# Patient Record
Sex: Male | Born: 1965 | Race: Black or African American | Hispanic: No | Marital: Married | Smoking: Former smoker
Health system: Southern US, Community
[De-identification: ages and names within clinical notes are randomized; demographics above are authoritative.]

## PROBLEM LIST (undated history)

## (undated) DIAGNOSIS — E781 Pure hyperglyceridemia: Secondary | ICD-10-CM

## (undated) DIAGNOSIS — K219 Gastro-esophageal reflux disease without esophagitis: Secondary | ICD-10-CM

## (undated) DIAGNOSIS — M1990 Unspecified osteoarthritis, unspecified site: Secondary | ICD-10-CM

## (undated) DIAGNOSIS — I10 Essential (primary) hypertension: Secondary | ICD-10-CM

## (undated) DIAGNOSIS — I2510 Atherosclerotic heart disease of native coronary artery without angina pectoris: Secondary | ICD-10-CM

## (undated) DIAGNOSIS — K859 Acute pancreatitis without necrosis or infection, unspecified: Secondary | ICD-10-CM

## (undated) DIAGNOSIS — I739 Peripheral vascular disease, unspecified: Secondary | ICD-10-CM

## (undated) DIAGNOSIS — E119 Type 2 diabetes mellitus without complications: Secondary | ICD-10-CM

## (undated) HISTORY — DX: Type 2 diabetes mellitus without complications: E11.9

## (undated) HISTORY — PX: NO PAST SURGERIES: SHX2092

## (undated) HISTORY — DX: Peripheral vascular disease, unspecified: I73.9

## (undated) HISTORY — DX: Pure hyperglyceridemia: E78.1

## (undated) HISTORY — DX: Atherosclerotic heart disease of native coronary artery without angina pectoris: I25.10

## (undated) HISTORY — DX: Essential (primary) hypertension: I10

---

## 2012-11-16 ENCOUNTER — Encounter (HOSPITAL_COMMUNITY): Payer: Self-pay | Admitting: *Deleted

## 2012-11-16 ENCOUNTER — Emergency Department (HOSPITAL_COMMUNITY)
Admission: EM | Admit: 2012-11-16 | Discharge: 2012-11-16 | Disposition: A | Discharge status: Home / Self Care | Payer: Self-pay | Attending: Emergency Medicine | Admitting: Emergency Medicine

## 2012-11-16 DIAGNOSIS — K859 Acute pancreatitis without necrosis or infection, unspecified: Secondary | ICD-10-CM | POA: Insufficient documentation

## 2012-11-16 DIAGNOSIS — F17200 Nicotine dependence, unspecified, uncomplicated: Secondary | ICD-10-CM | POA: Insufficient documentation

## 2012-11-16 DIAGNOSIS — R1110 Vomiting, unspecified: Secondary | ICD-10-CM | POA: Insufficient documentation

## 2012-11-16 HISTORY — DX: Acute pancreatitis without necrosis or infection, unspecified: K85.90

## 2012-11-16 LAB — COMPREHENSIVE METABOLIC PANEL
AST: 48 U/L — ABNORMAL HIGH (ref 0–37)
Albumin: 3.8 g/dL (ref 3.5–5.2)
Chloride: 96 mEq/L (ref 96–112)
Creatinine, Ser: 0.79 mg/dL (ref 0.50–1.35)
Sodium: 130 mEq/L — ABNORMAL LOW (ref 135–145)
Total Bilirubin: 0.5 mg/dL (ref 0.3–1.2)

## 2012-11-16 LAB — CBC WITH DIFFERENTIAL/PLATELET
Basophils Relative: 0 % (ref 0–1)
Eosinophils Absolute: 0.1 10*3/uL (ref 0.0–0.7)
HCT: 41.6 % (ref 39.0–52.0)
Hemoglobin: 14.9 g/dL (ref 13.0–17.0)
Lymphs Abs: 2.9 10*3/uL (ref 0.7–4.0)
MCH: 28.2 pg (ref 26.0–34.0)
MCHC: 35.8 g/dL (ref 30.0–36.0)
MCV: 78.6 fL (ref 78.0–100.0)
Monocytes Absolute: 0.7 10*3/uL (ref 0.1–1.0)
Neutro Abs: 6 10*3/uL (ref 1.7–7.7)

## 2012-11-16 LAB — URINALYSIS, MICROSCOPIC ONLY
Glucose, UA: NEGATIVE mg/dL
Ketones, ur: NEGATIVE mg/dL
Protein, ur: 30 mg/dL — AB

## 2012-11-16 LAB — TROPONIN I: Troponin I: <0.3 ng/mL (ref <0.30)

## 2012-11-16 LAB — LIPASE, BLOOD: Lipase: 58 U/L (ref 11–59)

## 2012-11-16 MED ORDER — OXYCODONE-ACETAMINOPHEN 10-325 MG PO TABS: 1.0000 | ORAL_TABLET | ORAL | Status: DC | PRN

## 2012-11-16 MED ORDER — HYDROMORPHONE HCL PF 1 MG/ML IJ SOLN
1.0000 mg | Freq: Once | INTRAMUSCULAR | Status: AC
  Administered 2012-11-16: 1 mg via INTRAVENOUS
  Filled 2012-11-16: qty 1

## 2012-11-16 MED ORDER — SODIUM CHLORIDE 0.9 % IV SOLN: INTRAVENOUS | Status: DC

## 2012-11-16 MED ORDER — ONDANSETRON 4 MG PO TBDP
8.0000 mg | ORAL_TABLET | Freq: Once | ORAL | Status: AC
  Administered 2012-11-16: 8 mg via ORAL
  Filled 2012-11-16: qty 2

## 2012-11-16 MED ORDER — METOCLOPRAMIDE HCL 5 MG/ML IJ SOLN
10.0000 mg | Freq: Once | INTRAMUSCULAR | Status: AC
  Administered 2012-11-16: 10 mg via INTRAVENOUS
  Filled 2012-11-16: qty 2

## 2012-11-16 MED ORDER — PROMETHAZINE HCL 25 MG PO TABS: 25.0000 mg | ORAL_TABLET | Freq: Four times a day (QID) | ORAL | Status: DC | PRN

## 2012-11-16 MED ORDER — SODIUM CHLORIDE 0.9 % IV BOLUS (SEPSIS)
1000.0000 mL | Freq: Once | INTRAVENOUS | Status: AC
  Administered 2012-11-16: 1000 mL via INTRAVENOUS

## 2012-11-16 MED ORDER — LORAZEPAM 2 MG/ML IJ SOLN
1.0000 mg | Freq: Once | INTRAMUSCULAR | Status: AC
  Administered 2012-11-16: 1 mg via INTRAVENOUS
  Filled 2012-11-16: qty 1

## 2012-11-16 NOTE — ED Notes (Signed)
Reports abd pain and n/v that started this am. Denies diarrhea. Hx of pancreatitis. Vomiting at triage.

## 2012-11-16 NOTE — ED Notes (Signed)
EKG completed and given to Dr. Fonnie Jarvis. No OLD ekg.

## 2012-11-16 NOTE — ED Provider Notes (Signed)
History     CSN: [id number]  Arrival date & time 11/16/12  1043   First MD Initiated Contact with Patient 11/16/12 1204      Chief Complaint  Patient presents with  . Abdominal Pain  . Emesis    (Consider location/radiation/quality/duration/timing/severity/associated sxs/prior treatment) HPI This 46 year old male is new to the area with no doctor here yet he just moved here within last month, he has a history of recurrent pancreatitis, he has one to 2 flareups a year typically last for a day or 2, he stop drinking alcohol years ago when he had his first episode of pancreatitis, he has a flareup of pain since yesterday with multiple spells of nonbloody vomiting with no diarrhea, he had a normal bowel movement yesterday, he is no chest pain cough shortness breath fever confusion, he noticed that his nausea and pain control to get back home today if possible. This is a typical episode of epigastric pain radiating to his back with vomiting without Toradol pain or dysuria. There is no treatment prior to arrival. His pain is moderately severe. Past Medical History  Diagnosis Date  . Pancreatitis     History reviewed. No pertinent past surgical history.  History reviewed. No pertinent family history.  History  Substance Use Topics  . Smoking status: Current Every Day Smoker    Types: Cigarettes  . Smokeless tobacco: Not on file  . Alcohol Use: No      Review of Systems 10 Systems reviewed and are negative for acute change except as noted in the HPI. Allergies  Review of patient's allergies indicates no known allergies.  Home Medications   Current Outpatient Rx  Name  Route  Sig  Dispense  Refill  . OMEPRAZOLE 40 MG PO CPDR   Oral   Take 40 mg by mouth daily as needed. When stomach acts up         . OXYCODONE-ACETAMINOPHEN 10-650 MG PO TABS   Oral   Take 1 tablet by mouth daily as needed. For pain         . PROMETHAZINE HCL 25 MG PO TABS   Oral   Take 25 mg by  mouth every 6 (six) hours as needed. For nausea         . OXYCODONE-ACETAMINOPHEN 10-325 MG PO TABS   Oral   Take 1 tablet by mouth every 4 (four) hours as needed for pain.   15 tablet   0   . PROMETHAZINE HCL 25 MG PO TABS   Oral   Take 1 tablet (25 mg total) by mouth every 6 (six) hours as needed for nausea.   15 tablet   0     BP 168/105  Pulse 80  Temp 98.3 F (36.8 C) (Oral)  Resp 23  SpO2 94%  Physical Exam  Nursing note and vitals reviewed. Constitutional:       Awake, alert, nontoxic appearance.  HENT:  Head: Atraumatic.  Eyes: Right eye exhibits no discharge. Left eye exhibits no discharge.  Neck: Neck supple.  Cardiovascular: Normal rate and regular rhythm.   No murmur heard. Pulmonary/Chest: Effort normal and breath sounds normal. No respiratory distress. He has no wheezes. He has no rales. He exhibits no tenderness.  Abdominal: Soft. Bowel sounds are normal. He exhibits no distension and no mass. There is tenderness. There is guarding. There is no rebound.       Moderate epigastric tenderness with the rest of the abdomen nontender there is no rebound  Musculoskeletal: He exhibits no tenderness.       Baseline ROM, no obvious new focal weakness.  Neurological: He is alert.       Mental status and motor strength appears baseline for patient and situation.  Skin: No rash noted.  Psychiatric: He has a normal mood and affect.    ED Course  Procedures (including critical care time) ECG: Sinus rhythm, ventricular rate 79, normal axis, inverted T waves inferiorly, nonspecific lateral T wave abnormality, no comparison ECG available  Medical screening examination/treatment/procedure(s) were conducted as a shared visit with non-physician practitioner(s) and myself.  I personally evaluated the patient during the encounter. Patient will moved to the CDU with labs pending and disposition pending  Labs Reviewed  URINALYSIS, MICROSCOPIC ONLY - Abnormal; Notable for  the following:    Color, Urine AMBER (*)  BIOCHEMICALS MAY BE AFFECTED BY COLOR   APPearance CLOUDY (*)     Hgb urine dipstick SMALL (*)     Bilirubin Urine SMALL (*)     Protein, ur 30 (*)     All other components within normal limits  COMPREHENSIVE METABOLIC PANEL - Abnormal; Notable for the following:    Sodium 130 (*)  POST-ULTRACENTRIFUGATION   Potassium 5.4 (*)     Glucose, Bld 104 (*)  POST-ULTRACENTRIFUGATION   AST 48 (*)  POST-ULTRACENTRIFUGATION   Alkaline Phosphatase 31 (*)  POST-ULTRACENTRIFUGATION   All other components within normal limits  CBC WITH DIFFERENTIAL  TROPONIN I  LIPASE, BLOOD   No results found.   1. Pancreatitis       MDM  Dispo pnd.        Hurman Horn, MD 11/16/12 2113

## 2012-11-16 NOTE — ED Provider Notes (Signed)
Pt is comfortable.  Awaiting CMP and lipase levels.  Once those levels return, plan to d/c home with pain meds and nausea meds.  Discussed clear fluids till pain is completely relieved.  Wife and pt voice understanding.  Jordan [name] [place], [place] 11/16/12 1404  Reviewed labs with Dr. Fonnie Jarvis.  Plan to send pt home with pain meds and nausea medications.  Reviewed prevention signs and sx.  F/u with pcp next week.  Jordan [name], [place] 11/16/12 [phone]

## 2013-12-27 ENCOUNTER — Encounter (HOSPITAL_COMMUNITY): Payer: Self-pay | Admitting: Emergency Medicine

## 2013-12-27 ENCOUNTER — Emergency Department (HOSPITAL_COMMUNITY)
Admission: EM | Admit: 2013-12-27 | Discharge: 2013-12-27 | Disposition: A | Discharge status: Home / Self Care | Payer: 59 | Attending: Emergency Medicine | Admitting: Emergency Medicine

## 2013-12-27 DIAGNOSIS — F17200 Nicotine dependence, unspecified, uncomplicated: Secondary | ICD-10-CM | POA: Insufficient documentation

## 2013-12-27 DIAGNOSIS — K859 Acute pancreatitis without necrosis or infection, unspecified: Secondary | ICD-10-CM

## 2013-12-27 DIAGNOSIS — R630 Anorexia: Secondary | ICD-10-CM | POA: Insufficient documentation

## 2013-12-27 LAB — CBC WITH DIFFERENTIAL/PLATELET
BASOS ABS: 0 10*3/uL (ref 0.0–0.1)
BASOS PCT: 0 % (ref 0–1)
EOS ABS: 0.1 10*3/uL (ref 0.0–0.7)
EOS PCT: 1 % (ref 0–5)
HEMATOCRIT: 34.7 % — AB (ref 39.0–52.0)
HEMOGLOBIN: 12.1 g/dL — AB (ref 13.0–17.0)
Lymphocytes Relative: 31 % (ref 12–46)
Lymphs Abs: 2.8 10*3/uL (ref 0.7–4.0)
MCH: 28.2 pg (ref 26.0–34.0)
MCHC: 34.9 g/dL (ref 30.0–36.0)
MCV: 80.9 fL (ref 78.0–100.0)
MONO ABS: 0.5 10*3/uL (ref 0.1–1.0)
MONOS PCT: 5 % (ref 3–12)
Neutro Abs: 5.7 10*3/uL (ref 1.7–7.7)
Neutrophils Relative %: 63 % (ref 43–77)
Platelets: 270 10*3/uL (ref 150–400)
RBC: 4.29 MIL/uL (ref 4.22–5.81)
RDW: 14.9 % (ref 11.5–15.5)
WBC: 9 10*3/uL (ref 4.0–10.5)

## 2013-12-27 LAB — COMPREHENSIVE METABOLIC PANEL
ALBUMIN: 4 g/dL (ref 3.5–5.2)
ALT: <5 U/L (ref 0–53)
AST: <5 U/L (ref 0–37)
Alkaline Phosphatase: 37 U/L — ABNORMAL LOW (ref 39–117)
BILIRUBIN TOTAL: 0.3 mg/dL (ref 0.3–1.2)
BUN: 11 mg/dL (ref 6–23)
CALCIUM: 8.7 mg/dL (ref 8.4–10.5)
CO2: 23 mEq/L (ref 19–32)
CREATININE: 0.74 mg/dL (ref 0.50–1.35)
Chloride: 94 mEq/L — ABNORMAL LOW (ref 96–112)
GFR calc Af Amer: >90 mL/min (ref >90)
GFR calc non Af Amer: >90 mL/min (ref >90)
Glucose, Bld: 114 mg/dL — ABNORMAL HIGH (ref 70–99)
Potassium: 4.8 mEq/L (ref 3.7–5.3)
Sodium: 131 mEq/L — ABNORMAL LOW (ref 137–147)
TOTAL PROTEIN: 7.9 g/dL (ref 6.0–8.3)

## 2013-12-27 LAB — LIPASE, BLOOD: LIPASE: 96 U/L — AB (ref 11–59)

## 2013-12-27 MED ORDER — SODIUM CHLORIDE 0.9 % IV BOLUS (SEPSIS)
1000.0000 mL | Freq: Once | INTRAVENOUS | Status: AC
  Administered 2013-12-27: 1000 mL via INTRAVENOUS

## 2013-12-27 MED ORDER — ONDANSETRON HCL 4 MG PO TABS: 4.0000 mg | ORAL_TABLET | Freq: Four times a day (QID) | ORAL | Status: DC

## 2013-12-27 MED ORDER — HYDROMORPHONE HCL PF 1 MG/ML IJ SOLN
1.0000 mg | Freq: Once | INTRAMUSCULAR | Status: AC
  Administered 2013-12-27: 1 mg via INTRAVENOUS
  Filled 2013-12-27: qty 1

## 2013-12-27 MED ORDER — OXYCODONE-ACETAMINOPHEN 5-325 MG PO TABS: 1.0000 | ORAL_TABLET | Freq: Four times a day (QID) | ORAL | Status: DC | PRN

## 2013-12-27 MED ORDER — HYDROCODONE-ACETAMINOPHEN 5-325 MG PO TABS
2.0000 | ORAL_TABLET | Freq: Once | ORAL | Status: AC
  Administered 2013-12-27: 2 via ORAL
  Filled 2013-12-27: qty 2

## 2013-12-27 MED ORDER — ONDANSETRON HCL 4 MG/2ML IJ SOLN
4.0000 mg | Freq: Once | INTRAMUSCULAR | Status: AC
  Administered 2013-12-27: 4 mg via INTRAVENOUS
  Filled 2013-12-27: qty 2

## 2013-12-27 NOTE — ED Provider Notes (Signed)
CSN: [id number]     Arrival date & time 12/27/13  1635 History   First MD Initiated Contact with Patient 12/27/13 1649     Chief Complaint  Patient presents with  . Pancreatitis   (Consider location/radiation/quality/duration/timing/severity/associated sxs/prior Treatment) Patient is a 48 y.o. male presenting with abdominal pain. The history is provided by the patient. No language interpreter was used.  Abdominal Pain Pain location:  Epigastric Pain quality: sharp   Pain radiates to:  Back Pain severity:  Severe Onset quality:  Sudden Duration:  9 hours Timing:  Constant Progression:  Worsening Chronicity:  Recurrent Context: suspicious food intake   Context comment:  Chinese food last night Relieved by:  Nothing Worsened by:  Eating Ineffective treatments:  None tried Associated symptoms: anorexia, nausea and vomiting   Associated symptoms: no chest pain, no chills, no constipation, no cough, no diarrhea, no dysuria, no fatigue, no fever, no hematemesis, no hematochezia, no hematuria, no melena and no shortness of breath   Vomiting:    Quality:  Stomach contents   Number of occurrences:  >10   Severity:  Severe   Duration:  9 hours   Timing:  Intermittent   Progression:  Unchanged Risk factors comment:  Hx of pancreatitis.  Last flare about 1.5 years ago   Past Medical History  Diagnosis Date  . Pancreatitis    History reviewed. No pertinent past surgical history. No family history on file. History  Substance Use Topics  . Smoking status: Current Every Day Smoker    Types: Cigarettes  . Smokeless tobacco: Not on file  . Alcohol Use: No    Review of Systems  Constitutional: Negative for fever, chills, activity change, appetite change and fatigue.  HENT: Negative for congestion, facial swelling, rhinorrhea and trouble swallowing.   Eyes: Negative for photophobia and pain.  Respiratory: Negative for cough, chest tightness and shortness of breath.   Cardiovascular:  Negative for chest pain and leg swelling.  Gastrointestinal: Positive for nausea, vomiting, abdominal pain and anorexia. Negative for diarrhea, constipation, melena, hematochezia and hematemesis.  Endocrine: Negative for polydipsia and polyuria.  Genitourinary: Negative for dysuria, urgency, hematuria, decreased urine volume and difficulty urinating.  Musculoskeletal: Negative for back pain and gait problem.  Skin: Negative for color change, rash and wound.  Allergic/Immunologic: Negative for immunocompromised state.  Neurological: Negative for dizziness, facial asymmetry, speech difficulty, weakness, numbness and headaches.  Psychiatric/Behavioral: Negative for confusion, decreased concentration and agitation.    Allergies  Review of patient's allergies indicates no known allergies.  Home Medications   Current Outpatient Rx  Name  Route  Sig  Dispense  Refill  . omeprazole (PRILOSEC) 40 MG capsule   Oral   Take 40 mg by mouth daily as needed. When stomach acts up         . promethazine (PHENERGAN) 25 MG tablet   Oral   Take 25 mg by mouth every 6 (six) hours as needed. For nausea         . ondansetron (ZOFRAN) 4 MG tablet   Oral   Take 1 tablet (4 mg total) by mouth every 6 (six) hours.   20 tablet   0   . oxyCODONE-acetaminophen (PERCOCET/ROXICET) 5-325 MG per tablet   Oral   Take 1 tablet by mouth every 6 (six) hours as needed for severe pain.   20 tablet   0    BP 99/81  Pulse 67  Temp(Src) 99.3 F (37.4 C) (Oral)  Resp 17  Ht  5' 5"  (1.651 m)  Wt 185 lb (83.915 kg)  BMI 30.79 kg/m2  SpO2 96% Physical Exam  Constitutional: He is oriented to person, place, and time. He appears well-developed and well-nourished. No distress.  HENT:  Head: Normocephalic and atraumatic.  Mouth/Throat: No oropharyngeal exudate.  Eyes: Pupils are equal, round, and reactive to light.  Neck: Normal range of motion. Neck supple.  Cardiovascular: Normal rate, regular rhythm and  normal heart sounds.  Exam reveals no gallop and no friction rub.   No murmur heard. Pulmonary/Chest: Effort normal and breath sounds normal. No respiratory distress. He has no wheezes. He has no rales.  Abdominal: Soft. Bowel sounds are normal. He exhibits no distension and no mass. There is tenderness in the epigastric area. There is no rigidity, no rebound and no guarding.  Musculoskeletal: Normal range of motion. He exhibits no edema and no tenderness.  Neurological: He is alert and oriented to person, place, and time.  Skin: Skin is warm and dry.  Psychiatric: He has a normal mood and affect.    ED Course  Procedures (including critical care time) Labs Review Labs Reviewed  CBC WITH DIFFERENTIAL - Abnormal; Notable for the following:    Hemoglobin 12.1 (*)    HCT 34.7 (*)    All other components within normal limits  COMPREHENSIVE METABOLIC PANEL - Abnormal; Notable for the following:    Sodium 131 (*)    Chloride 94 (*)    Glucose, Bld 114 (*)    Alkaline Phosphatase 37 (*)    All other components within normal limits  LIPASE, BLOOD - Abnormal; Notable for the following:    Lipase 96 (*)    All other components within normal limits   Imaging Review No results found.  EKG Interpretation   None       MDM   1. Pancreatitis    Pt is a 48 y.o. male with Pmhx as above who presents with constant epigastric pain w/ radiation to back, assoc n/v for about 9 hours, similar to prior episodes of pancreatitis.  On PE, VSS, pt is uncomfortable, but in NAD. W/U shows lipase of 96.  Alk phos 37.  I believe presentation is c/w pancreatitis.  If pt does not improve w/ symptomatic tx, will consider CT imaging.      8:18 PM Pt feeling much improved after 2 doses of IV dilaudid and 1 dose PO norco and is tolerating PO.  He would like to try to continue symptomatic care at home.  Doubt acute surgical cause of pain such as cholecystitis, SBO, gastric perforation.  Rx for percocet & zofran  given. Return precautions given for new or worsening symptoms including uncontrolled pain, uncontrolled emesis or inability to tolerate liquids.       Neta Ehlers, MD 12/28/13 1340

## 2013-12-27 NOTE — Discharge Instructions (Signed)
Acute Pancreatitis °Acute pancreatitis is a disease in which the pancreas becomes suddenly inflamed. The pancreas is a large gland located behind your stomach. The pancreas produces enzymes that help digest food. The pancreas also releases the hormones glucagon and insulin that help regulate blood sugar. Damage to the pancreas occurs when the digestive enzymes from the pancreas are activated and begin attacking the pancreas before being released into the intestine. Most acute attacks last a couple of days and can cause serious complications. Some people become dehydrated and develop low blood pressure. In severe cases, bleeding into the pancreas can lead to shock and can be life-threatening. The lungs, heart, and kidneys may fail. °CAUSES  °Pancreatitis can happen to anyone. In some cases, the cause is unknown. Most cases are caused by: °· Alcohol abuse. °· Gallstones. °Other less common causes are: °· Certain medicines. °· Exposure to certain chemicals. °· Infection. °· Damage caused by an accident (trauma). °· Abdominal surgery. °SYMPTOMS  °· Pain in the upper abdomen that may radiate to the back. °· Tenderness and swelling of the abdomen. °· Nausea and vomiting. °DIAGNOSIS  °Your caregiver will perform a physical exam. Blood and stool tests may be done to confirm the diagnosis. Imaging tests may also be done, such as X-rays, CT scans, or an ultrasound of the abdomen. °TREATMENT  °Treatment usually requires a stay in the hospital. Treatment may include: °· Pain medicine. °· Fluid replacement through an intravenous line (IV). °· Placing a tube in the stomach to remove stomach contents and control vomiting. °· Not eating for 3 or 4 days. This gives your pancreas a rest, because enzymes are not being produced that can cause further damage. °· Antibiotic medicines if your condition is caused by an infection. °· Surgery of the pancreas or gallbladder. °HOME CARE INSTRUCTIONS  °· Follow the diet advised by your  caregiver. This may involve avoiding alcohol and decreasing the amount of fat in your diet. °· Eat smaller, more frequent meals. This reduces the amount of digestive juices the pancreas produces. °· Drink enough fluids to keep your urine clear or pale yellow. °· Only take over-the-counter or prescription medicines as directed by your caregiver. °· Avoid drinking alcohol if it caused your condition. °· Do not smoke. °· Get plenty of rest. °· Check your blood sugar at home as directed by your caregiver. °· Keep all follow-up appointments as directed by your caregiver. °SEEK MEDICAL CARE IF:  °· You do not recover as quickly as expected. °· You develop new or worsening symptoms. °· You have persistent pain, weakness, or nausea. °· You recover and then have another episode of pain. °SEEK IMMEDIATE MEDICAL CARE IF:  °· You are unable to eat or keep fluids down. °· Your pain becomes severe. °· You have a fever or persistent symptoms for more than 2 to 3 days. °· You have a fever and your symptoms suddenly get worse. °· Your skin or the white part of your eyes turn yellow (jaundice). °· You develop vomiting. °· You feel dizzy, or you faint. °· Your blood sugar is high (over 300 mg/dL). °MAKE SURE YOU:  °· Understand these instructions. °· Will watch your condition. °· Will get help right away if you are not doing well or get worse. °Document Released: 11/06/2005 Document Revised: 05/07/2012 Document Reviewed: 02/15/2012 °[employer]® Patient Information ©2014 [employer]. ° °

## 2013-12-27 NOTE — ED Notes (Signed)
Pt tolerating PO fluids

## 2013-12-27 NOTE — ED Notes (Signed)
Pt reports abdominal pain since 0900 this morning, believes its pancreatitis flare up. N/v/d since this morning. Has not taken any pain med because he is out. Pt is a 6.

## 2013-12-29 ENCOUNTER — Inpatient Hospital Stay (HOSPITAL_COMMUNITY): Payer: 59

## 2013-12-29 ENCOUNTER — Inpatient Hospital Stay (HOSPITAL_COMMUNITY)
Admission: EM | Admit: 2013-12-29 | Discharge: 2013-12-31 | DRG: 439 | Disposition: A | Discharge status: Home / Self Care | Payer: 59 | Attending: Internal Medicine | Admitting: Internal Medicine

## 2013-12-29 ENCOUNTER — Encounter (HOSPITAL_COMMUNITY): Payer: Self-pay | Admitting: Emergency Medicine

## 2013-12-29 DIAGNOSIS — K920 Hematemesis: Secondary | ICD-10-CM | POA: Diagnosis present

## 2013-12-29 DIAGNOSIS — K219 Gastro-esophageal reflux disease without esophagitis: Secondary | ICD-10-CM | POA: Diagnosis present

## 2013-12-29 DIAGNOSIS — Z720 Tobacco use: Secondary | ICD-10-CM | POA: Diagnosis present

## 2013-12-29 DIAGNOSIS — K861 Other chronic pancreatitis: Secondary | ICD-10-CM | POA: Diagnosis present

## 2013-12-29 DIAGNOSIS — R112 Nausea with vomiting, unspecified: Secondary | ICD-10-CM

## 2013-12-29 DIAGNOSIS — F17200 Nicotine dependence, unspecified, uncomplicated: Secondary | ICD-10-CM | POA: Diagnosis present

## 2013-12-29 DIAGNOSIS — K859 Acute pancreatitis without necrosis or infection, unspecified: Principal | ICD-10-CM | POA: Diagnosis present

## 2013-12-29 DIAGNOSIS — E781 Pure hyperglyceridemia: Secondary | ICD-10-CM | POA: Diagnosis present

## 2013-12-29 DIAGNOSIS — D72829 Elevated white blood cell count, unspecified: Secondary | ICD-10-CM

## 2013-12-29 DIAGNOSIS — K7689 Other specified diseases of liver: Secondary | ICD-10-CM | POA: Diagnosis present

## 2013-12-29 DIAGNOSIS — R109 Unspecified abdominal pain: Secondary | ICD-10-CM

## 2013-12-29 HISTORY — DX: Acute pancreatitis without necrosis or infection, unspecified: K85.90

## 2013-12-29 LAB — CBC WITH DIFFERENTIAL/PLATELET
BASOS ABS: 0 10*3/uL (ref 0.0–0.1)
Basophils Relative: 0 % (ref 0–1)
Eosinophils Absolute: 0.1 10*3/uL (ref 0.0–0.7)
Eosinophils Relative: 1 % (ref 0–5)
HEMATOCRIT: 43.4 % (ref 39.0–52.0)
HEMOGLOBIN: 15.6 g/dL (ref 13.0–17.0)
LYMPHS ABS: 2 10*3/uL (ref 0.7–4.0)
LYMPHS PCT: 12 % (ref 12–46)
MCH: 28.2 pg (ref 26.0–34.0)
MCHC: 35.9 g/dL (ref 30.0–36.0)
MCV: 78.5 fL (ref 78.0–100.0)
MONO ABS: 1.5 10*3/uL — AB (ref 0.1–1.0)
Monocytes Relative: 9 % (ref 3–12)
NEUTROS ABS: 13.5 10*3/uL — AB (ref 1.7–7.7)
Neutrophils Relative %: 79 % — ABNORMAL HIGH (ref 43–77)
Platelets: 301 10*3/uL (ref 150–400)
RBC: 5.53 MIL/uL (ref 4.22–5.81)
RDW: 13.9 % (ref 11.5–15.5)
WBC: 17.2 10*3/uL — AB (ref 4.0–10.5)

## 2013-12-29 LAB — COMPREHENSIVE METABOLIC PANEL
ALK PHOS: 49 U/L (ref 39–117)
ALT: 23 U/L (ref 0–53)
ALT: 18 U/L (ref 0–53)
AST: 38 U/L — AB (ref 0–37)
AST: 22 U/L (ref 0–37)
Albumin: 3.9 g/dL (ref 3.5–5.2)
Albumin: 3.4 g/dL — ABNORMAL LOW (ref 3.5–5.2)
Alkaline Phosphatase: 42 U/L (ref 39–117)
BILIRUBIN TOTAL: 0.8 mg/dL (ref 0.3–1.2)
BUN: 9 mg/dL (ref 6–23)
BUN: 10 mg/dL (ref 6–23)
CHLORIDE: 93 meq/L — AB (ref 96–112)
CO2: 18 meq/L — AB (ref 19–32)
CO2: 20 mEq/L (ref 19–32)
CREATININE: 0.66 mg/dL (ref 0.50–1.35)
Calcium: 9.5 mg/dL (ref 8.4–10.5)
Calcium: 9.2 mg/dL (ref 8.4–10.5)
Chloride: 95 mEq/L — ABNORMAL LOW (ref 96–112)
Creatinine, Ser: 0.79 mg/dL (ref 0.50–1.35)
GFR calc Af Amer: >90 mL/min (ref >90)
GFR calc non Af Amer: >90 mL/min (ref >90)
GLUCOSE: 108 mg/dL — AB (ref 70–99)
Glucose, Bld: 131 mg/dL — ABNORMAL HIGH (ref 70–99)
POTASSIUM: 4.2 meq/L (ref 3.7–5.3)
Potassium: 4.9 mEq/L (ref 3.7–5.3)
SODIUM: 132 meq/L — AB (ref 137–147)
Sodium: 130 mEq/L — ABNORMAL LOW (ref 137–147)
TOTAL PROTEIN: 8.1 g/dL (ref 6.0–8.3)
Total Bilirubin: 0.9 mg/dL (ref 0.3–1.2)
Total Protein: 8.9 g/dL — ABNORMAL HIGH (ref 6.0–8.3)

## 2013-12-29 LAB — CBC
HCT: 40.7 % (ref 39.0–52.0)
HEMOGLOBIN: 14.6 g/dL (ref 13.0–17.0)
MCH: 28.2 pg (ref 26.0–34.0)
MCHC: 35.9 g/dL (ref 30.0–36.0)
MCV: 78.6 fL (ref 78.0–100.0)
Platelets: 260 10*3/uL (ref 150–400)
RBC: 5.18 MIL/uL (ref 4.22–5.81)
RDW: 13.9 % (ref 11.5–15.5)
WBC: 16.2 10*3/uL — ABNORMAL HIGH (ref 4.0–10.5)

## 2013-12-29 LAB — URINALYSIS, ROUTINE W REFLEX MICROSCOPIC
GLUCOSE, UA: NEGATIVE mg/dL
KETONES UR: 40 mg/dL — AB
Leukocytes, UA: NEGATIVE
Nitrite: NEGATIVE
PROTEIN: 100 mg/dL — AB
Specific Gravity, Urine: 1.028 (ref 1.005–1.030)
Urobilinogen, UA: 0.2 mg/dL (ref 0.0–1.0)
pH: 5.5 (ref 5.0–8.0)

## 2013-12-29 LAB — LIPID PANEL
Cholesterol: 300 mg/dL — ABNORMAL HIGH (ref 0–200)
HDL: 20 mg/dL — ABNORMAL LOW (ref >39)
LDL CALC: UNDETERMINED mg/dL (ref 0–99)
TRIGLYCERIDES: 1035 mg/dL — AB (ref <150)
Total CHOL/HDL Ratio: 15 RATIO
VLDL: UNDETERMINED mg/dL (ref 0–40)

## 2013-12-29 LAB — URINE MICROSCOPIC-ADD ON

## 2013-12-29 LAB — LIPASE, BLOOD
LIPASE: 472 U/L — AB (ref 11–59)
Lipase: 519 U/L — ABNORMAL HIGH (ref 11–59)

## 2013-12-29 MED ORDER — ONDANSETRON HCL 4 MG/2ML IJ SOLN
4.0000 mg | Freq: Four times a day (QID) | INTRAMUSCULAR | Status: DC | PRN
  Administered 2013-12-29 – 2013-12-31 (×2): 4 mg via INTRAVENOUS
  Filled 2013-12-29 (×2): qty 2

## 2013-12-29 MED ORDER — ONDANSETRON HCL 4 MG PO TABS: 4.0000 mg | ORAL_TABLET | Freq: Four times a day (QID) | ORAL | Status: DC | PRN

## 2013-12-29 MED ORDER — HYDROMORPHONE HCL PF 1 MG/ML IJ SOLN
INTRAMUSCULAR | Status: AC
  Filled 2013-12-29: qty 2

## 2013-12-29 MED ORDER — NICOTINE 21 MG/24HR TD PT24
21.0000 mg | MEDICATED_PATCH | TRANSDERMAL | Status: DC
  Administered 2013-12-29 – 2013-12-31 (×3): 21 mg via TRANSDERMAL
  Filled 2013-12-29 (×4): qty 1

## 2013-12-29 MED ORDER — HYDROMORPHONE HCL PF 1 MG/ML IJ SOLN
2.0000 mg | INTRAMUSCULAR | Status: DC | PRN
  Administered 2013-12-29 – 2013-12-31 (×12): 2 mg via INTRAVENOUS
  Filled 2013-12-29 (×11): qty 2

## 2013-12-29 MED ORDER — ACETAMINOPHEN 325 MG PO TABS: 650.0000 mg | ORAL_TABLET | Freq: Four times a day (QID) | ORAL | Status: DC | PRN

## 2013-12-29 MED ORDER — ACETAMINOPHEN 650 MG RE SUPP: 650.0000 mg | Freq: Four times a day (QID) | RECTAL | Status: DC | PRN

## 2013-12-29 MED ORDER — MORPHINE SULFATE 4 MG/ML IJ SOLN
4.0000 mg | Freq: Once | INTRAMUSCULAR | Status: AC
  Administered 2013-12-29: 4 mg via INTRAVENOUS
  Filled 2013-12-29: qty 1

## 2013-12-29 MED ORDER — ONDANSETRON HCL 4 MG/2ML IJ SOLN
4.0000 mg | Freq: Once | INTRAMUSCULAR | Status: AC
  Administered 2013-12-29: 4 mg via INTRAVENOUS
  Filled 2013-12-29: qty 2

## 2013-12-29 MED ORDER — PANTOPRAZOLE SODIUM 40 MG PO TBEC
80.0000 mg | DELAYED_RELEASE_TABLET | Freq: Every day | ORAL | Status: DC
Start: 2013-12-29 — End: 2013-12-31
  Administered 2013-12-29 – 2013-12-31 (×3): 80 mg via ORAL
  Filled 2013-12-29 (×3): qty 2

## 2013-12-29 MED ORDER — FENOFIBRATE 160 MG PO TABS
160.0000 mg | ORAL_TABLET | Freq: Every day | ORAL | Status: DC
  Administered 2013-12-29 – 2013-12-31 (×3): 160 mg via ORAL
  Filled 2013-12-29 (×3): qty 1

## 2013-12-29 MED ORDER — SODIUM CHLORIDE 0.9 % IV SOLN
INTRAVENOUS | Status: AC
  Administered 2013-12-29 – 2013-12-31 (×6): via INTRAVENOUS

## 2013-12-29 NOTE — ED Notes (Signed)
Pt is aware we need a urine

## 2013-12-29 NOTE — Progress Notes (Addendum)
Patient seen and examined this morning. Presenting with acute on chronic pancreatitis, elevated lipase. Awaiting abdominal US, lipid panel.  - NPO for today, IVF, pain control. - significant elevation of TG, start fibrate.  Costin M. Elvera LennoxGherghe, MD [facility] [phone]

## 2013-12-29 NOTE — ED Notes (Signed)
Pt states he was seen in ED on Saturday for pancreatitis.  C/o increased abd pain and nausea.  Denies vomiting and diarrhea.

## 2013-12-29 NOTE — ED Notes (Signed)
Consulting hospitalist MD at bedside.

## 2013-12-29 NOTE — ED Provider Notes (Signed)
CSN: [id number]     Arrival date & time 12/29/13  0151 History   First MD Initiated Contact with Patient 12/29/13 0255     Chief Complaint  Patient presents with  . Pancreatitis   (Consider location/radiation/quality/duration/timing/severity/associated sxs/prior Treatment) Patient is a 48 y.o. male presenting with abdominal pain. The history is provided by the patient. No language interpreter was used.  Abdominal Pain Pain location:  Periumbilical Pain radiates to:  Back Pain severity:  Severe Onset quality:  Gradual Timing:  Constant Progression:  Worsening Chronicity:  Recurrent Context: not diet changes and not trauma   Context comment:  Has not had alcohol in 1 week Relieved by:  Nothing Worsened by:  Nothing tried Ineffective treatments:  None tried Associated symptoms: vomiting   Associated symptoms: no chest pain   Risk factors: no aspirin use     Past Medical History  Diagnosis Date  . Pancreatitis    History reviewed. No pertinent past surgical history. No family history on file. History  Substance Use Topics  . Smoking status: Current Every Day Smoker    Types: Cigarettes  . Smokeless tobacco: Not on file  . Alcohol Use: No    Review of Systems  Cardiovascular: Negative for chest pain.  Gastrointestinal: Positive for vomiting and abdominal pain.  All other systems reviewed and are negative.    Allergies  Review of patient's allergies indicates no known allergies.  Home Medications   Current Outpatient Rx  Name  Route  Sig  Dispense  Refill  . ondansetron (ZOFRAN) 4 MG tablet   Oral   Take 1 tablet (4 mg total) by mouth every 6 (six) hours.   20 tablet   0   . oxyCODONE-acetaminophen (PERCOCET/ROXICET) 5-325 MG per tablet   Oral   Take 1 tablet by mouth every 6 (six) hours as needed for severe pain.   20 tablet   0   . promethazine (PHENERGAN) 25 MG tablet   Oral   Take 25 mg by mouth every 6 (six) hours as needed. For nausea         .  omeprazole (PRILOSEC) 40 MG capsule   Oral   Take 40 mg by mouth daily as needed. When stomach acts up          BP 153/95  Pulse 108  Temp(Src) 98.4 F (36.9 C) (Oral)  Resp 20  Wt 185 lb (83.915 kg)  SpO2 96% Physical Exam  Constitutional: He is oriented to person, place, and time. He appears well-developed and well-nourished. No distress.  HENT:  Head: Normocephalic and atraumatic.  Mouth/Throat: Oropharynx is clear and moist.  Eyes: Conjunctivae are normal. Pupils are equal, round, and reactive to light.  Neck: Normal range of motion. Neck supple.  Cardiovascular: Normal rate, regular rhythm and intact distal pulses.   Pulmonary/Chest: Effort normal and breath sounds normal. He has no wheezes. He has no rales.  Abdominal: Soft. Bowel sounds are normal. There is tenderness. There is no rebound and no guarding.  Musculoskeletal: Normal range of motion.  Neurological: He is alert and oriented to person, place, and time.  Skin: Skin is warm and dry. No erythema.  Psychiatric: He has a normal mood and affect.    ED Course  Procedures (including critical care time) Labs Review Labs Reviewed  CBC WITH DIFFERENTIAL - Abnormal; Notable for the following:    WBC 17.2 (*)    Neutrophils Relative % 79 (*)    Neutro Abs 13.5 (*)  Monocytes Absolute 1.5 (*)    All other components within normal limits  COMPREHENSIVE METABOLIC PANEL - Abnormal; Notable for the following:    Sodium 130 (*)    Chloride 93 (*)    CO2 18 (*)    Glucose, Bld 131 (*)    Total Protein 8.9 (*)    All other components within normal limits  LIPASE, BLOOD - Abnormal; Notable for the following:    Lipase 519 (*)    All other components within normal limits  URINALYSIS, ROUTINE W REFLEX MICROSCOPIC   Imaging Review No results found.  EKG Interpretation   None       MDM  No diagnosis found. Pancreatitis, failed outpatient management    Breean Nannini K Cam Dauphin-Rasch, MD 12/29/13 [phone]

## 2013-12-29 NOTE — ED Notes (Signed)
MD at bedside. 

## 2013-12-29 NOTE — H&P (Signed)
Patient's PCP: Pcp Not In System  Chief Complaint: Abdominal pain  History of Present Illness: Jordan [name] is a 48 y.o. African American male with history of recurrent pancreatitis, and GERD who presents with the above complaints.  Patient in the past has had multiple episodes of pancreatitis, his most recent episode was in December of 2013, previously has had pancreatitis in [place].  His recent episode of pancreatitis started on 12/27/2013, he initially presents to the emergency department after pain medications he felt better and decided to go home with conservative management.  At home he has had continued pain and could not get any relief.  He presented back to the emergency department and was found to have lipase of 519.  Hospitalist service was asked to admit the patient for pancreatitis.  He denies any recent fevers, chills, chest pain, shortness of breath, headaches or vision changes.  He has been nauseated and vomiting at home.  Reports vomiting small amount of blood.  Has had diarrhea at home yesterday.  Review of Systems: All systems reviewed with the patient and positive as per history of present illness, otherwise all other systems are negative.  Past Medical History  Diagnosis Date  . Pancreatitis    History reviewed. No pertinent past surgical history. Family History  Problem Relation Age of Onset  . Other Mother     Healthy  . Other Father     Healthy   History   Social History  . Marital Status: Married    Spouse Name: N/A    Number of Children: N/A  . Years of Education: N/A   Occupational History  . Not on file.   Social History Main Topics  . Smoking status: Current Every Day Smoker -- 1.00 packs/day    Types: Cigarettes  . Smokeless tobacco: Not on file  . Alcohol Use: No  . Drug Use: No  . Sexual Activity: Not on file   Other Topics Concern  . Not on file   Social History Narrative  . No narrative on file   Allergies: Review of patient's  allergies indicates no known allergies.  Home Meds: Prior to Admission medications   Medication Sig Start Date End Date Taking? Authorizing Provider  ondansetron (ZOFRAN) 4 MG tablet Take 1 tablet (4 mg total) by mouth every 6 (six) hours. 12/27/13  Yes Shanna Cisco, MD  oxyCODONE-acetaminophen (PERCOCET/ROXICET) 5-325 MG per tablet Take 1 tablet by mouth every 6 (six) hours as needed for severe pain. 12/27/13  Yes Shanna Cisco, MD  promethazine (PHENERGAN) 25 MG tablet Take 25 mg by mouth every 6 (six) hours as needed. For nausea   Yes Historical Provider, MD  omeprazole (PRILOSEC) 40 MG capsule Take 40 mg by mouth daily as needed. When stomach acts up    Historical Provider, MD    Physical Exam: Blood pressure 150/103, pulse 105, temperature 98.4 F (36.9 C), temperature source Oral, resp. rate 20, weight 83.915 kg (185 lb), SpO2 98.00%. General: Awake, Oriented x3, in some distress from abdominal pain. HEENT: EOMI, Moist mucous membranes Neck: Supple CV: S1 and S2 Lungs: Clear to ascultation bilaterally Abdomen: Soft, tender over the epigastric region with some guarding, Nondistended, +bowel sounds. Ext: Good pulses. Trace edema. No clubbing or cyanosis noted. Neuro: Cranial Nerves II-XII grossly intact. Has 5/5 motor strength in upper and lower extremities.  Lab results:  Recent Labs  12/27/13 1655 12/29/13 0210  NA 131* 130*  K 4.8 4.9  CL 94* 93*  CO2 23 18*  GLUCOSE 114* 131*  BUN 11 9  CREATININE 0.74 0.66  CALCIUM 8.7 9.5    Recent Labs  12/27/13 1655 12/29/13 0210  AST <5 38*  ALT <5 23  ALKPHOS 37* 42  BILITOT 0.3 0.8  PROT 7.9 8.9*  ALBUMIN 4.0 3.9    Recent Labs  12/27/13 1655 12/29/13 0210  LIPASE 96* 519*    Recent Labs  12/27/13 1655 12/29/13 0210  WBC 9.0 17.2*  NEUTROABS 5.7 13.5*  HGB 12.1* 15.6  HCT 34.7* 43.4  MCV 80.9 78.5  PLT 270 301   No results found for this basename: CKTOTAL, CKMB, CKMBINDEX, TROPONINI,  in the last  72 hours No components found with this basename: POCBNP,  No results found for this basename: DDIMER,  in the last 72 hours No results found for this basename: HGBA1C,  in the last 72 hours No results found for this basename: CHOL, HDL, LDLCALC, TRIG, CHOLHDL, LDLDIRECT,  in the last 72 hours No results found for this basename: TSH, T4TOTAL, FREET3, T3FREE, THYROIDAB,  in the last 72 hours No results found for this basename: VITAMINB12, FOLATE, FERRITIN, TIBC, IRON, RETICCTPCT,  in the last 72 hours Imaging results:  No results found.  Assessment & Plan by Problem: Abdominal pain, nausea, and vomiting due to recurrent pancreatitis Recurrent pancreatitis, etiology unclear.  Patient is not on any obvious medications which may trigger pancreatitis.  Patient drinks alcohol infrequently, however used to be a heavy drinker in the past, I have counseled the patient on alcohol cessation completely.  Check lipid panel in the morning.  Request abdominal ultrasound for further evaluation.  Start IV hydration with normal saline.  Pain control with when necessary Dilaudid.  May consider GI consultation based on the abdominal ultrasound findings or if patient has persistent pain.  Will have the patient n.p.o. until pain improves.  Hematemesis? May be due to nausea and vomiting with possibly Mallory-Weiss tear.  Continue to monitor CBC.  No overt bleeding.  Leukocytosis Likely due to pancreatitis.  Continue to monitor.  Urine analysis pending.  Tobacco use Counseled on cessation.  Nicotine patch prescribed.  Prophylaxis SCDs, given concern for possible hematemesis.  CODE STATUS Full code.  Disposition Admit the patient to medical bed as inpatient.  Time spent on admission, talking to the patient, and coordinating care was: 50 mins.  Kiyona Mcnall A, MD 12/29/2013, 3:43 AM

## 2013-12-30 DIAGNOSIS — F17200 Nicotine dependence, unspecified, uncomplicated: Secondary | ICD-10-CM

## 2013-12-30 LAB — HEMOGLOBIN A1C
Hgb A1c MFr Bld: 6 % — ABNORMAL HIGH (ref <5.7)
MEAN PLASMA GLUCOSE: 126 mg/dL — AB (ref <117)

## 2013-12-30 NOTE — Clinical Documentation Improvement (Signed)
  To Hospitalist MD's, NP's, and PA's   Possible Clinical Conditions?     Noted patient's Sodium level 131/ 130/ 132 over 3 days since admission.  Patient had NSL, with IVF of NS @ 125 ml/h started today (12/30/13) .  Please document clinical condition if appropriate.  Noted Na level in Dec 2014 (130).  Thank you   Hyponatremia  Hyposomolality  SIADH                                 Other Condition                  Cannot Clinically Determine    Treatment: NS @ 125 ml/hr  Thank You, Lavonda JumboLawanda J Zula Hovsepian ,RN Clinical Documentation Specialist:  [phone]  [facility] Health Information Management

## 2013-12-30 NOTE — Progress Notes (Signed)
[facility] PROGRESS NOTE  Jordan [name] [id number] DOB: 28-Jun-1966 DOA: 12/29/2013 PCP: Pcp Not In System  Assessment/Plan: 48 y.o. African American male with history of recurrent pancreatitis, and GERD who presents with abdominal pain, is admitted with pancreatitis   1. Acute on chronic pancreatitis; US: Layering echogenic gallbladder sludge; Diffusely hypoechoic pancreatic parenchyma consistent with edema in the setting of acute pancreatitis. No focal pancreatic parenchymal abnormality. -elevated triglycerides >1000; started fenofibrate   -c/s GI for ERCP vs EUS evaluation; US showed gallbladder sludge, may need cholecystectomy eval with recurrent pancreatitis    2. Hepatic steatosis; US: Severe diffuse hepatic steatosis and/or hepatocellular disease without focal hepatic parenchymal abnormality -AST, ALT, bili WNL; f/u GI evaluation   3. Hypertriglyceridemia; check HA1C;  Started fenofibrate, recheck in 4 weeks, add statin; d/w patient lifestyle modification   4. Leukocytosis Likely due to pancreatitis. Afebrile, Continue to monitor.   5. Tobacco use Counseled on cessation. Nicotine patch prescribed.   Prophylaxis SCDs, given concern for possible hematemesis.  Code Status: full Family Communication: d/w patient  (indicate person spoken with, relationship, and if by phone, the number) Disposition Plan: home in 24-48 hours    Consultants:  GI  Procedures:  None   Antibiotics:  None  (indicate start date, and stop date if known)  HPI/Subjective: Alert   Objective: Filed Vitals:   12/30/13 0513  BP: 124/80  Pulse: 95  Temp: 98.6 F (37 C)  Resp: 18    Intake/Output Summary (Last 24 hours) at 12/30/13 0934 Last data filed at 12/30/13 0500  Gross per 24 hour  Intake   1000 ml  Output      0 ml  Net   1000 ml   Filed Weights   12/29/13 0157 12/29/13 0455 12/30/13 0500  Weight: 83.915 kg (185 lb) 85.6 kg (188 lb 11.4 oz) 85.6 kg (188 lb 11.4 oz)     Exam:   General:  alert  Cardiovascular: s1, s2, rrr  Respiratory: CTA BL  Abdomen: soft, nt, nd +BS  Musculoskeletal: no le edema    Data Reviewed: Basic Metabolic Panel:  Recent Labs Lab 12/27/13 1655 12/29/13 0210 12/29/13 0920  NA 131* 130* 132*  K 4.8 4.9 4.2  CL 94* 93* 95*  CO2 23 18* 20  GLUCOSE 114* 131* 108*  BUN 11 9 10   CREATININE 0.74 0.66 0.79  CALCIUM 8.7 9.5 9.2   Liver Function Tests:  Recent Labs Lab 12/27/13 1655 12/29/13 0210 12/29/13 0920  AST <5 38* 22  ALT 5 23 18   ALKPHOS 37* 42 49  BILITOT 0.3 0.8 0.9  PROT 7.9 8.9* 8.1  ALBUMIN 4.0 3.9 3.4*    Recent Labs Lab 12/27/13 1655 12/29/13 0210 12/29/13 0920  LIPASE 96* 519* 472*   No results found for this basename: AMMONIA,  in the last 168 hours CBC:  Recent Labs Lab 12/27/13 1655 12/29/13 0210 12/29/13 0920  WBC 9.0 17.2* 16.2*  NEUTROABS 5.7 13.5*  --   HGB 12.1* 15.6 14.6  HCT 34.7* 43.4 40.7  MCV 80.9 78.5 78.6  PLT 270 301 260   Cardiac Enzymes: No results found for this basename: CKTOTAL, CKMB, CKMBINDEX, TROPONINI,  in the last 168 hours BNP (last 3 results) No results found for this basename: PROBNP,  in the last 8760 hours CBG: No results found for this basename: GLUCAP,  in the last 168 hours  No results found for this or any previous visit (from the past 240 hour(s)).   Studies: [place]  Abdomen Complete  12/29/2013   CLINICAL DATA:  Acute pancreatitis clinically.  EXAM: ULTRASOUND ABDOMEN COMPLETE  COMPARISON:  None.  FINDINGS: Gallbladder:  Layering echogenic sludge. No shadowing gallstones. No gallbladder wall thickening or pericholecystic fluid. Negative sonographic Murphy sign according to the ultrasound technologist.  Common bile duct:  Diameter: Approximately 5 mm centrally. Distal duct obscured by duodenum bowel gas.  Liver:  Diffusely increased and coarsened echotexture without focal hepatic parenchymal abnormality. Patent portal vein with  hepatopetal flow.  IVC:  Patent.  Pancreas:  Diffusely hypoechoic consistent with edema in the setting of acute pancreatitis. No focal pancreatic parenchymal abnormality.  Spleen:  Normal size and echotexture without focal parenchymal abnormality.  Right Kidney:  Length: Approximately 12.1 cm. No hydronephrosis. Well-preserved cortex. No shadowing calculi. Normal parenchymal echotexture without focal abnormalities.  Left Kidney:  Length: Approximately 12.5 cm. No hydronephrosis. Well-preserved cortex. No shadowing calculi. Normal parenchymal echotexture without focal abnormalities. Dromedary hump noted in the mid kidney.  Abdominal aorta:  Normal in caliber throughout its visualized course in the abdomen without evidence of significant atherosclerosis. Maximum diameter 2.5 cm.  Other findings:  None.  IMPRESSION: 1. Severe diffuse hepatic steatosis and/or hepatocellular disease without focal hepatic parenchymal abnormality. 2. Diffusely hypoechoic pancreatic parenchyma consistent with edema in the setting of acute pancreatitis. No focal pancreatic parenchymal abnormality. 3. Gallbladder sludge. No evidence of cholelithiasis or cholecystitis. 4. Otherwise normal examination.   Electronically Signed   By: Hulan Saas M.D.   On: 12/29/2013 16:47    Scheduled Meds: . fenofibrate  160 mg Oral Daily  . nicotine  21 mg Transdermal Q24H  . pantoprazole  80 mg Oral Daily   Continuous Infusions: . sodium chloride 100 mL/hr at 12/30/13 1610    Principal Problem:   Pancreatitis Active Problems:   Abdominal pain   Nausea and vomiting   Tobacco use   Leukocytosis    Time spent: >35 minutes     Esperanza Sheets  [facility] Pager [phone]. If 7PM-7AM, please contact night-coverage at [email or web address], password [facility] 12/30/2013, 9:34 AM  LOS: 1 day

## 2013-12-31 DIAGNOSIS — E781 Pure hyperglyceridemia: Secondary | ICD-10-CM

## 2013-12-31 MED ORDER — FENOFIBRATE 160 MG PO TABS: 160.0000 mg | ORAL_TABLET | Freq: Every day | ORAL | Status: DC

## 2013-12-31 NOTE — Progress Notes (Signed)
Gastroenterology preliminary note: We have been asked to see this patient because of issues with pancreatitis.  My partner, Dr. Odelia GageWill Outlaw, will come by this afternoon to do a consultation on him.  In the meantime, feel free to call me if there are any questions.  Florencia Reasonsobert V. Kallyn Demarcus, M.D. [phone]

## 2013-12-31 NOTE — Progress Notes (Signed)
Discharge instructions reviewed with pt and instructed on where to pick up prescription.  Pt verbalized understanding and had no questions.  Pt discharged in stable condition.  Kayla Weekes Lindsay   

## 2013-12-31 NOTE — Discharge Summary (Signed)
Physician Discharge Summary  Jordan [name] [id number] DOB: Dec 09, 1965 DOA: 12/29/2013  PCP: Pcp Not In System  Admit date: 12/29/2013 Discharge date: 12/31/2013  Time spent: 45 minutes  Recommendations for Outpatient Follow-up:  -Will be discharged home today. -Advised to follow up with his PCP in 2 weeks.   Discharge Diagnoses:  Principal Problem:   Pancreatitis Active Problems:   Abdominal pain   Nausea and vomiting   Tobacco use   Leukocytosis   Hypertriglyceridemia   Discharge Condition: Stable and improved  Filed Weights   12/29/13 0455 12/30/13 0500 12/31/13 0536  Weight: 85.6 kg (188 lb 11.4 oz) 85.6 kg (188 lb 11.4 oz) 87.726 kg (193 lb 6.4 oz)    History of present illness:  Jordan [name] is a 48 y.o. African American male with history of recurrent pancreatitis, and GERD who presents with the above complaints. Patient in the past has had multiple episodes of pancreatitis, his most recent episode was in December of 2013, previously has had [place] in [place]. His recent episode of pancreatitis started on 12/27/2013, he initially presents to the emergency department after pain medications he felt better and decided to go home with conservative management. At home he has had continued pain and could not get any relief. He presented back to the emergency department and was found to have lipase of 519. Hospitalist service was asked to admit the patient for pancreatitis. He denies any recent fevers, chills, chest pain, shortness of breath, headaches or vision changes. He has been nauseated and vomiting at home. Reports vomiting small amount of blood. Has had diarrhea at home yesterday. Hospitalist admission was requested.   Hospital Course:   Acute Pancreatitis -Clinically resolved. -Tolerating a solid diet. -Possible etiology is hypertriglyceridemia.  Hypertriglyceridemia -Has been started on a fibrate. -Was >1000.   Procedures:  None    Consultations:  None  Discharge Instructions  Discharge Orders   Future Orders Complete By Expires   Discontinue IV  As directed    Increase activity slowly  As directed        Medication List    STOP taking these medications       oxyCODONE-acetaminophen 5-325 MG per tablet  Commonly known as:  PERCOCET/ROXICET     promethazine 25 MG tablet  Commonly known as:  PHENERGAN      TAKE these medications       fenofibrate 160 MG tablet  Take 1 tablet (160 mg total) by mouth daily.     omeprazole 40 MG capsule  Commonly known as:  PRILOSEC  Take 40 mg by mouth daily as needed. When stomach acts up     ondansetron 4 MG tablet  Commonly known as:  ZOFRAN  Take 1 tablet (4 mg total) by mouth every 6 (six) hours.       No Known Allergies     Follow-up Information   Schedule an appointment as soon as possible for a visit in 2 weeks to follow up. (With your regular physician)        The results of significant diagnostics from this hospitalization (including imaging, microbiology, ancillary and laboratory) are listed below for reference.    Significant Diagnostic Studies: [place] Abdomen Complete  12/29/2013   CLINICAL DATA:  Acute pancreatitis clinically.  EXAM: ULTRASOUND ABDOMEN COMPLETE  COMPARISON:  None.  FINDINGS: Gallbladder:  Layering echogenic sludge. No shadowing gallstones. No gallbladder wall thickening or pericholecystic fluid. Negative sonographic Murphy sign according to the ultrasound technologist.  Common bile duct:  Diameter: Approximately 5 mm centrally. Distal duct obscured by duodenum bowel gas.  Liver:  Diffusely increased and coarsened echotexture without focal hepatic parenchymal abnormality. Patent portal vein with hepatopetal flow.  IVC:  Patent.  Pancreas:  Diffusely hypoechoic consistent with edema in the setting of acute pancreatitis. No focal pancreatic parenchymal abnormality.  Spleen:  Normal size and echotexture without focal parenchymal  abnormality.  Right Kidney:  Length: Approximately 12.1 cm. No hydronephrosis. Well-preserved cortex. No shadowing calculi. Normal parenchymal echotexture without focal abnormalities.  Left Kidney:  Length: Approximately 12.5 cm. No hydronephrosis. Well-preserved cortex. No shadowing calculi. Normal parenchymal echotexture without focal abnormalities. Dromedary hump noted in the mid kidney.  Abdominal aorta:  Normal in caliber throughout its visualized course in the abdomen without evidence of significant atherosclerosis. Maximum diameter 2.5 cm.  Other findings:  None.  IMPRESSION: 1. Severe diffuse hepatic steatosis and/or hepatocellular disease without focal hepatic parenchymal abnormality. 2. Diffusely hypoechoic pancreatic parenchyma consistent with edema in the setting of acute pancreatitis. No focal pancreatic parenchymal abnormality. 3. Gallbladder sludge. No evidence of cholelithiasis or cholecystitis. 4. Otherwise normal examination.   Electronically Signed   By: Hulan Saas M.D.   On: 12/29/2013 16:47    Microbiology: No results found for this or any previous visit (from the past 240 hour(s)).   Labs: Basic Metabolic Panel:  Recent Labs Lab 12/27/13 1655 12/29/13 0210 12/29/13 0920  NA 131* 130* 132*  K 4.8 4.9 4.2  CL 94* 93* 95*  CO2 23 18* 20  GLUCOSE 114* 131* 108*  BUN 11 9 10   CREATININE 0.74 0.66 0.79  CALCIUM 8.7 9.5 9.2   Liver Function Tests:  Recent Labs Lab 12/27/13 1655 12/29/13 0210 12/29/13 0920  AST <5 38* 22  ALT 5 23 18   ALKPHOS 37* 42 49  BILITOT 0.3 0.8 0.9  PROT 7.9 8.9* 8.1  ALBUMIN 4.0 3.9 3.4*    Recent Labs Lab 12/27/13 1655 12/29/13 0210 12/29/13 0920  LIPASE 96* 519* 472*   No results found for this basename: AMMONIA,  in the last 168 hours CBC:  Recent Labs Lab 12/27/13 1655 12/29/13 0210 12/29/13 0920  WBC 9.0 17.2* 16.2*  NEUTROABS 5.7 13.5*  --   HGB 12.1* 15.6 14.6  HCT 34.7* 43.4 40.7  MCV 80.9 78.5 78.6  PLT  270 301 260   Cardiac Enzymes: No results found for this basename: CKTOTAL, CKMB, CKMBINDEX, TROPONINI,  in the last 168 hours BNP: BNP (last 3 results) No results found for this basename: PROBNP,  in the last 8760 hours CBG: No results found for this basename: GLUCAP,  in the last 168 hours     Signed:  Chaya Jan  [facility] Pager: [phone] 12/31/2013, 2:15 PM

## 2014-03-21 ENCOUNTER — Emergency Department (HOSPITAL_COMMUNITY)
Admission: EM | Admit: 2014-03-21 | Discharge: 2014-03-21 | Disposition: A | Discharge status: Home / Self Care | Payer: 59 | Attending: Emergency Medicine | Admitting: Emergency Medicine

## 2014-03-21 ENCOUNTER — Emergency Department (HOSPITAL_COMMUNITY): Payer: 59

## 2014-03-21 ENCOUNTER — Encounter (HOSPITAL_COMMUNITY): Payer: Self-pay | Admitting: Emergency Medicine

## 2014-03-21 DIAGNOSIS — K5792 Diverticulitis of intestine, part unspecified, without perforation or abscess without bleeding: Secondary | ICD-10-CM

## 2014-03-21 DIAGNOSIS — K5732 Diverticulitis of large intestine without perforation or abscess without bleeding: Secondary | ICD-10-CM | POA: Insufficient documentation

## 2014-03-21 DIAGNOSIS — F17200 Nicotine dependence, unspecified, uncomplicated: Secondary | ICD-10-CM | POA: Insufficient documentation

## 2014-03-21 DIAGNOSIS — Z79899 Other long term (current) drug therapy: Secondary | ICD-10-CM | POA: Insufficient documentation

## 2014-03-21 LAB — URINE MICROSCOPIC-ADD ON

## 2014-03-21 LAB — CBC WITH DIFFERENTIAL/PLATELET
BASOS PCT: 0 % (ref 0–1)
Basophils Absolute: 0 10*3/uL (ref 0.0–0.1)
EOS PCT: 1 % (ref 0–5)
Eosinophils Absolute: 0.1 10*3/uL (ref 0.0–0.7)
HEMATOCRIT: 39.3 % (ref 39.0–52.0)
HEMOGLOBIN: 13.6 g/dL (ref 13.0–17.0)
Lymphocytes Relative: 29 % (ref 12–46)
Lymphs Abs: 3.1 10*3/uL (ref 0.7–4.0)
MCH: 27.2 pg (ref 26.0–34.0)
MCHC: 34.6 g/dL (ref 30.0–36.0)
MCV: 78.6 fL (ref 78.0–100.0)
MONO ABS: 0.7 10*3/uL (ref 0.1–1.0)
Monocytes Relative: 7 % (ref 3–12)
NEUTROS ABS: 6.7 10*3/uL (ref 1.7–7.7)
Neutrophils Relative %: 63 % (ref 43–77)
Platelets: 274 10*3/uL (ref 150–400)
RBC: 5 MIL/uL (ref 4.22–5.81)
RDW: 14.5 % (ref 11.5–15.5)
WBC: 10.6 10*3/uL — ABNORMAL HIGH (ref 4.0–10.5)

## 2014-03-21 LAB — COMPREHENSIVE METABOLIC PANEL
ALBUMIN: 4.1 g/dL (ref 3.5–5.2)
ALT: 34 U/L (ref 0–53)
AST: 28 U/L (ref 0–37)
Alkaline Phosphatase: 40 U/L (ref 39–117)
BUN: 10 mg/dL (ref 6–23)
CO2: 23 mEq/L (ref 19–32)
CREATININE: 0.84 mg/dL (ref 0.50–1.35)
Calcium: 9.5 mg/dL (ref 8.4–10.5)
Chloride: 97 mEq/L (ref 96–112)
GFR calc Af Amer: >90 mL/min (ref >90)
Glucose, Bld: 83 mg/dL (ref 70–99)
Potassium: 4.1 mEq/L (ref 3.7–5.3)
Sodium: 137 mEq/L (ref 137–147)
Total Bilirubin: 0.6 mg/dL (ref 0.3–1.2)
Total Protein: 8.3 g/dL (ref 6.0–8.3)

## 2014-03-21 LAB — URINALYSIS, ROUTINE W REFLEX MICROSCOPIC
Bilirubin Urine: NEGATIVE
GLUCOSE, UA: NEGATIVE mg/dL
Ketones, ur: NEGATIVE mg/dL
Leukocytes, UA: NEGATIVE
Nitrite: NEGATIVE
Protein, ur: NEGATIVE mg/dL
SPECIFIC GRAVITY, URINE: 1.019 (ref 1.005–1.030)
Urobilinogen, UA: 1 mg/dL (ref 0.0–1.0)
pH: 6 (ref 5.0–8.0)

## 2014-03-21 LAB — LIPASE, BLOOD: LIPASE: 17 U/L (ref 11–59)

## 2014-03-21 MED ORDER — ONDANSETRON HCL 4 MG/2ML IJ SOLN
4.0000 mg | Freq: Once | INTRAMUSCULAR | Status: AC
  Administered 2014-03-21: 4 mg via INTRAVENOUS
  Filled 2014-03-21: qty 2

## 2014-03-21 MED ORDER — HYDROCODONE-ACETAMINOPHEN 5-325 MG PO TABS: 1.0000 | ORAL_TABLET | Freq: Four times a day (QID) | ORAL | Status: DC | PRN

## 2014-03-21 MED ORDER — METRONIDAZOLE 500 MG PO TABS: 500.0000 mg | ORAL_TABLET | Freq: Three times a day (TID) | ORAL | Status: DC

## 2014-03-21 MED ORDER — HYDROMORPHONE HCL PF 1 MG/ML IJ SOLN
0.5000 mg | Freq: Once | INTRAMUSCULAR | Status: AC
  Administered 2014-03-21: 0.5 mg via INTRAVENOUS
  Filled 2014-03-21: qty 1

## 2014-03-21 MED ORDER — IOHEXOL 300 MG/ML  SOLN
100.0000 mL | Freq: Once | INTRAMUSCULAR | Status: AC | PRN
  Administered 2014-03-21: 100 mL via INTRAVENOUS

## 2014-03-21 MED ORDER — CIPROFLOXACIN HCL 500 MG PO TABS: 500.0000 mg | ORAL_TABLET | Freq: Two times a day (BID) | ORAL | Status: DC

## 2014-03-21 MED ORDER — IOHEXOL 300 MG/ML  SOLN
25.0000 mL | INTRAMUSCULAR | Status: AC
  Administered 2014-03-21: 25 mL via ORAL

## 2014-03-21 MED ORDER — SODIUM CHLORIDE 0.9 % IV BOLUS (SEPSIS)
500.0000 mL | Freq: Once | INTRAVENOUS | Status: AC
  Administered 2014-03-21: 500 mL via INTRAVENOUS

## 2014-03-21 NOTE — ED Provider Notes (Signed)
History/physical exam/procedure(s) were performed by non-physician practitioner and as supervising physician I was immediately available for consultation/collaboration. I have reviewed all notes and am in agreement with care and plan.   Arianna Delsanto S Katelind Pytel, MD 03/21/14 1838 

## 2014-03-21 NOTE — Discharge Instructions (Signed)
Your CT scan showed diverticulitis. See information below. Take pain medication as prescribed as needed. Take both antibiotics as prescribed until all gone. Followup with your primary care doctor. Your CT also showed a lesion on your pancreas, recommended MRI of the abdomen with and without contrast in 4-6 months, please discuss this with your primary care doctor. Return to emergency department if your symptoms are worsening   Diverticulitis A diverticulum is a small pouch or sac on the colon. Diverticulosis is the presence of these diverticula on the colon. Diverticulitis is the irritation (inflammation) or infection of diverticula. CAUSES  The colon and its diverticula contain bacteria. If food particles block the tiny opening to a diverticulum, the bacteria inside can grow and cause an increase in pressure. This leads to infection and inflammation and is called diverticulitis. SYMPTOMS   Abdominal pain and tenderness. Usually, the pain is located on the left side of your abdomen. However, it could be located elsewhere.  Fever.  Bloating.  Feeling sick to your stomach (nausea).  Throwing up (vomiting).  Abnormal stools. DIAGNOSIS  Your caregiver will take a history and perform a physical exam. Since many things can cause abdominal pain, other tests may be necessary. Tests may include:  Blood tests.  Urine tests.  X-ray of the abdomen.  CT scan of the abdomen. Sometimes, surgery is needed to determine if diverticulitis or other conditions are causing your symptoms. TREATMENT  Most of the time, you can be treated without surgery. Treatment includes:  Resting the bowels by only having liquids for a few days. As you improve, you will need to eat a low-fiber diet.  Intravenous (IV) fluids if you are losing body fluids (dehydrated).  Antibiotic medicines that treat infections may be given.  Pain and nausea medicine, if needed.  Surgery if the inflamed diverticulum has  burst. HOME CARE INSTRUCTIONS   Try a clear liquid diet (broth, tea, or water for as long as directed by your caregiver). You may then gradually begin a low-fiber diet as tolerated.  A low-fiber diet is a diet with less than 10 grams of fiber. Choose the foods below to reduce fiber in the diet:  White breads, cereals, rice, and pasta.  Cooked fruits and vegetables or soft fresh fruits and vegetables without the skin.  Ground or well-cooked tender beef, ham, veal, lamb, pork, or poultry.  Eggs and seafood.  After your diverticulitis symptoms have improved, your caregiver may put you on a high-fiber diet. A high-fiber diet includes 14 grams of fiber for every 1000 calories consumed. For a standard 2000 calorie diet, you would need 28 grams of fiber. Follow these diet guidelines to help you increase the fiber in your diet. It is important to slowly increase the amount fiber in your diet to avoid gas, constipation, and bloating.  Choose whole-grain breads, cereals, pasta, and brown rice.  Choose fresh fruits and vegetables with the skin on. Do not overcook vegetables because the more vegetables are cooked, the more fiber is lost.  Choose more nuts, seeds, legumes, dried peas, beans, and lentils.  Look for food products that have greater than 3 grams of fiber per serving on the Nutrition Facts label.  Take all medicine as directed by your caregiver.  If your caregiver has given you a follow-up appointment, it is very important that you go. Not going could result in lasting (chronic) or permanent injury, pain, and disability. If there is any problem keeping the appointment, call to reschedule. SEEK MEDICAL CARE  IF:   Your pain does not improve.  You have a hard time advancing your diet beyond clear liquids.  Your bowel movements do not return to normal. SEEK IMMEDIATE MEDICAL CARE IF:   Your pain becomes worse.  You have an oral temperature above 102 F (38.9 C), not controlled by  medicine.  You have repeated vomiting.  You have bloody or black, tarry stools.  Symptoms that brought you to your caregiver become worse or are not getting better. MAKE SURE YOU:   Understand these instructions.  Will watch your condition.  Will get help right away if you are not doing well or get worse. Document Released: 08/16/2005 Document Revised: 01/29/2012 Document Reviewed: 12/12/2010 [facility] Patient Information 2014 [place], [place].

## 2014-03-21 NOTE — ED Notes (Signed)
CT notified patient has drank first cup of contrast.

## 2014-03-21 NOTE — ED Notes (Signed)
Pt c/o  Low mid to left sided abdominal pain onset Thursday evening. Pt denies n/v.

## 2014-03-21 NOTE — ED Provider Notes (Signed)
CSN: [id number]     Arrival date & time 03/21/14  1140 History   First MD Initiated Contact with Patient 03/21/14 1155     Chief Complaint  Patient presents with  . Abdominal Pain     (Consider location/radiation/quality/duration/timing/severity/associated sxs/prior Treatment) HPI Jordan [name] is a 48 y.o. male who presents to emergency department complaining of abdominal pain, nausea. Patient states that his pain began 2 days ago. States it's mainly in the left upper quadrant, left lower quadrant, and right lower quadrant. States pain is constant but comes and goes in intensity. States that he has had some nausea but denies any vomiting. Last bowel movement was yesterday was normal. Denies any blood in his stool. Denies any diarrhea. States he has no appetite. Denies any fever chills. Patient does have history of pancreatitis and gallbladder sludge, however he states this pain that he is having right now is different. He denies trying any medications prior to coming in.   Past Medical History  Diagnosis Date  . Pancreatitis    History reviewed. No pertinent past surgical history. Family History  Problem Relation Age of Onset  . Other Mother     Healthy  . Other Father     Healthy   History  Substance Use Topics  . Smoking status: Current Every Day Smoker -- 1.00 packs/day    Types: Cigarettes  . Smokeless tobacco: Not on file  . Alcohol Use: Yes     Comment: once a month.    Review of Systems  Constitutional: Positive for appetite change. Negative for fever and chills.  Respiratory: Negative for cough, chest tightness and shortness of breath.   Cardiovascular: Negative for chest pain, palpitations and leg swelling.  Gastrointestinal: Positive for nausea and abdominal pain. Negative for vomiting, diarrhea and abdominal distention.  Genitourinary: Negative for dysuria, urgency, frequency and hematuria.  Musculoskeletal: Negative for arthralgias, myalgias, neck pain and neck  stiffness.  Skin: Negative for rash.  Allergic/Immunologic: Negative for immunocompromised state.  Neurological: Negative for dizziness, weakness, light-headedness, numbness and headaches.      Allergies  Review of patient's allergies indicates no known allergies.  Home Medications   Prior to Admission medications   Medication Sig Start Date End Date Taking? Authorizing Provider  fenofibrate 160 MG tablet Take 1 tablet (160 mg total) by mouth daily. 12/31/13   Henderson CloudEstela Y Hernandez Acosta, MD  omeprazole (PRILOSEC) 40 MG capsule Take 40 mg by mouth daily as needed. When stomach acts up    Historical Provider, MD  ondansetron (ZOFRAN) 4 MG tablet Take 1 tablet (4 mg total) by mouth every 6 (six) hours. 12/27/13   Shanna CiscoMegan E Docherty, MD   BP 121/82  Pulse 79  Temp(Src) 98.2 F (36.8 C) (Oral)  Resp 18  Ht 5\' 5"  (1.651 m)  Wt 190 lb (86.183 kg)  BMI 31.62 kg/m2  SpO2 98% Physical Exam  Nursing note and vitals reviewed. Constitutional: He is oriented to person, place, and time. He appears well-developed and well-nourished. No distress.  HENT:  Head: Normocephalic and atraumatic.  Eyes: Conjunctivae are normal.  Neck: Neck supple.  Cardiovascular: Normal rate, regular rhythm and normal heart sounds.   Pulmonary/Chest: Effort normal. No respiratory distress. He has no wheezes. He has no rales.  Abdominal: Soft. Bowel sounds are normal. He exhibits no distension. There is tenderness. There is no rebound.  Left upper quadrant, left lower quadrant, right lower quadrant tenderness. Some guarding present.  Musculoskeletal: He exhibits no edema.  Neurological: He  is alert and oriented to person, place, and time.  Skin: Skin is warm and dry.    ED Course  Procedures (including critical care time) Labs Review Labs Reviewed  CBC WITH DIFFERENTIAL - Abnormal; Notable for the following:    WBC 10.6 (*)    All other components within normal limits  URINALYSIS, ROUTINE W REFLEX MICROSCOPIC -  Abnormal; Notable for the following:    Hgb urine dipstick SMALL (*)    All other components within normal limits  URINE MICROSCOPIC-ADD ON - Abnormal; Notable for the following:    Squamous Epithelial / LPF FEW (*)    All other components within normal limits  URINE CULTURE  COMPREHENSIVE METABOLIC PANEL  LIPASE, BLOOD    Imaging Review Ct Abdomen Pelvis W Contrast  03/21/2014   CLINICAL DATA:  Left-sided abdominal pain.  EXAM: CT ABDOMEN AND PELVIS WITH CONTRAST  TECHNIQUE: Multidetector CT imaging of the abdomen and pelvis was performed using the standard protocol following bolus administration of intravenous contrast.  CONTRAST:  100mL OMNIPAQUE IOHEXOL 300 MG/ML  SOLN  COMPARISON:  Ultrasound 12/29/2013  FINDINGS: The lung bases are clear except for dependent atelectasis. The heart is normal in size. No pericardial effusion.  There is severe and diffuse fatty infiltration of the liver but no focal hepatic lesions or intrahepatic biliary dilatation. Gallbladder is normal. No common bowel duct dilatation. The pancreas is normal except for a small, 10.5 mm lesion in the pancreatic tail. This is an indeterminate finding but measures 8 Hounsfield units and could be a simple cyst. The spleen is normal in size. No focal lesions. The adrenal glands and kidneys are unremarkable.  The stomach, duodenum and small bowel are unremarkable. No inflammatory changes, mass lesions or obstructive findings.  There is acute uncomplicated Severe diverticulitis involving the descending colon sigmoid colon junction region. Marked inflammation, wall thickening and pericolonic inflammation but no abscess or free air. The remainder the colon is unremarkable. The appendix is normal. No mesenteric or retroperitoneal mass or adenopathy. Small scattered lymph nodes are noted. The aorta and branch vessels are normal. The major venous structures are patent.  The bladder, prostate gland and seminal vesicles are unremarkable. No  pelvic mass or free pelvic fluid collection. No inguinal mass or hernia.  The bony structures are unremarkable.  IMPRESSION: Acute uncomplicated diverticulitis involving the distal descending colon sigmoid colon junction region.  Diffuse and severe fatty infiltration of the liver.  10 mm low-attenuation lesion in the pancreatic tail is likely benign but does require followup. I would recommend an MRI of the abdomen without and with contrast in 4-6 months.   Electronically Signed   By: Loralie ChampagneMark  Gallerani M.D.   On: 03/21/2014 14:59     EKG Interpretation None      MDM   Final diagnoses:  Diverticulitis    PT is here with abdominal pain, abdominal tenderness, some guarding, will need blood work, urinalysis. CT abdomen and pelvis to rule out any surgical pathology. Pain and nausea medications ordered.   3:06 PM CT showing mild diverticulitis which would explain pt's symptoms. His CT also showed pancreatic lesion, "most likely benign" but recommended followup with MRI with and without contrast and 4-6 months. I discussed this with patient and he is a [employer]. Patient has no vomiting, no nausea, vital signs are normal, stable for outpatient treatment.  Filed Vitals:   03/21/14 1351 03/21/14 1400 03/21/14 1447 03/21/14 1500  BP: 115/75 114/74 128/77 119/76  Pulse: 71 78 79 77  Temp:      TempSrc:      Resp: 18     Height:      Weight:      SpO2: 97% 96% 97% 97%     Lottie Mussel, PA-C 03/21/14 1512

## 2014-03-23 LAB — URINE CULTURE
Colony Count: NO GROWTH
Culture: NO GROWTH

## 2014-04-17 ENCOUNTER — Encounter (HOSPITAL_COMMUNITY): Payer: Self-pay | Admitting: *Deleted

## 2014-04-17 ENCOUNTER — Encounter (HOSPITAL_COMMUNITY): Payer: Self-pay | Admitting: Pharmacy Technician

## 2014-04-20 ENCOUNTER — Other Ambulatory Visit: Payer: Self-pay | Admitting: Gastroenterology

## 2014-04-20 NOTE — Addendum Note (Signed)
Addended by: Rivka Baune on: 04/20/2014 02:29 PM   Modules accepted: Orders  

## 2014-05-06 ENCOUNTER — Ambulatory Visit (HOSPITAL_COMMUNITY): Admission: RE | Admit: 2014-05-06 | Payer: 59 | Source: Ambulatory Visit | Admitting: Gastroenterology

## 2014-05-06 HISTORY — DX: Unspecified osteoarthritis, unspecified site: M19.90

## 2014-05-06 HISTORY — DX: Gastro-esophageal reflux disease without esophagitis: K21.9

## 2014-05-06 SURGERY — ESOPHAGEAL ENDOSCOPIC ULTRASOUND (EUS) RADIAL
Anesthesia: Monitor Anesthesia Care

## 2014-08-05 ENCOUNTER — Encounter (HOSPITAL_COMMUNITY): Payer: Self-pay | Admitting: *Deleted

## 2014-08-06 ENCOUNTER — Encounter (HOSPITAL_COMMUNITY): Payer: Self-pay | Admitting: Pharmacy Technician

## 2014-08-10 ENCOUNTER — Ambulatory Visit: Payer: 59 | Admitting: *Deleted

## 2014-08-11 ENCOUNTER — Other Ambulatory Visit: Payer: Self-pay | Admitting: Gastroenterology

## 2014-08-11 NOTE — Addendum Note (Signed)
Addended by: Tanaya Dunigan on: 08/11/2014 05:12 PM   Modules accepted: Orders  

## 2014-08-12 ENCOUNTER — Encounter (HOSPITAL_COMMUNITY): Payer: Self-pay | Admitting: Anesthesiology

## 2014-08-12 ENCOUNTER — Encounter (HOSPITAL_COMMUNITY): Payer: 59 | Admitting: Anesthesiology

## 2014-08-12 ENCOUNTER — Ambulatory Visit (HOSPITAL_COMMUNITY): Payer: 59 | Admitting: Anesthesiology

## 2014-08-12 ENCOUNTER — Encounter (HOSPITAL_COMMUNITY)
Admission: RE | Disposition: A | Discharge status: Home / Self Care | Payer: Self-pay | Source: Ambulatory Visit | Attending: Gastroenterology

## 2014-08-12 ENCOUNTER — Ambulatory Visit (HOSPITAL_COMMUNITY)
Admission: RE | Admit: 2014-08-12 | Discharge: 2014-08-12 | Disposition: A | Discharge status: Home / Self Care | Payer: 59 | Source: Ambulatory Visit | Attending: Gastroenterology | Admitting: Gastroenterology

## 2014-08-12 DIAGNOSIS — K859 Acute pancreatitis without necrosis or infection, unspecified: Secondary | ICD-10-CM | POA: Insufficient documentation

## 2014-08-12 DIAGNOSIS — F17200 Nicotine dependence, unspecified, uncomplicated: Secondary | ICD-10-CM | POA: Diagnosis not present

## 2014-08-12 HISTORY — PX: EUS: SHX5427

## 2014-08-12 HISTORY — PX: FINE NEEDLE ASPIRATION: SHX5430

## 2014-08-12 SURGERY — ESOPHAGEAL ENDOSCOPIC ULTRASOUND (EUS) RADIAL
Anesthesia: Monitor Anesthesia Care

## 2014-08-12 MED ORDER — LIDOCAINE HCL (CARDIAC) 20 MG/ML IV SOLN
INTRAVENOUS | Status: AC
  Filled 2014-08-12: qty 5

## 2014-08-12 MED ORDER — PROPOFOL INFUSION 10 MG/ML OPTIME
INTRAVENOUS | Status: DC | PRN
  Administered 2014-08-12: 120 ug/kg/min via INTRAVENOUS

## 2014-08-12 MED ORDER — PROPOFOL 10 MG/ML IV BOLUS
INTRAVENOUS | Status: AC
  Filled 2014-08-12: qty 20

## 2014-08-12 MED ORDER — KETAMINE HCL 10 MG/ML IJ SOLN
INTRAMUSCULAR | Status: AC
  Filled 2014-08-12: qty 1

## 2014-08-12 MED ORDER — PROPOFOL 10 MG/ML IV EMUL
INTRAVENOUS | Status: DC | PRN
  Administered 2014-08-12 (×4): 40 mg via INTRAVENOUS

## 2014-08-12 MED ORDER — SODIUM CHLORIDE 0.9 % IV SOLN: INTRAVENOUS | Status: DC

## 2014-08-12 MED ORDER — KETAMINE HCL 10 MG/ML IJ SOLN
INTRAMUSCULAR | Status: DC | PRN
  Administered 2014-08-12 (×2): 10 mg via INTRAVENOUS

## 2014-08-12 MED ORDER — LACTATED RINGERS IV SOLN
INTRAVENOUS | Status: DC | PRN
  Administered 2014-08-12: 08:00:00 via INTRAVENOUS

## 2014-08-12 NOTE — Discharge Instructions (Signed)
Endoscopic ultrasound  Care After Please read the instructions outlined below and refer to this sheet in the next few weeks. These discharge instructions provide you with general information on caring for yourself after you leave the hospital. Your doctor may also give you specific instructions. While your treatment has been planned according to the most current medical practices available, unavoidable complications occasionally occur. If you have any problems or questions after discharge, please call Dr. Dulce Sellar [facility]) at [phone].  HOME CARE INSTRUCTIONS Activity  You may resume your regular activity but move at a slower pace for the next 24 hours.   Take frequent rest periods for the next 24 hours.   Walking will help expel (get rid of) the air and reduce the bloated feeling in your abdomen.   No driving for 24 hours (because of the anesthesia (medicine) used during the test).   You may shower.   Do not sign any important legal documents or operate any machinery for 24 hours (because of the anesthesia used during the test).  Nutrition  Drink plenty of fluids.   You may resume your normal diet.   Begin with a light meal and progress to your normal diet.   Avoid alcoholic beverages for 24 hours or as instructed by your caregiver.  Medications You may resume your normal medications unless your caregiver tells you otherwise. What you can expect today  You may experience abdominal discomfort such as a feeling of fullness or "gas" pains.   You may experience a sore throat for 2 to 3 days. This is normal. Gargling with salt water may help this.    SEEK IMMEDIATE MEDICAL CARE IF:  You have excessive nausea (feeling sick to your stomach) and/or vomiting.   You have severe abdominal pain and distention (swelling).   You have trouble swallowing.   You have a temperature over 100 F (37.8 C).   You have rectal bleeding or vomiting of blood.  Document  Released: 06/20/2004 Document Revised: 07/19/2011 Document Reviewed: 01/01/2008 [facility] Patient Information 2012 [place], [place].Gastrointestinal Endoscopy, Care After Refer to this sheet in the next few weeks. These instructions provide you with information on caring for yourself after your procedure. Your caregiver may also give you more specific instructions. Your treatment has been planned according to current medical practices, but problems sometimes occur. Call your caregiver if you have any problems or questions after your procedure. HOME CARE INSTRUCTIONS  If you were given medicine to help you relax (sedative), do not drive, operate machinery, or sign important documents for 24 hours.  Avoid alcohol and hot or warm beverages for the first 24 hours after the procedure.  Only take over-the-counter or prescription medicines for pain, discomfort, or fever as directed by your caregiver. You may resume taking your normal medicines unless your caregiver tells you otherwise. Ask your caregiver when you may resume taking medicines that may cause bleeding, such as aspirin, clopidogrel, or warfarin.  You may return to your normal diet and activities on the day after your procedure, or as directed by your caregiver. Walking may help to reduce any bloated feeling in your abdomen.  Drink enough fluids to keep your urine clear or pale yellow.  You may gargle with salt water if you have a sore throat. SEEK IMMEDIATE MEDICAL CARE IF:  You have severe nausea or vomiting.  You have severe abdominal pain, abdominal cramps that last longer than 6 hours, or abdominal swelling (distention).  You have severe shoulder or back pain.  You have trouble swallowing.  You have shortness of breath, your breathing is shallow, or you are breathing faster than normal.  You have a fever or a rapid heartbeat.  You vomit blood or material that looks like coffee grounds.  You have bloody, black, or tarry  stools. MAKE SURE YOU:  Understand these instructions.  Will watch your condition.  Will get help right away if you are not doing well or get worse. Document Released: 06/20/2004 Document Revised: 03/23/2014 Document Reviewed: 02/06/2012 [facility] Patient Information 2015 [place], [place]. This information is not intended to replace advice given to you by your health care provider. Make sure you discuss any questions you have with your health care provider.

## 2014-08-12 NOTE — Op Note (Addendum)
[facility] [address] [place], [address]   UPPER ENDOSCOPIC ULTRASOUND PROCEDURE REPORT     EXAM DATE: 08/12/2014  PATIENT NAME:          Jordan [name], Jordan [name]          MR#:        [id number]  BIRTHDATE:       09-22-1966     VISIT #:     [id number] ATTENDING:     Willis Modena, MD     STATUS:     outpatient ASSISTANT:      Levie Heritage and Shoffner, Davida  INDICATIONS:  The patient is a 48 yr old male here for a lower endoscopic ultrasound due to pancreatitis and pancreatitis.  PROCEDURE PERFORMED:     Upper Endoscopic Ultrasound without FNA   MEDICATIONS:     Per Anesthesia  CONSENT: The patient understands the risks and benefits of the procedure and understands that these risks include, but are not limited to: sedation, allergic reaction, infection, perforation and/or bleeding. Alternative means of evaluation and treatment include, among others: physical exam, x-rays, and/or surgical intervention. The patient elects to proceed with this endoscopic procedure.  DESCRIPTION OF PROCEDURE:     Under direct visualization, the radial forward-viewing echoendoscope was introduced through the mouth and advanced to the second portion of the duodenum.  Water was used as necessary to provide an acoustic interface. The pulse, BP, and O2 saturation were monitored and documented by the physician and nursing staff throughout the procedure. Upon completion of the imaging, water was removed and the patient was then discharged to recovery in stable condition with the appropriate post procedure care.     FINDINGS:  Scattered hyperechoic strands and foci, otherwise normal-appearing pancreas.  Specifically, no evidence of chronic pancreatitis, no pancreatic mass, no peripancreatic adenopathy.  No evidence of pancreatic cyst/lesion in tail, as was seen in May 2015 CT, suspect patient had resolving pseudocyst.  Bile duct non-dilated and no evidence of bile duct wall  thickening, mass, or choledocholithiasis.  Gallbladder was normal-appearing; I could not discern any cholelithiasis or microlithiasis/sludge.  Left lobe of liver was diffusely hyperechoic, consistent with steatosis.  IMPRESSIONS:     As above.  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  No other alternative explanation was seen on today's endoscopic ultrasound.  RECOMMENDATIONS:     1.  Watch for potential complications of procedure. 2.  Follow-up with Dr. Sharl Ma for ongoing management of patient's hypertriglyceridemia. 3.  Follow-up with [facility] on as-needed basis.  ___________________________________ Willis Modena, MD eSigned:  Willis Modena, MD 08/12/2014 9:37 AMed r

## 2014-08-12 NOTE — Transfer of Care (Signed)
Immediate Anesthesia Transfer of Care Note  Patient: Jordan [name]  Procedure(s) Performed: Procedure(s): ESOPHAGEAL ENDOSCOPIC ULTRASOUND (EUS) RADIAL (N/A) FINE NEEDLE ASPIRATION (FNA) LINEAR (N/A)  Patient Location: PACU  Anesthesia Type:MAC  Level of Consciousness: awake, alert  and oriented  Airway & Oxygen Therapy: Patient Spontanous Breathing and Patient connected to nasal cannula oxygen  Post-op Assessment: Report given to PACU RN and Post -op Vital signs reviewed and stable  Post vital signs: Reviewed and stable  Complications: No apparent anesthesia complications

## 2014-08-12 NOTE — Anesthesia Postprocedure Evaluation (Signed)
  Anesthesia Post-op Note  Patient: Jordan [name]  Procedure(s) Performed: Procedure(s) (LRB): ESOPHAGEAL ENDOSCOPIC ULTRASOUND (EUS) RADIAL (N/A) FINE NEEDLE ASPIRATION (FNA) LINEAR (N/A)  Patient Location: PACU  Anesthesia Type: MAC  Level of Consciousness: awake and alert   Airway and Oxygen Therapy: Patient Spontanous Breathing  Post-op Pain: mild  Post-op Assessment: Post-op Vital signs reviewed, Patient's Cardiovascular Status Stable, Respiratory Function Stable, Patent Airway and No signs of Nausea or vomiting  Last Vitals:  Filed Vitals:   08/12/14 0927  BP: 110/65  Pulse: 62  Temp:   Resp: 12    Post-op Vital Signs: stable   Complications: No apparent anesthesia complications

## 2014-08-12 NOTE — H&P (Signed)
Patient interval history reviewed.  Patient examined again.  There has been no change from documented H/P dated 07/23/14 (scanned into chart from our office) except as documented above.  Assessment:  1.  Pancreatitis.  Suspect hypertriglyceridemia-related.  ? Sludge on recent imaging, ? Cyst in pancreatic tail on CT.  Plan:  1.  Endoscopic ultrasound with possible cyst aspiration versus biopsies. 2.  Risks (bleeding, infection, bowel perforation that could require surgery, sedation-related changes in cardiopulmonary systems), benefits (identification and possible treatment of source of symptoms, exclusion of certain causes of symptoms), and alternatives (watchful waiting, radiographic imaging studies, empiric medical treatment) of upper endoscopy with ultrasound and possible biopsies (EUS +/- FNA) were explained to patient/family in detail and patient wishes to proceed.

## 2014-08-12 NOTE — Anesthesia Preprocedure Evaluation (Addendum)
Anesthesia Evaluation  Patient identified by MRN, date of birth, ID band Patient awake    Reviewed: Allergy & Precautions, H&P , NPO status , Patient's Chart, lab work & pertinent test results  Airway Mallampati: II TM Distance: >3 FB Neck ROM: full    Dental  (+) Chipped, Dental Advisory Given Chipped right upper front and front lateral incisor next to it.:   Pulmonary neg pulmonary ROS, Current Smoker,  breath sounds clear to auscultation  Pulmonary exam normal       Cardiovascular Exercise Tolerance: Good negative cardio ROS  Rhythm:regular Rate:Normal     Neuro/Psych negative neurological ROS  negative psych ROS   GI/Hepatic negative GI ROS, Neg liver ROS, pancreatitis   Endo/Other  negative endocrine ROS  Renal/GU negative Renal ROS  negative genitourinary   Musculoskeletal   Abdominal   Peds  Hematology negative hematology ROS (+)   Anesthesia Other Findings   Reproductive/Obstetrics negative OB ROS                         Anesthesia Physical Anesthesia Plan  ASA: III  Anesthesia Plan: MAC   Post-op Pain Management:    Induction:   Airway Management Planned:   Additional Equipment:   Intra-op Plan:   Post-operative Plan:   Informed Consent: I have reviewed the patients History and Physical, chart, labs and discussed the procedure including the risks, benefits and alternatives for the proposed anesthesia with the patient or authorized representative who has indicated his/her understanding and acceptance.   Dental Advisory Given  Plan Discussed with: CRNA and Surgeon  Anesthesia Plan Comments:         Anesthesia Quick Evaluation

## 2014-08-13 ENCOUNTER — Encounter (HOSPITAL_COMMUNITY): Payer: Self-pay | Admitting: Gastroenterology

## 2014-09-22 ENCOUNTER — Encounter (HOSPITAL_COMMUNITY): Payer: Self-pay | Admitting: Emergency Medicine

## 2014-09-22 ENCOUNTER — Emergency Department (HOSPITAL_COMMUNITY)
Admission: EM | Admit: 2014-09-22 | Discharge: 2014-09-22 | Disposition: A | Discharge status: Home / Self Care | Payer: 59 | Attending: Emergency Medicine | Admitting: Emergency Medicine

## 2014-09-22 DIAGNOSIS — L729 Follicular cyst of the skin and subcutaneous tissue, unspecified: Secondary | ICD-10-CM | POA: Insufficient documentation

## 2014-09-22 DIAGNOSIS — Z79899 Other long term (current) drug therapy: Secondary | ICD-10-CM | POA: Insufficient documentation

## 2014-09-22 DIAGNOSIS — L089 Local infection of the skin and subcutaneous tissue, unspecified: Secondary | ICD-10-CM

## 2014-09-22 DIAGNOSIS — K219 Gastro-esophageal reflux disease without esophagitis: Secondary | ICD-10-CM | POA: Insufficient documentation

## 2014-09-22 DIAGNOSIS — L02212 Cutaneous abscess of back [any part, except buttock]: Secondary | ICD-10-CM | POA: Diagnosis present

## 2014-09-22 DIAGNOSIS — Z8739 Personal history of other diseases of the musculoskeletal system and connective tissue: Secondary | ICD-10-CM | POA: Insufficient documentation

## 2014-09-22 DIAGNOSIS — Z720 Tobacco use: Secondary | ICD-10-CM | POA: Insufficient documentation

## 2014-09-22 MED ORDER — LIDOCAINE-EPINEPHRINE (PF) 2 %-1:200000 IJ SOLN
10.0000 mL | Freq: Once | INTRAMUSCULAR | Status: AC
  Administered 2014-09-22: 10 mL via INTRADERMAL
  Filled 2014-09-22: qty 20

## 2014-09-22 MED ORDER — TRAMADOL HCL 50 MG PO TABS: 50.0000 mg | ORAL_TABLET | Freq: Four times a day (QID) | ORAL | Status: DC | PRN

## 2014-09-22 MED ORDER — CLINDAMYCIN HCL 150 MG PO CAPS: 300.0000 mg | ORAL_CAPSULE | Freq: Three times a day (TID) | ORAL | Status: DC

## 2014-09-22 NOTE — Discharge Instructions (Signed)
Take Clindamycin as directed until gone. Take Tramadol as needed for pain. Refer to attached documents for more information.

## 2014-09-22 NOTE — ED Provider Notes (Signed)
CSN: [id number]     Arrival date & time 09/22/14  1304 History   This chart was scribed for non-physician practitioner, Emilia BeckKaitlyn Shalina Norfolk, PA-C, working with Joya Gaskinsonald W Wickline, MD by Milly JakobJohn Lee Graves, ED Scribe. The patient was seen in room TR11C/TR11C. Patient's care was started at 1:34 PM.   Chief Complaint  Patient presents with  . Abscess   Patient is a 48 y.o. male presenting with abscess. The history is provided by the patient. No language interpreter was used.  Abscess Location:  Torso Torso abscess location:  Upper back Size:  4x3cm Abscess quality: painful   Abscess quality: not draining   Red streaking: no   Progression:  Worsening Pain details:    Quality:  Unable to specify   Severity:  Moderate   Timing:  Constant   Progression:  Worsening Chronicity:  New Relieved by:  Nothing Worsened by:  Nothing tried Ineffective treatments:  Oral antibiotics Associated symptoms: no fever    HPI Comments: Jordan [name] is a 48 y.o. male who presents to the Emergency Department complaining of a lesion on his back which he has had for many years and has recently become acutely painful. He states that the pain is constant, moderate, and exacerbated by palpation. He reports seeing his PCP 3-4 days ago where they tried to I&D the abscess without good results. He was prescribed ABX which he has taken without relief. He denies any other acute medical problems. He denies fever, chills, or allergies.  Past Medical History  Diagnosis Date  . Pancreatitis 12-29-2013  . GERD (gastroesophageal reflux disease)   . Arthritis     right elbow pain all the time, took cortisone shot for   Past Surgical History  Procedure Laterality Date  . No past surgeries    . Eus N/A 08/12/2014    Procedure: ESOPHAGEAL ENDOSCOPIC ULTRASOUND (EUS) RADIAL;  Surgeon: Willis ModenaWilliam Outlaw, MD;  Location: [facility];  Service: Endoscopy;  Laterality: N/A;  . Fine needle aspiration N/A 08/12/2014    Procedure: FINE NEEDLE  ASPIRATION (FNA) LINEAR;  Surgeon: Willis ModenaWilliam Outlaw, MD;  Location: [facility];  Service: Endoscopy;  Laterality: N/A;   Family History  Problem Relation Age of Onset  . Other Mother     Healthy  . Other Father     Healthy   History  Substance Use Topics  . Smoking status: Current Every Day Smoker -- 1.00 packs/day for 25 years    Types: Cigarettes  . Smokeless tobacco: Never Used  . Alcohol Use: Yes     Comment: once a month.    Review of Systems  Constitutional: Negative for fever and chills.  Skin: Positive for color change (abscess on back).  All other systems reviewed and are negative.   Allergies  Review of patient's allergies indicates no known allergies.  Home Medications   Prior to Admission medications   Medication Sig Start Date End Date Taking? Authorizing Provider  gemfibrozil (LOPID) 600 MG tablet Take 600 mg by mouth 2 (two) times daily before a meal.    Historical Provider, MD  Omega-3 Fatty Acids (FISH OIL PO) Take 1 capsule by mouth 2 (two) times daily.    Historical Provider, MD  omeprazole (PRILOSEC) 40 MG capsule Take 40 mg by mouth daily.     Historical Provider, MD   Triage Vitals: BP 137/93 mmHg  Pulse 69  Temp(Src) 97.5 F (36.4 C) (Oral)  Resp 18  SpO2 98% Physical Exam  Constitutional: He is oriented to person,  place, and time. He appears well-developed and well-nourished. No distress.  HENT:  Head: Normocephalic and atraumatic.  Eyes: Conjunctivae and EOM are normal.  Neck: Neck supple. No tracheal deviation present.  Cardiovascular: Normal rate.   Pulmonary/Chest: Effort normal. No respiratory distress.  Musculoskeletal: Normal range of motion.  Neurological: He is alert and oriented to person, place, and time.  Skin: Skin is warm and dry.  4x3 cm area of induration w/ central pustule of central thoracic back. Tender to palpation. No open wounds.  Psychiatric: He has a normal mood and affect. His behavior is normal.  Nursing note and  vitals reviewed.   ED Course  Procedures (including critical care time) DIAGNOSTIC STUDIES: Oxygen Saturation is 98% on room air, normal by my interpretation.    COORDINATION OF CARE: 1:46 PM-Discussed treatment plan which includes I&D with pt at bedside and pt agreed to plan.   INCISION AND DRAINAGE Performed by: Emilia BeckKaitlyn Willowdean Luhmann Consent: Verbal consent obtained. Risks and benefits: risks, benefits and alternatives were discussed Type: abscess  Body area: mid back  Anesthesia: local infiltration  Incision was made with a scalpel.  Local anesthetic: lidocaine 1% without epinephrine  Anesthetic total: 1 ml  Complexity: complex Blunt dissection to break up loculations  Drainage: thick, black paste followed by purulent drainage  Drainage amount: 5mL  Packing material: none  Patient tolerance: Patient tolerated the procedure well with no immediate complications.     Labs Review Labs Reviewed - No data to display  Imaging Review No results found.   EKG Interpretation None      MDM   Final diagnoses:  Infected cyst of skin    Patient's cyst drained. Patient will have clindamycin and tramadol. Vital stable and patient afebrile.   I personally performed the services described in this documentation, which was scribed in my presence. The recorded information has been reviewed and is accurate.   Emilia BeckKaitlyn Savyon Loken, PA-C 09/24/14 0049  Joya Gaskinsonald W Wickline, MD 09/24/14 1336

## 2014-09-22 NOTE — ED Notes (Signed)
Has had a lesion his back for a long time. Has recently gotten very sore. Went to PCP 3-4 days ago, who tried to I &D it without good results.

## 2014-10-08 ENCOUNTER — Other Ambulatory Visit: Payer: Self-pay | Admitting: Family Medicine

## 2014-10-08 DIAGNOSIS — K8689 Other specified diseases of pancreas: Secondary | ICD-10-CM

## 2014-10-11 ENCOUNTER — Encounter (HOSPITAL_COMMUNITY): Payer: Self-pay

## 2014-10-11 ENCOUNTER — Emergency Department (HOSPITAL_COMMUNITY)
Admission: EM | Admit: 2014-10-11 | Discharge: 2014-10-11 | Disposition: A | Discharge status: Home / Self Care | Payer: 59 | Attending: Emergency Medicine | Admitting: Emergency Medicine

## 2014-10-11 DIAGNOSIS — Z720 Tobacco use: Secondary | ICD-10-CM | POA: Insufficient documentation

## 2014-10-11 DIAGNOSIS — L040 Acute lymphadenitis of face, head and neck: Secondary | ICD-10-CM | POA: Diagnosis not present

## 2014-10-11 DIAGNOSIS — Z79899 Other long term (current) drug therapy: Secondary | ICD-10-CM | POA: Diagnosis not present

## 2014-10-11 DIAGNOSIS — M1990 Unspecified osteoarthritis, unspecified site: Secondary | ICD-10-CM | POA: Insufficient documentation

## 2014-10-11 DIAGNOSIS — K219 Gastro-esophageal reflux disease without esophagitis: Secondary | ICD-10-CM | POA: Insufficient documentation

## 2014-10-11 DIAGNOSIS — R220 Localized swelling, mass and lump, head: Secondary | ICD-10-CM | POA: Diagnosis present

## 2014-10-11 DIAGNOSIS — I889 Nonspecific lymphadenitis, unspecified: Secondary | ICD-10-CM

## 2014-10-11 MED ORDER — AMOXICILLIN-POT CLAVULANATE 875-125 MG PO TABS: 1.0000 | ORAL_TABLET | Freq: Two times a day (BID) | ORAL | Status: DC

## 2014-10-11 MED ORDER — HYDROCODONE-ACETAMINOPHEN 5-325 MG PO TABS
2.0000 | ORAL_TABLET | Freq: Once | ORAL | Status: AC
  Administered 2014-10-11: 2 via ORAL
  Filled 2014-10-11: qty 2

## 2014-10-11 MED ORDER — AMOXICILLIN-POT CLAVULANATE 875-125 MG PO TABS
1.0000 | ORAL_TABLET | Freq: Once | ORAL | Status: AC
  Administered 2014-10-11: 1 via ORAL
  Filled 2014-10-11: qty 1

## 2014-10-11 MED ORDER — HYDROCODONE-ACETAMINOPHEN 5-325 MG PO TABS: 1.0000 | ORAL_TABLET | ORAL | Status: DC | PRN

## 2014-10-11 NOTE — ED Provider Notes (Signed)
CSN: [id number]     Arrival date & time 10/11/14  1431 History   First MD Initiated Contact with Patient 10/11/14 1527     Chief Complaint  Patient presents with  . Facial Swelling     (Consider location/radiation/quality/duration/timing/severity/associated sxs/prior Treatment) HPI Comments: Patient is a 48 year old male who presents with right neck pain that started 3 days ago. The pain is throbbing and severe without radiation. Palpation and movement makes the pain worse. No alleviating factors. Patient reports associated right lateral neck swelling. No other associated symptoms. Patient denies any injury.    Past Medical History  Diagnosis Date  . Pancreatitis 12-29-2013  . GERD (gastroesophageal reflux disease)   . Arthritis     right elbow pain all the time, took cortisone shot for   Past Surgical History  Procedure Laterality Date  . No past surgeries    . Eus N/A 08/12/2014    Procedure: ESOPHAGEAL ENDOSCOPIC ULTRASOUND (EUS) RADIAL;  Surgeon: Willis ModenaWilliam Outlaw, MD;  Location: [facility];  Service: Endoscopy;  Laterality: N/A;  . Fine needle aspiration N/A 08/12/2014    Procedure: FINE NEEDLE ASPIRATION (FNA) LINEAR;  Surgeon: Willis ModenaWilliam Outlaw, MD;  Location: [facility];  Service: Endoscopy;  Laterality: N/A;   Family History  Problem Relation Age of Onset  . Other Mother     Healthy  . Other Father     Healthy   History  Substance Use Topics  . Smoking status: Current Every Day Smoker -- 1.00 packs/day for 25 years    Types: Cigarettes  . Smokeless tobacco: Never Used  . Alcohol Use: Yes     Comment: once a month.    Review of Systems  Constitutional: Negative for fever, chills and fatigue.  HENT: Negative for trouble swallowing.   Eyes: Negative for visual disturbance.  Respiratory: Negative for shortness of breath.   Cardiovascular: Negative for chest pain and palpitations.  Gastrointestinal: Negative for nausea, vomiting, abdominal pain and diarrhea.   Genitourinary: Negative for dysuria and difficulty urinating.  Musculoskeletal: Positive for neck pain. Negative for arthralgias.  Skin: Negative for color change.  Neurological: Negative for dizziness and weakness.  Psychiatric/Behavioral: Negative for dysphoric mood.      Allergies  Review of patient's allergies indicates no known allergies.  Home Medications   Prior to Admission medications   Medication Sig Start Date End Date Taking? Authorizing Provider  gemfibrozil (LOPID) 600 MG tablet Take 600 mg by mouth 2 (two) times daily before a meal.   Yes Historical Provider, MD  Omega-3 Fatty Acids (FISH OIL PO) Take 1 capsule by mouth 2 (two) times daily.   Yes Historical Provider, MD  omeprazole (PRILOSEC) 40 MG capsule Take 40 mg by mouth daily.    Yes Historical Provider, MD  traMADol (ULTRAM) 50 MG tablet Take 1 tablet (50 mg total) by mouth every 6 (six) hours as needed. Patient taking differently: Take 50 mg by mouth every 6 (six) hours as needed (pain).  09/22/14  Yes Winter Trefz, PA-C  clindamycin (CLEOCIN) 150 MG capsule Take 2 capsules (300 mg total) by mouth 3 (three) times daily. May dispense as 150mg  capsules Patient not taking: Reported on 10/11/2014 09/22/14   Brannon Decaire, PA-C   BP 132/93 mmHg  Pulse 73  Temp(Src) 98.3 F (36.8 C)  Resp 16  Ht 5\' 5"  (1.651 m)  Wt 180 lb (81.647 kg)  BMI 29.95 kg/m2  SpO2 97% Physical Exam  Constitutional: He is oriented to person, place, and time.  He appears well-developed and well-nourished. No distress.  HENT:  Head: Normocephalic and atraumatic.  Mouth/Throat: Oropharynx is clear and moist. No oropharyngeal exudate.  Eyes: Conjunctivae and EOM are normal.  Neck: Normal range of motion.  Swollen and tender right cervical lymphadenopathy.   Cardiovascular: Normal rate and regular rhythm.  Exam reveals no gallop and no friction rub.   No murmur heard. Pulmonary/Chest: Effort normal and breath sounds normal. He  has no wheezes. He has no rales. He exhibits no tenderness.  Abdominal: Soft. He exhibits no distension. There is no tenderness.  Musculoskeletal: Normal range of motion.  Lymphadenopathy:    He has cervical adenopathy.  Neurological: He is alert and oriented to person, place, and time. Coordination normal.  Speech is goal-oriented. Moves limbs without ataxia.   Skin: Skin is warm and dry.  Psychiatric: He has a normal mood and affect. His behavior is normal.  Nursing note and vitals reviewed.   ED Course  Procedures (including critical care time) Labs Review Labs Reviewed - No data to display  Imaging Review No results found.   EKG Interpretation None      MDM   Final diagnoses:  Cervical lymphadenitis    4:01 PM Patient has cervical lymphadenitis on the right. Vitals stable and patient afebrile. Patient will have augmentin and vicodin for symptoms. No further evaluation needed at this time.    Emilia BeckKaitlyn Merick Kelleher, PA-C 10/11/14 2130  Elwin MochaBlair Walden, MD 10/11/14 2308

## 2014-10-11 NOTE — Discharge Instructions (Signed)
Take augmentin as directed until gone. Take vicodin as needed for pain. Refer to attached documents for more information. Return to the ED or follow up with your doctor for worsening or concerning symptoms.

## 2014-10-11 NOTE — ED Notes (Addendum)
Pt reports having a small area of swelling to his face/neck starting 3 days ago.  Pt has swelling to R side of face just below the ear.  Pt had a cyst drained on his back on 11/03.

## 2014-10-23 ENCOUNTER — Ambulatory Visit
Admission: RE | Admit: 2014-10-23 | Discharge: 2014-10-23 | Disposition: A | Discharge status: Home / Self Care | Payer: 59 | Source: Ambulatory Visit | Attending: Family Medicine | Admitting: Family Medicine

## 2014-10-23 DIAGNOSIS — K8689 Other specified diseases of pancreas: Secondary | ICD-10-CM

## 2014-10-23 MED ORDER — GADOBENATE DIMEGLUMINE 529 MG/ML IV SOLN
17.0000 mL | Freq: Once | INTRAVENOUS | Status: AC | PRN
  Administered 2014-10-23: 17 mL via INTRAVENOUS

## 2014-10-28 ENCOUNTER — Encounter (HOSPITAL_COMMUNITY): Payer: Self-pay | Admitting: Adult Health

## 2014-10-28 ENCOUNTER — Emergency Department (HOSPITAL_COMMUNITY)
Admission: EM | Admit: 2014-10-28 | Discharge: 2014-10-28 | Disposition: A | Discharge status: Home / Self Care | Payer: 59 | Attending: Emergency Medicine | Admitting: Emergency Medicine

## 2014-10-28 ENCOUNTER — Emergency Department (HOSPITAL_COMMUNITY): Payer: 59

## 2014-10-28 DIAGNOSIS — Z792 Long term (current) use of antibiotics: Secondary | ICD-10-CM | POA: Diagnosis not present

## 2014-10-28 DIAGNOSIS — M13821 Other specified arthritis, right elbow: Secondary | ICD-10-CM | POA: Diagnosis not present

## 2014-10-28 DIAGNOSIS — S8992XA Unspecified injury of left lower leg, initial encounter: Secondary | ICD-10-CM | POA: Insufficient documentation

## 2014-10-28 DIAGNOSIS — Y9389 Activity, other specified: Secondary | ICD-10-CM | POA: Insufficient documentation

## 2014-10-28 DIAGNOSIS — Z79899 Other long term (current) drug therapy: Secondary | ICD-10-CM | POA: Insufficient documentation

## 2014-10-28 DIAGNOSIS — K219 Gastro-esophageal reflux disease without esophagitis: Secondary | ICD-10-CM | POA: Diagnosis not present

## 2014-10-28 DIAGNOSIS — Z720 Tobacco use: Secondary | ICD-10-CM | POA: Insufficient documentation

## 2014-10-28 DIAGNOSIS — Y998 Other external cause status: Secondary | ICD-10-CM | POA: Insufficient documentation

## 2014-10-28 DIAGNOSIS — Y92410 Unspecified street and highway as the place of occurrence of the external cause: Secondary | ICD-10-CM | POA: Diagnosis not present

## 2014-10-28 DIAGNOSIS — R52 Pain, unspecified: Secondary | ICD-10-CM

## 2014-10-28 DIAGNOSIS — M25562 Pain in left knee: Secondary | ICD-10-CM

## 2014-10-28 MED ORDER — HYDROCODONE-ACETAMINOPHEN 5-325 MG PO TABS: 1.0000 | ORAL_TABLET | Freq: Four times a day (QID) | ORAL | Status: DC | PRN

## 2014-10-28 MED ORDER — MELOXICAM 7.5 MG PO TABS: 7.5000 mg | ORAL_TABLET | Freq: Every day | ORAL | Status: DC
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
## 2014-12-25 ENCOUNTER — Emergency Department (HOSPITAL_COMMUNITY)
Admission: EM | Admit: 2014-12-25 | Discharge: 2014-12-25 | Disposition: A | Discharge status: Home / Self Care | Payer: 59 | Attending: Emergency Medicine | Admitting: Emergency Medicine

## 2014-12-25 ENCOUNTER — Encounter (HOSPITAL_COMMUNITY): Payer: Self-pay | Admitting: Emergency Medicine

## 2014-12-25 DIAGNOSIS — K219 Gastro-esophageal reflux disease without esophagitis: Secondary | ICD-10-CM | POA: Insufficient documentation

## 2014-12-25 DIAGNOSIS — R0602 Shortness of breath: Secondary | ICD-10-CM | POA: Diagnosis not present

## 2014-12-25 DIAGNOSIS — Z8739 Personal history of other diseases of the musculoskeletal system and connective tissue: Secondary | ICD-10-CM | POA: Insufficient documentation

## 2014-12-25 DIAGNOSIS — Z79899 Other long term (current) drug therapy: Secondary | ICD-10-CM | POA: Diagnosis not present

## 2014-12-25 DIAGNOSIS — Z720 Tobacco use: Secondary | ICD-10-CM | POA: Insufficient documentation

## 2014-12-25 DIAGNOSIS — K858 Other acute pancreatitis without necrosis or infection: Secondary | ICD-10-CM

## 2014-12-25 DIAGNOSIS — R1011 Right upper quadrant pain: Secondary | ICD-10-CM | POA: Diagnosis present

## 2014-12-25 LAB — CBC WITH DIFFERENTIAL/PLATELET
BASOS ABS: 0 10*3/uL (ref 0.0–0.1)
BASOS PCT: 0 % (ref 0–1)
Eosinophils Absolute: 0.1 10*3/uL (ref 0.0–0.7)
Eosinophils Relative: 2 % (ref 0–5)
HEMATOCRIT: 42.8 % (ref 39.0–52.0)
HEMOGLOBIN: 14.2 g/dL (ref 13.0–17.0)
LYMPHS PCT: 47 % — AB (ref 12–46)
Lymphs Abs: 3.3 10*3/uL (ref 0.7–4.0)
MCH: 27.7 pg (ref 26.0–34.0)
MCHC: 33.2 g/dL (ref 30.0–36.0)
MCV: 83.4 fL (ref 78.0–100.0)
MONOS PCT: 8 % (ref 3–12)
Monocytes Absolute: 0.5 10*3/uL (ref 0.1–1.0)
NEUTROS ABS: 3.1 10*3/uL (ref 1.7–7.7)
NEUTROS PCT: 44 % (ref 43–77)
Platelets: 393 10*3/uL (ref 150–400)
RBC: 5.13 MIL/uL (ref 4.22–5.81)
RDW: 16.7 % — AB (ref 11.5–15.5)
WBC: 7 10*3/uL (ref 4.0–10.5)

## 2014-12-25 LAB — CBG MONITORING, ED: GLUCOSE-CAPILLARY: 129 mg/dL — AB (ref 70–99)

## 2014-12-25 LAB — URINALYSIS, ROUTINE W REFLEX MICROSCOPIC
Bilirubin Urine: NEGATIVE
Glucose, UA: NEGATIVE mg/dL
KETONES UR: NEGATIVE mg/dL
Leukocytes, UA: NEGATIVE
NITRITE: NEGATIVE
PH: 6.5 (ref 5.0–8.0)
Protein, ur: NEGATIVE mg/dL
SPECIFIC GRAVITY, URINE: 1.026 (ref 1.005–1.030)
Urobilinogen, UA: 1 mg/dL (ref 0.0–1.0)

## 2014-12-25 LAB — URINE MICROSCOPIC-ADD ON

## 2014-12-25 MED ORDER — ONDANSETRON HCL 4 MG/2ML IJ SOLN
4.0000 mg | Freq: Once | INTRAMUSCULAR | Status: AC
  Administered 2014-12-25: 4 mg via INTRAVENOUS
  Filled 2014-12-25: qty 2

## 2014-12-25 MED ORDER — HYDROMORPHONE HCL 1 MG/ML IJ SOLN
1.0000 mg | Freq: Once | INTRAMUSCULAR | Status: AC
  Administered 2014-12-25: 1 mg via INTRAVENOUS
  Filled 2014-12-25: qty 1

## 2014-12-25 MED ORDER — SODIUM CHLORIDE 0.9 % IV BOLUS (SEPSIS)
1000.0000 mL | Freq: Once | INTRAVENOUS | Status: AC
Start: 2014-12-25 — End: 2014-12-25
  Administered 2014-12-25: 1000 mL via INTRAVENOUS

## 2014-12-25 MED ORDER — OXYCODONE-ACETAMINOPHEN 5-325 MG PO TABS: 2.0000 | ORAL_TABLET | ORAL | Status: DC | PRN

## 2014-12-25 NOTE — ED Notes (Signed)
Rasheeda from lab called about blood samples resent by phlebotomy.  Sample is still too hemolyzed and full of fat to process.  Dr. Bebe ShaggyWickline made aware, no labs to be re-drawn at this time.

## 2014-12-25 NOTE — ED Notes (Signed)
Pt. reports upper abdominal pain with emesis onset this morning , denies diarrhea , no fever or chills.

## 2014-12-25 NOTE — Discharge Instructions (Signed)
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
## 2014-12-25 NOTE — ED Provider Notes (Signed)
CSN: [id number]     Arrival date & time 12/25/14  0011 History  This chart was scribed for Jordan Gaskinsonald W Noach Calvillo, MD by Richarda Overlieichard Holland, ED Scribe. This patient was seen in room B19C/B19C and the patient's care was started 12:57 AM.     Chief Complaint  Patient presents with  . Abdominal Pain   Patient is a 49 y.o. male presenting with abdominal pain. The history is provided by the patient. No language interpreter was used.  Abdominal Pain Pain location:  RUQ Pain radiates to:  Does not radiate Pain severity:  Moderate Duration:  3 hours Timing:  Constant Progression:  Unable to specify Chronicity:  Recurrent Worsened by:  Deep breathing Associated symptoms: nausea, shortness of breath and vomiting   Associated symptoms: no diarrhea and no dysuria    HPI Comments: Jordan [name] is a 49 y.o. male with a history of pancreatitis and GERD who presents to the Emergency Department complaining of RUQ pain that started at 9PM last night. He reports associated SOB due to pain, nausea and vomiting and describes it as clear without blood. Pt states that he has felt this pain before when his pancreatitis flares. He states that his last flare up was 1 year ago. He denies diarrhea, dysuria, weakness or numbness in extremities and CP.   Past Medical History  Diagnosis Date  . Pancreatitis 12-29-2013  . GERD (gastroesophageal reflux disease)   . Arthritis     right elbow pain all the time, took cortisone shot for   Past Surgical History  Procedure Laterality Date  . No past surgeries    . Eus N/A 08/12/2014    Procedure: ESOPHAGEAL ENDOSCOPIC ULTRASOUND (EUS) RADIAL;  Surgeon: Willis ModenaWilliam Outlaw, MD;  Location: [facility];  Service: Endoscopy;  Laterality: N/A;  . Fine needle aspiration N/A 08/12/2014    Procedure: FINE NEEDLE ASPIRATION (FNA) LINEAR;  Surgeon: Willis ModenaWilliam Outlaw, MD;  Location: [facility];  Service: Endoscopy;  Laterality: N/A;   Family History  Problem Relation Age of Onset  . Other  Mother     Healthy  . Other Father     Healthy   History  Substance Use Topics  . Smoking status: Current Every Day Smoker -- 1.00 packs/day for 25 years    Types: Cigarettes  . Smokeless tobacco: Never Used  . Alcohol Use: Yes     Comment: once a month.    Review of Systems  Respiratory: Positive for shortness of breath.   Gastrointestinal: Positive for nausea, vomiting and abdominal pain. Negative for diarrhea.  Genitourinary: Negative for dysuria.  All other systems reviewed and are negative.     Allergies  Review of patient's allergies indicates no known allergies.  Home Medications   Prior to Admission medications   Medication Sig Start Date End Date Taking? Authorizing Provider  gemfibrozil (LOPID) 600 MG tablet Take 600 mg by mouth 2 (two) times daily before a meal.   Yes Historical Provider, MD  ibuprofen (ADVIL,MOTRIN) 200 MG tablet Take 200 mg by mouth every 6 (six) hours as needed for moderate pain.   Yes Historical Provider, MD  Omega-3 Fatty Acids (FISH OIL PO) Take 1 capsule by mouth 2 (two) times daily.   Yes Historical Provider, MD  omeprazole (PRILOSEC) 40 MG capsule Take 40 mg by mouth daily.    Yes Historical Provider, MD   BP 162/78 mmHg  Pulse 55  Temp(Src) 97.8 F (36.6 C) (Oral)  Resp 16  SpO2 98% Physical Exam CONSTITUTIONAL: Well developed/well  nourished HEAD: Normocephalic/atraumatic EYES: EOMI/PERRL. No icterus.  ENMT: Mucous membranes moist NECK: supple no meningeal signs SPINE/BACK:entire spine nontender CV: S1/S2 noted, no murmurs/rubs/gallops noted LUNGS: Lungs are clear to auscultation bilaterally, no apparent distress ABDOMEN: soft, moderate RUQ/epigastric tenderness, no rebound or guarding, bowel sounds noted throughout abdomen GU:no cva tenderness NEURO: Pt is awake/alert/appropriate, moves all extremitiesx4.  No facial droop.   EXTREMITIES: pulses normal/equal, full ROM SKIN: warm, color normal PSYCH: no abnormalities of mood  noted, alert and oriented to situation  ED Course  Procedures   DIAGNOSTIC STUDIES: Oxygen Saturation is 98% on RA, normal by my interpretation.    COORDINATION OF CARE: 1:02 AM Discussed treatment plan with pt at bedside and pt agreed to plan. 3:20 AM Review of chart reveals pt has h/o recurrent pancreatitis and triglycerides>1000.  Suspect pancreatitis is caused by elevated triglycerides chronically.  Labs difficult to result (hemolyzed) likely due to TG.   However he is well appearing, abdomen soft and he is nontoxic No leukocytosis He admits pain similar to prior attacks of pancreatitis Will treat pain, give PO and reassess Will defer imaging for now 4:41 AM Pt improved He is sleeping and in no distress His abdomen is soft without focal tenderness Clinically he has mild acute pancreatitis I feel he is appropriate for d/c home Discussed importance of followup and triglyceride control as this is likely triggering his pancreatitis We discussed strict return precautions Labs Review Labs Reviewed  CBC WITH DIFFERENTIAL/PLATELET - Abnormal; Notable for the following:    RDW 16.7 (*)    Lymphocytes Relative 47 (*)    All other components within normal limits  URINALYSIS, ROUTINE W REFLEX MICROSCOPIC - Abnormal; Notable for the following:    Hgb urine dipstick TRACE (*)    All other components within normal limits  CBG MONITORING, ED - Abnormal; Notable for the following:    Glucose-Capillary 129 (*)    All other components within normal limits  URINE MICROSCOPIC-ADD ON     EKG Interpretation   Date/Time:  Friday December 25 2014 01:07:38 EST Ventricular Rate:  57 PR Interval:  177 QRS Duration: 85 QT Interval:  483 QTC Calculation: 470 R Axis:   65 Text Interpretation:  Sinus rhythm Left atrial enlargement ST elev,  probable normal early repol pattern No significant change since last  tracing Confirmed by Bebe Shaggy  MD, Joanann Mies ([id number]) on 12/25/2014 1:37:38 AM      Medications  HYDROmorphone (DILAUDID) injection 1 mg (1 mg Intravenous Given 12/25/14 0109)  ondansetron (ZOFRAN) injection 4 mg (4 mg Intravenous Given 12/25/14 0110)  sodium chloride 0.9 % bolus 1,000 mL (0 mLs Intravenous Stopped 12/25/14 0237)  HYDROmorphone (DILAUDID) injection 1 mg (1 mg Intravenous Given 12/25/14 0327)  ondansetron (ZOFRAN) injection 4 mg (4 mg Intravenous Given 12/25/14 0326)    MDM   Final diagnoses:  Other acute pancreatitis    Nursing notes including past medical history and social history reviewed and considered in documentation Labs/vital reviewed myself and considered during evaluation Previous records reviewed and considered   I personally performed the services described in this documentation, which was scribed in my presence. The recorded information has been reviewed and is accurate.      Jordan Gaskins, MD 12/25/14 [phone]

## 2014-12-25 NOTE — ED Notes (Signed)
Per lab, green top was "grossly hemolyzed" and will require redraw.  Phlebotomy notified.

## 2015-02-27 ENCOUNTER — Emergency Department (HOSPITAL_COMMUNITY)
Admission: EM | Admit: 2015-02-27 | Discharge: 2015-02-27 | Disposition: A | Discharge status: Home / Self Care | Payer: 59 | Attending: Emergency Medicine | Admitting: Emergency Medicine

## 2015-02-27 ENCOUNTER — Encounter (HOSPITAL_COMMUNITY): Payer: Self-pay | Admitting: *Deleted

## 2015-02-27 DIAGNOSIS — R109 Unspecified abdominal pain: Secondary | ICD-10-CM

## 2015-02-27 DIAGNOSIS — K219 Gastro-esophageal reflux disease without esophagitis: Secondary | ICD-10-CM | POA: Diagnosis not present

## 2015-02-27 DIAGNOSIS — Z8739 Personal history of other diseases of the musculoskeletal system and connective tissue: Secondary | ICD-10-CM | POA: Insufficient documentation

## 2015-02-27 DIAGNOSIS — Z720 Tobacco use: Secondary | ICD-10-CM | POA: Insufficient documentation

## 2015-02-27 DIAGNOSIS — R1013 Epigastric pain: Secondary | ICD-10-CM | POA: Diagnosis not present

## 2015-02-27 DIAGNOSIS — Z79899 Other long term (current) drug therapy: Secondary | ICD-10-CM | POA: Insufficient documentation

## 2015-02-27 LAB — CBC WITH DIFFERENTIAL/PLATELET
BASOS PCT: 0 % (ref 0–1)
Basophils Absolute: 0 10*3/uL (ref 0.0–0.1)
EOS ABS: 0.1 10*3/uL (ref 0.0–0.7)
EOS PCT: 2 % (ref 0–5)
HEMATOCRIT: 41.2 % (ref 39.0–52.0)
Hemoglobin: 13.6 g/dL (ref 13.0–17.0)
Lymphocytes Relative: 40 % (ref 12–46)
Lymphs Abs: 2.4 10*3/uL (ref 0.7–4.0)
MCH: 26.8 pg (ref 26.0–34.0)
MCHC: 33 g/dL (ref 30.0–36.0)
MCV: 81.1 fL (ref 78.0–100.0)
MONO ABS: 1.2 10*3/uL — AB (ref 0.1–1.0)
Monocytes Relative: 19 % — ABNORMAL HIGH (ref 3–12)
NEUTROS ABS: 2.4 10*3/uL (ref 1.7–7.7)
NEUTROS PCT: 39 % — AB (ref 43–77)
Platelets: 308 10*3/uL (ref 150–400)
RBC: 5.08 MIL/uL (ref 4.22–5.81)
RDW: 15.4 % (ref 11.5–15.5)
WBC: 6.1 10*3/uL (ref 4.0–10.5)

## 2015-02-27 MED ORDER — PANTOPRAZOLE SODIUM 40 MG IV SOLR
40.0000 mg | Freq: Once | INTRAVENOUS | Status: AC
  Administered 2015-02-27: 40 mg via INTRAVENOUS
  Filled 2015-02-27: qty 40

## 2015-02-27 MED ORDER — SODIUM CHLORIDE 0.9 % IV SOLN
1000.0000 mL | Freq: Once | INTRAVENOUS | Status: AC
  Administered 2015-02-27: 1000 mL via INTRAVENOUS

## 2015-02-27 MED ORDER — METOCLOPRAMIDE HCL 5 MG/ML IJ SOLN
10.0000 mg | Freq: Once | INTRAMUSCULAR | Status: DC
  Filled 2015-02-27: qty 2

## 2015-02-27 MED ORDER — HYDROMORPHONE HCL 1 MG/ML IJ SOLN
1.0000 mg | Freq: Once | INTRAMUSCULAR | Status: AC
  Administered 2015-02-27: 1 mg via INTRAVENOUS
  Filled 2015-02-27: qty 1

## 2015-02-27 MED ORDER — SODIUM CHLORIDE 0.9 % IV SOLN
1000.0000 mL | INTRAVENOUS | Status: DC
  Administered 2015-02-27: 1000 mL via INTRAVENOUS

## 2015-02-27 MED ORDER — ONDANSETRON HCL 4 MG/2ML IJ SOLN
4.0000 mg | Freq: Once | INTRAMUSCULAR | Status: AC
  Administered 2015-02-27: 4 mg via INTRAVENOUS
  Filled 2015-02-27: qty 2

## 2015-02-27 NOTE — ED Provider Notes (Signed)
CSN: [id number]     Arrival date & time 02/27/15  0137 History  This chart was scribed for Jordan Boozeavid Laraina Sulton, MD by Modena JanskyAlbert Thayil, ED Scribe. This patient was seen in room A08C/A08C and the patient's care was started at 2:06 AM.   Chief Complaint  Patient presents with  . Abdominal Pain   The history is provided by the patient. No language interpreter was used.   HPI Comments: Jordan [name] is a 49 y.o. male with a hx of pancreatitis who presents to the Emergency Department complaining of constant moderate epigastric abdominal pain that started about 10 hours ago. He states that his epigastric pain that  shoots to his back side started about 10 hours ago. He reports that he fell asleep and woke up with nausea and worsening pain. He currently rates the severity of the pain as a 7/10. He reports no modifying factors. He denies any recent alcohol use.   Past Medical History  Diagnosis Date  . Pancreatitis 12-29-2013  . GERD (gastroesophageal reflux disease)   . Arthritis     right elbow pain all the time, took cortisone shot for   Past Surgical History  Procedure Laterality Date  . No past surgeries    . Eus N/A 08/12/2014    Procedure: ESOPHAGEAL ENDOSCOPIC ULTRASOUND (EUS) RADIAL;  Surgeon: Willis ModenaWilliam Outlaw, MD;  Location: [facility];  Service: Endoscopy;  Laterality: N/A;  . Fine needle aspiration N/A 08/12/2014    Procedure: FINE NEEDLE ASPIRATION (FNA) LINEAR;  Surgeon: Willis ModenaWilliam Outlaw, MD;  Location: [facility];  Service: Endoscopy;  Laterality: N/A;   Family History  Problem Relation Age of Onset  . Other Mother     Healthy  . Other Father     Healthy   History  Substance Use Topics  . Smoking status: Current Every Day Smoker -- 1.00 packs/day for 25 years    Types: Cigarettes  . Smokeless tobacco: Never Used  . Alcohol Use: Yes     Comment: once a month.    Review of Systems  Gastrointestinal: Positive for nausea and abdominal pain.  Musculoskeletal: Positive for back pain.   All other systems reviewed and are negative.  Allergies  Review of patient's allergies indicates no known allergies.  Home Medications   Prior to Admission medications   Medication Sig Start Date End Date Taking? Authorizing Provider  gemfibrozil (LOPID) 600 MG tablet Take 600 mg by mouth 2 (two) times daily before a meal.   Yes Historical Provider, MD  Omega-3 Fatty Acids (FISH OIL PO) Take 1 capsule by mouth 2 (two) times daily.   Yes Historical Provider, MD  omeprazole (PRILOSEC) 40 MG capsule Take 40 mg by mouth daily.    Yes Historical Provider, MD  promethazine (PHENERGAN) 25 MG tablet Take 25 mg by mouth every 6 (six) hours as needed for nausea or vomiting.   Yes Historical Provider, MD  oxyCODONE-acetaminophen (PERCOCET/ROXICET) 5-325 MG per tablet Take 2 tablets by mouth every 4 (four) hours as needed for severe pain. Patient not taking: Reported on 02/27/2015 12/25/14   Zadie Rhineonald Wickline, MD   BP 171/107 mmHg  Pulse 66  Temp(Src) 97.4 F (36.3 C) (Oral)  Resp 16  Wt 188 lb (85.276 kg)  SpO2 97% Physical Exam  Constitutional: He is oriented to person, place, and time. He appears well-developed and well-nourished. No distress.  HENT:  Head: Normocephalic and atraumatic.  Neck: Neck supple. No tracheal deviation present.  Cardiovascular: Normal rate.   Pulmonary/Chest: Effort normal. No  respiratory distress.  Abdominal: Soft. There is tenderness. There is no rebound and no guarding.  Moderate epigastric TTP with no rebound or guarding. Bowels sounds decreased.   Musculoskeletal: Normal range of motion.  Neurological: He is alert and oriented to person, place, and time.  Skin: Skin is warm and dry.  Psychiatric: He has a normal mood and affect. His behavior is normal.  Nursing note and vitals reviewed.   ED Course  Procedures (including critical care time) DIAGNOSTIC STUDIES: Oxygen Saturation is 97% on RA, normal by my interpretation.    COORDINATION OF CARE: 2:10 AM-  Pt advised of plan for treatment which includes medication and labs and pt agrees.  Labs Review Results for orders placed or performed during the hospital encounter of 02/27/15  CBC with Differential/Platelet  Result Value Ref Range   WBC 6.1 4.0 - 10.5 K/uL   RBC 5.08 4.22 - 5.81 MIL/uL   Hemoglobin 13.6 13.0 - 17.0 g/dL   HCT 20.2 54.2 - 70.6 %   MCV 81.1 78.0 - 100.0 fL   MCH 26.8 26.0 - 34.0 pg   MCHC 33.0 30.0 - 36.0 g/dL   RDW 23.7 62.8 - 31.5 %   Platelets 308 150 - 400 K/uL   Neutrophils Relative % 39 (L) 43 - 77 %   Lymphocytes Relative 40 12 - 46 %   Monocytes Relative 19 (H) 3 - 12 %   Eosinophils Relative 2 0 - 5 %   Basophils Relative 0 0 - 1 %   Neutro Abs 2.4 1.7 - 7.7 K/uL   Lymphs Abs 2.4 0.7 - 4.0 K/uL   Monocytes Absolute 1.2 (H) 0.1 - 1.0 K/uL   Eosinophils Absolute 0.1 0.0 - 0.7 K/uL   Basophils Absolute 0.0 0.0 - 0.1 K/uL   Smear Review MORPHOLOGY UNREMARKABLE     MDM   Final diagnoses:  Abdominal pain, unspecified abdominal location    Abdominal pain consistent with pancreatitis. Patient describes a history of pancreatitis but apparently is idiopathic. Old records are reviewed and he has a prior ED visit diagnosis of pancreatitis, but lipase was normal at that time. He is given IV fluids and IV narcotics following which has become completely pain-free. CBC is normal. 2 attempts to draw chemistries have 2 samples that were unable to be used in the laboratory. Patient stated that he was pain-free and did not wish any narcotics to be prescribed for him, so he was discharged without chemistry labs having been obtained.   I personally performed the services described in this documentation, which was scribed in my presence. The recorded information has been reviewed and is accurate.      Jordan Booze, MD 02/27/15 2308

## 2015-02-27 NOTE — Discharge Instructions (Signed)

## 2015-02-27 NOTE — ED Notes (Signed)
Pt requesting to leave, states he does not want to wait for lab requests. Explained delays and apologized to patient. States he wants to leave anyway. Dr. Preston FleetingGlick notified.

## 2015-02-27 NOTE — ED Notes (Signed)
The pt is c/o  abd pain with nausea since 1630.  Hx pancreatitis

## 2015-03-02 ENCOUNTER — Encounter (HOSPITAL_COMMUNITY): Payer: Self-pay | Admitting: Emergency Medicine

## 2015-03-02 ENCOUNTER — Emergency Department (HOSPITAL_COMMUNITY): Payer: 59

## 2015-03-02 ENCOUNTER — Emergency Department (HOSPITAL_COMMUNITY)
Admission: EM | Admit: 2015-03-02 | Discharge: 2015-03-02 | Disposition: A | Discharge status: Home / Self Care | Payer: 59 | Attending: Emergency Medicine | Admitting: Emergency Medicine

## 2015-03-02 DIAGNOSIS — M1990 Unspecified osteoarthritis, unspecified site: Secondary | ICD-10-CM | POA: Insufficient documentation

## 2015-03-02 DIAGNOSIS — K859 Acute pancreatitis, unspecified: Secondary | ICD-10-CM | POA: Insufficient documentation

## 2015-03-02 DIAGNOSIS — K219 Gastro-esophageal reflux disease without esophagitis: Secondary | ICD-10-CM | POA: Diagnosis not present

## 2015-03-02 DIAGNOSIS — E781 Pure hyperglyceridemia: Secondary | ICD-10-CM | POA: Insufficient documentation

## 2015-03-02 DIAGNOSIS — Z720 Tobacco use: Secondary | ICD-10-CM | POA: Insufficient documentation

## 2015-03-02 DIAGNOSIS — R1013 Epigastric pain: Secondary | ICD-10-CM | POA: Diagnosis present

## 2015-03-02 DIAGNOSIS — Z79899 Other long term (current) drug therapy: Secondary | ICD-10-CM | POA: Diagnosis not present

## 2015-03-02 LAB — CBC WITH DIFFERENTIAL/PLATELET
Basophils Absolute: 0 10*3/uL (ref 0.0–0.1)
Basophils Relative: 0 % (ref 0–1)
Eosinophils Absolute: 0.1 10*3/uL (ref 0.0–0.7)
Eosinophils Relative: 1 % (ref 0–5)
HCT: 43 % (ref 39.0–52.0)
Hemoglobin: 15 g/dL (ref 13.0–17.0)
Lymphocytes Relative: 31 % (ref 12–46)
Lymphs Abs: 2.6 10*3/uL (ref 0.7–4.0)
MCH: 27.8 pg (ref 26.0–34.0)
MCHC: 34.9 g/dL (ref 30.0–36.0)
MCV: 79.8 fL (ref 78.0–100.0)
Monocytes Absolute: 0.5 10*3/uL (ref 0.1–1.0)
Monocytes Relative: 6 % (ref 3–12)
Neutro Abs: 5.3 10*3/uL (ref 1.7–7.7)
Neutrophils Relative %: 62 % (ref 43–77)
Platelets: 322 10*3/uL (ref 150–400)
RBC: 5.39 MIL/uL (ref 4.22–5.81)
RDW: 14.9 % (ref 11.5–15.5)
WBC: 8.5 10*3/uL (ref 4.0–10.5)

## 2015-03-02 LAB — COMPREHENSIVE METABOLIC PANEL
Albumin: 4.1 g/dL (ref 3.5–5.2)
Alkaline Phosphatase: 33 U/L — ABNORMAL LOW (ref 39–117)
Anion gap: 10 (ref 5–15)
BUN: 11 mg/dL (ref 6–23)
CO2: 21 mmol/L (ref 19–32)
Calcium: 9 mg/dL (ref 8.4–10.5)
Chloride: 103 mmol/L (ref 96–112)
Creatinine, Ser: 0.88 mg/dL (ref 0.50–1.35)
GFR calc Af Amer: >90 mL/min (ref >90)
GFR calc non Af Amer: >90 mL/min (ref >90)
Glucose, Bld: 92 mg/dL (ref 70–99)
Sodium: 134 mmol/L — ABNORMAL LOW (ref 135–145)
Total Bilirubin: 5.7 mg/dL — ABNORMAL HIGH (ref 0.3–1.2)

## 2015-03-02 LAB — I-STAT CHEM 8, ED
BUN: 15 mg/dL (ref 6–23)
Calcium, Ion: 1.14 mmol/L (ref 1.12–1.23)
Chloride: 104 mmol/L (ref 96–112)
Creatinine, Ser: 0.9 mg/dL (ref 0.50–1.35)
Glucose, Bld: 93 mg/dL (ref 70–99)
HCT: 48 % (ref 39.0–52.0)
Hemoglobin: 16.3 g/dL (ref 13.0–17.0)
Potassium: 4.1 mmol/L (ref 3.5–5.1)
Sodium: 139 mmol/L (ref 135–145)
TCO2: 24 mmol/L (ref 0–100)

## 2015-03-02 LAB — URINE MICROSCOPIC-ADD ON

## 2015-03-02 LAB — URINALYSIS, ROUTINE W REFLEX MICROSCOPIC
Glucose, UA: NEGATIVE mg/dL
Hgb urine dipstick: NEGATIVE
Ketones, ur: NEGATIVE mg/dL
Nitrite: NEGATIVE
Protein, ur: 30 mg/dL — AB
Specific Gravity, Urine: 1.027 (ref 1.005–1.030)
Urobilinogen, UA: 1 mg/dL (ref 0.0–1.0)
pH: 7 (ref 5.0–8.0)

## 2015-03-02 LAB — TRIGLYCERIDES: Triglycerides: 620 mg/dL — ABNORMAL HIGH (ref <150)

## 2015-03-02 LAB — LIPASE, BLOOD: Lipase: 198 U/L — ABNORMAL HIGH (ref 11–59)

## 2015-03-02 MED ORDER — NICOTINE 21 MG/24HR TD PT24
21.0000 mg | MEDICATED_PATCH | Freq: Once | TRANSDERMAL | Status: DC
  Administered 2015-03-02: 21 mg via TRANSDERMAL
  Filled 2015-03-02: qty 1

## 2015-03-02 MED ORDER — HYDROMORPHONE HCL 1 MG/ML IJ SOLN
1.5000 mg | Freq: Once | INTRAMUSCULAR | Status: AC
  Administered 2015-03-02: 1.5 mg via INTRAVENOUS
  Filled 2015-03-02: qty 2

## 2015-03-02 MED ORDER — HYDROMORPHONE HCL 1 MG/ML IJ SOLN
1.0000 mg | Freq: Once | INTRAMUSCULAR | Status: AC
  Administered 2015-03-02: 1 mg via INTRAVENOUS
  Filled 2015-03-02: qty 1

## 2015-03-02 MED ORDER — IOHEXOL 300 MG/ML  SOLN
25.0000 mL | Freq: Once | INTRAMUSCULAR | Status: AC | PRN
  Administered 2015-03-02: 25 mL via ORAL

## 2015-03-02 MED ORDER — IOHEXOL 300 MG/ML  SOLN
100.0000 mL | Freq: Once | INTRAMUSCULAR | Status: AC | PRN
  Administered 2015-03-02: 100 mL via INTRAVENOUS

## 2015-03-02 MED ORDER — ONDANSETRON HCL 4 MG/2ML IJ SOLN
4.0000 mg | Freq: Once | INTRAMUSCULAR | Status: AC
  Administered 2015-03-02: 4 mg via INTRAVENOUS
  Filled 2015-03-02: qty 2

## 2015-03-02 MED ORDER — SODIUM CHLORIDE 0.9 % IV BOLUS (SEPSIS)
1000.0000 mL | Freq: Once | INTRAVENOUS | Status: AC
  Administered 2015-03-02: 1000 mL via INTRAVENOUS

## 2015-03-02 MED ORDER — OXYCODONE-ACETAMINOPHEN 5-325 MG PO TABS: 1.0000 | ORAL_TABLET | ORAL | Status: DC | PRN

## 2015-03-02 MED ORDER — NIACIN ER 500 MG PO CPCR: 500.0000 mg | ORAL_CAPSULE | Freq: Every day | ORAL | Status: DC

## 2015-03-02 NOTE — ED Notes (Addendum)
PT reports SOB and epigastic pain radiating to back. Was recently here. States he has seen GI MD and told it's hereditary and denies drinking alcohol. States they discussed a surgery and was scoped.

## 2015-03-02 NOTE — ED Notes (Signed)
Phlebotomy aware of need for repeat draw of CMET

## 2015-03-02 NOTE — ED Notes (Signed)
Pt comfortable with discharge and follow up instructions. Pt declines wheelchair, escorted to waiting area by this RN. Prescriptions x2.  Pt declines discharge vital signs.

## 2015-03-02 NOTE — ED Notes (Signed)
Patient returned from CT

## 2015-03-02 NOTE — ED Notes (Signed)
Contacted Velna HatchetSheila in lab regarding delay in CMET and Lipase.  States CMET is hemolyzed/lipemic again but will result as much of the labwork as posible.

## 2015-03-02 NOTE — Discharge Instructions (Signed)
Acute Pancreatitis °Acute pancreatitis is a disease in which the pancreas becomes suddenly irritated (inflamed). The pancreas is a large gland behind your stomach. The pancreas makes enzymes that help digest food. The pancreas also makes 2 hormones that help control your blood sugar. Acute pancreatitis happens when the enzymes attack and damage the pancreas. Most attacks last a couple of days and can cause serious problems. °HOME CARE °· Follow your doctor's diet instructions. You may need to avoid alcohol and limit fat in your diet. °· Eat small meals often. °· Drink enough fluids to keep your pee (urine) clear or pale yellow. °· Only take medicines as told by your doctor. °· Avoid drinking alcohol if it caused your disease. °· Do not smoke. °· Get plenty of rest. °· Check your blood sugar at home as told by your doctor. °· Keep all doctor visits as told. °GET HELP IF: °· You do not get better as quickly as expected. °· You have new or worsening symptoms. °· You have lasting pain, weakness, or feel sick to your stomach (nauseous). °· You get better and then have another pain attack. °GET HELP RIGHT AWAY IF:  °· You are unable to eat or keep fluids down. °· Your pain becomes severe. °· You have a fever or lasting symptoms for more than 2 to 3 days. °· You have a fever and your symptoms suddenly get worse. °· Your skin or the white part of your eyes turn yellow (jaundice). °· You throw up (vomit). °· You feel dizzy, or you pass out (faint). °· Your blood sugar is high (over 300 mg/dL). °MAKE SURE YOU:  °· Understand these instructions. °· Will watch your condition. °· Will get help right away if you are not doing well or get worse. °Document Released: 04/24/2008 Document Revised: 03/23/2014 Document Reviewed: 02/15/2012 °ExitCare® Patient Information ©2015 [employer]. This information is not intended to replace advice given to you by your health care provider. Make sure you discuss any questions you have with your  health care provider. ° °

## 2015-03-02 NOTE — ED Provider Notes (Signed)
CSN: [id number]     Arrival date & time 03/02/15  4098 History   First MD Initiated Contact with Patient 03/02/15 0915     Chief Complaint  Patient presents with  . Abdominal Pain  . Shortness of Breath     (Consider location/radiation/quality/duration/timing/severity/associated sxs/prior Treatment) HPI   49yM with abdominal pain. Hx of pancreatitis and reports that these symptoms feel similar. Worsening this morning. Seen in ED 4/9 for the same but says felt much better on DC then. Declined script for pain meds. Did not try taking anything today. Nausea. No vomiting today. Pain is in epigastrium and radiates straight into back. No appreciable exacerbating or relieving factors. No fever or chills. No urinary complaints. Denies etoh use.    Past Medical History  Diagnosis Date  . Pancreatitis 12-29-2013  . GERD (gastroesophageal reflux disease)   . Arthritis     right elbow pain all the time, took cortisone shot for   Past Surgical History  Procedure Laterality Date  . No past surgeries    . Eus N/A 08/12/2014    Procedure: ESOPHAGEAL ENDOSCOPIC ULTRASOUND (EUS) RADIAL;  Surgeon: Willis Modena, MD;  Location: [facility];  Service: Endoscopy;  Laterality: N/A;  . Fine needle aspiration N/A 08/12/2014    Procedure: FINE NEEDLE ASPIRATION (FNA) LINEAR;  Surgeon: Willis Modena, MD;  Location: [facility];  Service: Endoscopy;  Laterality: N/A;   Family History  Problem Relation Age of Onset  . Other Mother     Healthy  . Other Father     Healthy   History  Substance Use Topics  . Smoking status: Current Every Day Smoker -- 1.00 packs/day for 25 years    Types: Cigarettes  . Smokeless tobacco: Never Used  . Alcohol Use: Yes     Comment: once a month.    Review of Systems  All systems reviewed and negative, other than as noted in HPI.   Allergies  Review of patient's allergies indicates no known allergies.  Home Medications   Prior to Admission medications    Medication Sig Start Date End Date Taking? Authorizing Provider  gemfibrozil (LOPID) 600 MG tablet Take 600 mg by mouth 2 (two) times daily before a meal.   Yes Historical Provider, MD  ibuprofen (ADVIL,MOTRIN) 200 MG tablet Take 400 mg by mouth every 6 (six) hours as needed for moderate pain.   Yes Historical Provider, MD  Omega-3 Fatty Acids (FISH OIL PO) Take 1 capsule by mouth 2 (two) times daily.   Yes Historical Provider, MD  omeprazole (PRILOSEC) 40 MG capsule Take 40 mg by mouth daily.    Yes Historical Provider, MD  promethazine (PHENERGAN) 25 MG tablet Take 25 mg by mouth every 6 (six) hours as needed for nausea or vomiting.   Yes Historical Provider, MD  oxyCODONE-acetaminophen (PERCOCET/ROXICET) 5-325 MG per tablet Take 2 tablets by mouth every 4 (four) hours as needed for severe pain. Patient not taking: Reported on 02/27/2015 12/25/14   Zadie Rhine, MD   BP 132/86 mmHg  Pulse 53  Temp(Src) 98 F (36.7 C) (Oral)  Resp 15  SpO2 100% Physical Exam  Constitutional: He appears well-developed and well-nourished. No distress.  HENT:  Head: Normocephalic and atraumatic.  Eyes: Conjunctivae are normal. Right eye exhibits no discharge. Left eye exhibits no discharge.  Neck: Neck supple.  Cardiovascular: Normal rate, regular rhythm and normal heart sounds.  Exam reveals no gallop and no friction rub.   No murmur heard. Pulmonary/Chest: Effort normal and  breath sounds normal. No respiratory distress.  Abdominal: Soft. He exhibits no distension. There is tenderness. There is no rebound and no guarding.  Epigastric and LUQ tenderness w/o rebound or guarding  Musculoskeletal: He exhibits no edema or tenderness.  Neurological: He is alert.  Skin: Skin is warm and dry.  Psychiatric: He has a normal mood and affect. His behavior is normal. Thought content normal.  Nursing note and vitals reviewed.   ED Course  Procedures (including critical care time) Labs Review Labs Reviewed   URINALYSIS, ROUTINE W REFLEX MICROSCOPIC - Abnormal; Notable for the following:    Color, Urine AMBER (*)    Bilirubin Urine SMALL (*)    Protein, ur 30 (*)    Leukocytes, UA SMALL (*)    All other components within normal limits  COMPREHENSIVE METABOLIC PANEL - Abnormal; Notable for the following:    Sodium 134 (*)    Alkaline Phosphatase 33 (*)    Total Bilirubin 5.7 (*)    All other components within normal limits  URINE MICROSCOPIC-ADD ON - Abnormal; Notable for the following:    Squamous Epithelial / LPF FEW (*)    Bacteria, UA FEW (*)    All other components within normal limits  LIPASE, BLOOD - Abnormal; Notable for the following:    Lipase 198 (*)    All other components within normal limits  CBC WITH DIFFERENTIAL/PLATELET  LIPASE, BLOOD  I-STAT CHEM 8, ED    Imaging Review No results found.   EKG Interpretation   Date/Time:  Tuesday March 02 2015 11:12:05 EDT Ventricular Rate:  62 PR Interval:  180 QRS Duration: 80 QT Interval:  453 QTC Calculation: 460 R Axis:   61 Text Interpretation:  Sinus rhythm No significant change since last  tracing Confirmed by Jamisen Hawes  MD, Devaris Quirk ([id number]) on 03/02/2015 11:19:17 AM      MDM   Final diagnoses:  Hypertriglyceridemia  Acute pancreatitis, unspecified pancreatitis type     49 year old male with epigastric pain. Patient has a past history of recurrent pancreatitis. Denies alcohol use. Previous US showing sludge, but suspect secondary to hypertriglyceridemia. Samples today very lipemic. T bili high but can be falsely elevated from interfering substances when lipemic.   Discussed with pt further. Reports aware of high triglycerides. "Mine is between 1500 and 2000." On gemfibrozil and fish oil. Reports compliance. Not diabetic. Will have try niacin. Instructed to try taking 500 mg daily until can follow-up with PCP. Diet discussed as well. Pain currently controlled. Previously declined prescription for pain medication on last  ED visit. Is agreeable now to use PRN. Reports has previously prescribed promethazine.   Raeford RazorStephen Chante Mayson, MD 03/05/15 [phone]

## 2015-11-27 ENCOUNTER — Emergency Department (HOSPITAL_COMMUNITY)
Admission: EM | Admit: 2015-11-27 | Discharge: 2015-11-27 | Disposition: A | Discharge status: Home / Self Care | Payer: Self-pay | Attending: Emergency Medicine | Admitting: Emergency Medicine

## 2015-11-27 ENCOUNTER — Encounter (HOSPITAL_COMMUNITY): Payer: Self-pay | Admitting: *Deleted

## 2015-11-27 DIAGNOSIS — R1012 Left upper quadrant pain: Secondary | ICD-10-CM | POA: Insufficient documentation

## 2015-11-27 DIAGNOSIS — F17210 Nicotine dependence, cigarettes, uncomplicated: Secondary | ICD-10-CM | POA: Insufficient documentation

## 2015-11-27 DIAGNOSIS — K858 Other acute pancreatitis without necrosis or infection: Secondary | ICD-10-CM | POA: Insufficient documentation

## 2015-11-27 DIAGNOSIS — Z79899 Other long term (current) drug therapy: Secondary | ICD-10-CM | POA: Insufficient documentation

## 2015-11-27 DIAGNOSIS — M1990 Unspecified osteoarthritis, unspecified site: Secondary | ICD-10-CM | POA: Insufficient documentation

## 2015-11-27 DIAGNOSIS — R112 Nausea with vomiting, unspecified: Secondary | ICD-10-CM | POA: Insufficient documentation

## 2015-11-27 LAB — CBC WITH DIFFERENTIAL/PLATELET
BASOS PCT: 0 %
Basophils Absolute: 0 10*3/uL (ref 0.0–0.1)
EOS ABS: 0.1 10*3/uL (ref 0.0–0.7)
EOS PCT: 1 %
HCT: 41.8 % (ref 39.0–52.0)
Hemoglobin: 15 g/dL (ref 13.0–17.0)
Lymphocytes Relative: 25 %
Lymphs Abs: 2.3 10*3/uL (ref 0.7–4.0)
MCH: 29.3 pg (ref 26.0–34.0)
MCHC: 35.9 g/dL (ref 30.0–36.0)
MCV: 81.6 fL (ref 78.0–100.0)
MONO ABS: 1.1 10*3/uL — AB (ref 0.1–1.0)
Monocytes Relative: 12 %
NEUTROS ABS: 5.6 10*3/uL (ref 1.7–7.7)
NEUTROS PCT: 62 %
PLATELETS: 264 10*3/uL (ref 150–400)
RBC: 5.05 MIL/uL (ref 4.22–5.81)
RDW: 14.2 % (ref 11.5–15.5)
WBC: 9.1 10*3/uL (ref 4.0–10.5)

## 2015-11-27 LAB — COMPREHENSIVE METABOLIC PANEL
ALBUMIN: 4.1 g/dL (ref 3.5–5.0)
ALK PHOS: 38 U/L (ref 38–126)
ALT: 47 U/L (ref 17–63)
AST: 80 U/L — AB (ref 15–41)
Anion gap: 9 (ref 5–15)
BUN: 12 mg/dL (ref 6–20)
CALCIUM: 9 mg/dL (ref 8.9–10.3)
CHLORIDE: 103 mmol/L (ref 101–111)
CO2: 24 mmol/L (ref 22–32)
CREATININE: 0.93 mg/dL (ref 0.61–1.24)
GFR calc Af Amer: >60 mL/min (ref >60)
GFR calc non Af Amer: >60 mL/min (ref >60)
GLUCOSE: 129 mg/dL — AB (ref 65–99)
Potassium: 5.2 mmol/L — ABNORMAL HIGH (ref 3.5–5.1)
SODIUM: 136 mmol/L (ref 135–145)
Total Bilirubin: 2.8 mg/dL — ABNORMAL HIGH (ref 0.3–1.2)

## 2015-11-27 LAB — URINALYSIS, ROUTINE W REFLEX MICROSCOPIC
Bilirubin Urine: NEGATIVE
GLUCOSE, UA: NEGATIVE mg/dL
KETONES UR: NEGATIVE mg/dL
LEUKOCYTES UA: NEGATIVE
Nitrite: NEGATIVE
PH: 5 (ref 5.0–8.0)
Protein, ur: NEGATIVE mg/dL
Specific Gravity, Urine: 1.026 (ref 1.005–1.030)

## 2015-11-27 LAB — URINE MICROSCOPIC-ADD ON: WBC, UA: NONE SEEN WBC/hpf (ref 0–5)

## 2015-11-27 LAB — LIPASE, BLOOD: LIPASE: 118 U/L — AB (ref 11–51)

## 2015-11-27 MED ORDER — SODIUM CHLORIDE 0.9 % IV BOLUS (SEPSIS)
500.0000 mL | Freq: Once | INTRAVENOUS | Status: AC
  Administered 2015-11-27: 500 mL via INTRAVENOUS

## 2015-11-27 MED ORDER — OXYCODONE-ACETAMINOPHEN 5-325 MG PO TABS: 1.0000 | ORAL_TABLET | Freq: Four times a day (QID) | ORAL | Status: DC | PRN

## 2015-11-27 MED ORDER — SODIUM CHLORIDE 0.9 % IV BOLUS (SEPSIS)
1000.0000 mL | Freq: Once | INTRAVENOUS | Status: AC
  Administered 2015-11-27: 1000 mL via INTRAVENOUS

## 2015-11-27 MED ORDER — ONDANSETRON HCL 4 MG PO TABS: 4.0000 mg | ORAL_TABLET | Freq: Four times a day (QID) | ORAL | Status: DC

## 2015-11-27 MED ORDER — HYDROMORPHONE HCL 1 MG/ML IJ SOLN
0.5000 mg | Freq: Once | INTRAMUSCULAR | Status: AC
  Administered 2015-11-27: 0.5 mg via INTRAVENOUS
  Filled 2015-11-27: qty 1

## 2015-11-27 MED ORDER — ONDANSETRON HCL 4 MG/2ML IJ SOLN
4.0000 mg | Freq: Once | INTRAMUSCULAR | Status: AC
  Administered 2015-11-27: 4 mg via INTRAVENOUS
  Filled 2015-11-27: qty 2

## 2015-11-27 MED ORDER — ONDANSETRON 4 MG PO TBDP
4.0000 mg | ORAL_TABLET | Freq: Once | ORAL | Status: AC | PRN
  Administered 2015-11-27: 4 mg via ORAL
  Filled 2015-11-27: qty 1

## 2015-11-27 NOTE — ED Notes (Signed)
This RN witnessed Grace BushyH. Morrison, RN, waste 0.5mg  Dilaudid at [phone] in sharps container.

## 2015-11-27 NOTE — Discharge Instructions (Signed)
Acute Pancreatitis Acute pancreatitis is a disease in which the pancreas becomes suddenly inflamed. The pancreas is a large gland located behind your stomach. The pancreas produces enzymes that help digest food. The pancreas also releases the hormones glucagon and insulin that help regulate blood sugar. Damage to the pancreas occurs when the digestive enzymes from the pancreas are activated and begin attacking the pancreas before being released into the intestine. Most acute attacks last a couple of days and can cause serious complications. Some people become dehydrated and develop low blood pressure. In severe cases, bleeding into the pancreas can lead to shock and can be life-threatening. The lungs, heart, and kidneys may fail. CAUSES  Pancreatitis can happen to anyone. In some cases, the cause is unknown. Most cases are caused by:  Alcohol abuse.  Gallstones. Other less common causes are:  Certain medicines.  Exposure to certain chemicals.  Infection.  Damage caused by an accident (trauma).  Abdominal surgery. SYMPTOMS   Pain in the upper abdomen that may radiate to the back.  Tenderness and swelling of the abdomen.  Nausea and vomiting. DIAGNOSIS  Your caregiver will perform a physical exam. Blood and stool tests may be done to confirm the diagnosis. Imaging tests may also be done, such as X-rays, CT scans, or an ultrasound of the abdomen. TREATMENT  Treatment usually requires a stay in the hospital. Treatment may include:  Pain medicine.  Fluid replacement through an intravenous line (IV).  Placing a tube in the stomach to remove stomach contents and control vomiting.  Not eating for 3 or 4 days. This gives your pancreas a rest, because enzymes are not being produced that can cause further damage.  Antibiotic medicines if your condition is caused by an infection.  Surgery of the pancreas or gallbladder. HOME CARE INSTRUCTIONS   Follow the diet advised by your  caregiver. This may involve avoiding alcohol and decreasing the amount of fat in your diet.  Eat smaller, more frequent meals. This reduces the amount of digestive juices the pancreas produces.  Drink enough fluids to keep your urine clear or pale yellow.  Only take over-the-counter or prescription medicines as directed by your caregiver.  Avoid drinking alcohol if it caused your condition.  Do not smoke.  Get plenty of rest.  Check your blood sugar at home as directed by your caregiver.  Keep all follow-up appointments as directed by your caregiver. SEEK MEDICAL CARE IF:   You do not recover as quickly as expected.  You develop new or worsening symptoms.  You have persistent pain, weakness, or nausea.  You recover and then have another episode of pain. SEEK IMMEDIATE MEDICAL CARE IF:   You are unable to eat or keep fluids down.  Your pain becomes severe.  You have a fever or persistent symptoms for more than 2 to 3 days.  You have a fever and your symptoms suddenly get worse.  Your skin or the white part of your eyes turn yellow (jaundice).  You develop vomiting.  You feel dizzy, or you faint.  Your blood sugar is high (over 300 mg/dL). MAKE SURE YOU:   Understand these instructions.  Will watch your condition.  Will get help right away if you are not doing well or get worse.   This information is not intended to replace advice given to you by your health care provider. Make sure you discuss any questions you have with your health care provider.   Document Released: 11/06/2005 Document Revised: 05/07/2012  Document Reviewed: 02/15/2012 Elsevier Interactive Patient Education 2016 [employer] Food Choices to Lower Your Triglycerides Triglycerides are a type of fat in your blood. High levels of triglycerides can increase the risk of heart disease and stroke. If your triglyceride levels are high, the foods you eat and your eating habits are very important.  Choosing the right foods can help lower your triglycerides.  WHAT GENERAL GUIDELINES DO I NEED TO FOLLOW?  Lose weight if you are overweight.   Limit or avoid alcohol.   Fill one half of your plate with vegetables and green salads.   Limit fruit to two servings a day. Choose fruit instead of juice.   Make one fourth of your plate whole grains. Look for the word "whole" as the first word in the ingredient list.  Fill one fourth of your plate with lean protein foods.  Enjoy fatty fish (such as salmon, mackerel, sardines, and tuna) three times a week.   Choose healthy fats.   Limit foods high in starch and sugar.  Eat more home-cooked food and less restaurant, buffet, and fast food.  Limit fried foods.  Cook foods using methods other than frying.  Limit saturated fats.  Check ingredient lists to avoid foods with partially hydrogenated oils (trans fats) in them. WHAT FOODS CAN I EAT?  Grains Whole grains, such as whole wheat or whole grain breads, crackers, cereals, and pasta. Unsweetened oatmeal, bulgur, barley, quinoa, or brown rice. Corn or whole wheat flour tortillas.  Vegetables Fresh or frozen vegetables (raw, steamed, roasted, or grilled). Green salads. Fruits All fresh, canned (in natural juice), or frozen fruits. Meat and Other Protein Products Ground beef (85% or leaner), grass-fed beef, or beef trimmed of fat. Skinless chicken or [place]. Ground chicken or [place]. Pork trimmed of fat. All fish and seafood. Eggs. Dried beans, peas, or lentils. Unsalted nuts or seeds. Unsalted canned or dry beans. Dairy Low-fat dairy products, such as skim or 1% milk, 2% or reduced-fat cheeses, low-fat ricotta or cottage cheese, or plain low-fat yogurt. Fats and Oils Tub margarines without trans fats. Light or reduced-fat mayonnaise and salad dressings. Avocado. Safflower, olive, or canola oils. Natural peanut or almond butter. The items listed above may not be a complete list of  recommended foods or beverages. Contact your dietitian for more options. WHAT FOODS ARE NOT RECOMMENDED?  Grains White bread. White pasta. White rice. Cornbread. Bagels, pastries, and croissants. Crackers that contain trans fat. Vegetables White potatoes. Corn. Creamed or fried vegetables. Vegetables in a cheese sauce. Fruits Dried fruits. Canned fruit in light or heavy syrup. Fruit juice. Meat and Other Protein Products Fatty cuts of meat. Ribs, chicken wings, bacon, sausage, bologna, salami, chitterlings, fatback, hot dogs, bratwurst, and packaged luncheon meats. Dairy Whole or 2% milk, cream, half-and-half, and cream cheese. Whole-fat or sweetened yogurt. Full-fat cheeses. Nondairy creamers and whipped toppings. Processed cheese, cheese spreads, or cheese curds. Sweets and Desserts Corn syrup, sugars, honey, and molasses. Candy. Jam and jelly. Syrup. Sweetened cereals. Cookies, pies, cakes, donuts, muffins, and ice cream. Fats and Oils Butter, stick margarine, lard, shortening, ghee, or bacon fat. Coconut, palm kernel, or palm oils. Beverages Alcohol. Sweetened drinks (such as sodas, lemonade, and fruit drinks or punches). The items listed above may not be a complete list of foods and beverages to avoid. Contact your dietitian for more information.   This information is not intended to replace advice given to you by your health care provider. Make sure you discuss any questions you  have with your health care provider.   Document Released: 08/24/2004 Document Revised: 11/27/2014 Document Reviewed: 09/10/2013 Elsevier Interactive Patient Education [employer]

## 2015-11-27 NOTE — ED Notes (Signed)
Pt. Has a history of pancreatitis and thinks his pancreas is flaring up. He wanted to be checked out before it got too bad

## 2015-11-27 NOTE — ED Notes (Signed)
Triage delayed due to nurse being involved with another critically ill patient. This pt understanding and in position of comfort. NAD noted.

## 2015-11-27 NOTE — ED Notes (Signed)
This RN wasted .5 mg dilaudid with Chrislyn K RN at [phone] in sharps container.

## 2015-11-27 NOTE — ED Provider Notes (Signed)
CSN: [id number]     Arrival date & time 11/27/15  0441 History   First MD Initiated Contact with Patient 11/27/15 [phone]     Chief Complaint  Patient presents with  . Abdominal Pain     (Consider location/radiation/quality/duration/timing/severity/associated sxs/prior Treatment) HPI   Patient is a 50 year old male who presents the emergency room with left upper quadrant and epigastric abdominal pain, patient states he believes that his pancreas" is flaring up again," consistent with his previous pancreatitis episodes. Pain is rated 7/10, with radiation to left side, pt unable to characterize pain, just states "its just hurts," and is worse with deep inspiration and palpation.  He has had 4 episodes of vomiting prior to arrival, described as brown, consistent with the hamberger her ate, he denies hematemesis, emesis non-bilious.  He denies ETOH use.  He has dx of hypertriglyceridemia, last admission in 2015.  Last CT April 2016.  Pt states he is a patient of Dr.  Kateri Plummer (family medicine), has not been to see the doctor most of the last year, states he's "been good."  When his pancreas "flares up" he claims "dilala and zofran" make him better and he usually can go home.  He also states that his doctors have not found anything to lower his triglycerides, and admits to eating mostly sugary or greasy foods.   Since his presentation to the ER, he has not had any further vomiting since receiving ODT zofran, he has drank a small amount of fluid, reports no current nausea.    Past Medical History  Diagnosis Date  . Pancreatitis 12-29-2013  . GERD (gastroesophageal reflux disease)   . Arthritis     right elbow pain all the time, took cortisone shot for   Past Surgical History  Procedure Laterality Date  . No past surgeries    . Eus N/A 08/12/2014    Procedure: ESOPHAGEAL ENDOSCOPIC ULTRASOUND (EUS) RADIAL;  Surgeon: Willis Modena, MD;  Location: [facility];  Service: Endoscopy;  Laterality: N/A;  .  Fine needle aspiration N/A 08/12/2014    Procedure: FINE NEEDLE ASPIRATION (FNA) LINEAR;  Surgeon: Willis Modena, MD;  Location: [facility];  Service: Endoscopy;  Laterality: N/A;   Family History  Problem Relation Age of Onset  . Other Mother     Healthy  . Other Father     Healthy   Social History  Substance Use Topics  . Smoking status: Current Every Day Smoker -- 1.00 packs/day for 25 years    Types: Cigarettes  . Smokeless tobacco: Never Used  . Alcohol Use: Yes     Comment: once a month.    Review of Systems  Constitutional: Negative.  Negative for fever, chills, diaphoresis, activity change and appetite change.  HENT: Negative.   Eyes: Negative.   Respiratory: Negative.  Negative for chest tightness and shortness of breath.   Cardiovascular: Negative.  Negative for chest pain, palpitations and leg swelling.  Gastrointestinal: Positive for nausea, vomiting and abdominal pain. Negative for diarrhea, constipation, abdominal distention and rectal pain.  Endocrine: Negative.   Genitourinary: Negative.  Negative for dysuria.  Musculoskeletal: Negative.   Skin: Negative.   Allergic/Immunologic: Negative.   Neurological: Negative.  Negative for syncope, weakness, light-headedness and headaches.  Psychiatric/Behavioral: Negative.       Allergies  Fish allergy  Home Medications   Prior to Admission medications   Medication Sig Start Date End Date Taking? Authorizing Provider  gemfibrozil (LOPID) 600 MG tablet Take 600 mg by mouth 2 (two) times  daily before a meal.    Historical Provider, MD  ibuprofen (ADVIL,MOTRIN) 200 MG tablet Take 400 mg by mouth every 6 (six) hours as needed for moderate pain.    Historical Provider, MD  niacin 500 MG CR capsule Take 1 capsule (500 mg total) by mouth at bedtime. Patient not taking: Reported on 11/27/2015 03/02/15   Raeford Razor, MD  Omega-3 Fatty Acids (FISH OIL PO) Take 1 capsule by mouth 2 (two) times daily.    Historical Provider,  MD  omeprazole (PRILOSEC) 40 MG capsule Take 40 mg by mouth daily.     Historical Provider, MD  ondansetron (ZOFRAN) 4 MG tablet Take 1 tablet (4 mg total) by mouth every 6 (six) hours. 11/27/15   Danelle Berry, PA-C  oxyCODONE-acetaminophen (PERCOCET/ROXICET) 5-325 MG tablet Take 1-2 tablets by mouth every 6 (six) hours as needed for severe pain. 11/27/15   Danelle Berry, PA-C  promethazine (PHENERGAN) 25 MG tablet Take 25 mg by mouth every 6 (six) hours as needed for nausea or vomiting.    Historical Provider, MD   BP 147/99 mmHg  Pulse 62  Temp(Src) 97.7 F (36.5 C) (Oral)  Resp 17  Ht 5\' 5"  (1.651 m)  Wt 83.462 kg  BMI 30.62 kg/m2  SpO2 97% Physical Exam  Constitutional: He is oriented to person, place, and time. He appears well-developed and well-nourished. No distress.  HENT:  Head: Normocephalic and atraumatic.  Nose: Nose normal.  Mouth/Throat: No oropharyngeal exudate.  Oral mucosa dry  Eyes: Conjunctivae and EOM are normal. Pupils are equal, round, and reactive to light. Right eye exhibits no discharge. Left eye exhibits no discharge. No scleral icterus.  Neck: Normal range of motion. No JVD present. No tracheal deviation present. No thyromegaly present.  Cardiovascular: Normal rate, regular rhythm, normal heart sounds and intact distal pulses.  Exam reveals no gallop and no friction rub.   No murmur heard. Pulmonary/Chest: Effort normal and breath sounds normal. No respiratory distress. He has no wheezes. He has no rales. He exhibits no tenderness.  Abdominal: Soft. Normal appearance and bowel sounds are normal. He exhibits no distension and no mass. There is tenderness. There is no rigidity, no rebound, no CVA tenderness, no tenderness at McBurney's point and negative Murphy's sign.  Mild guarding with palpation to LUQ and epigastrum  Musculoskeletal: Normal range of motion. He exhibits no edema or tenderness.  Lymphadenopathy:    He has no cervical adenopathy.  Neurological: He  is alert and oriented to person, place, and time. He has normal reflexes. No cranial nerve deficit. He exhibits normal muscle tone. Coordination normal.  Skin: Skin is warm and dry. No rash noted. He is not diaphoretic. No erythema. No pallor.  Psychiatric: He has a normal mood and affect. His behavior is normal. Judgment and thought content normal.  Nursing note and vitals reviewed.   ED Course  Procedures (including critical care time) Labs Review Labs Reviewed  COMPREHENSIVE METABOLIC PANEL - Abnormal; Notable for the following:    Potassium 5.2 (*)    Glucose, Bld 129 (*)    AST 80 (*)    Total Bilirubin 2.8 (*)    All other components within normal limits  CBC WITH DIFFERENTIAL/PLATELET - Abnormal; Notable for the following:    Monocytes Absolute 1.1 (*)    All other components within normal limits  LIPASE, BLOOD - Abnormal; Notable for the following:    Lipase 118 (*)    All other components within normal limits  URINALYSIS,  ROUTINE W REFLEX MICROSCOPIC (NOT AT [facility]) - Abnormal; Notable for the following:    Hgb urine dipstick MODERATE (*)    All other components within normal limits  URINE MICROSCOPIC-ADD ON - Abnormal; Notable for the following:    Squamous Epithelial / LPF 0-5 (*)    Bacteria, UA RARE (*)    All other components within normal limits    Imaging Review No results found. I have personally reviewed and evaluated these images and lab results as part of my medical decision-making.   EKG Interpretation   Date/Time:  Saturday November 27 2015 06:32:08 EST Ventricular Rate:  70 PR Interval:  171 QRS Duration: 100 QT Interval:  446 QTC Calculation: 481 R Axis:   70 Text Interpretation:  Sinus rhythm ST elevation, consider inferior injury  Borderline prolonged QT interval ED PHYSICIAN INTERPRETATION AVAILABLE IN  [facility] Confirmed by TEST, Record ([id number]) on 11/28/2015 10:20:01 AM      MDM   Pt with LUQ abdominal pain and vomiting x4, hx of  pancreatitis, states it is consistent with other flares. Pt appears rather comfortable at the time of my initial evaluation.  He is laughing and smiling while giving history, making jokes.  No active vomiting.  Pt appears slightly dry Will give IVF, treat pain, and give additional antiemetics then PO challenge.  Results pertinent for elevated lipase -118, mildly elevated AST and total bili.  Urine has moderate RBC - pt denies dysuria, hematuria, flank pain.  He declined any imagine to r/o kidney stone, and requests to go home.  States only difference with pain today is LUQ pain radiated to his left side, usually radiates to his back.    Pt's pain was managed, he had no active emesis, was discharged home is satisfactory and stable condition. Filed Vitals:   11/27/15 0615 11/27/15 0632 11/27/15 0700 11/27/15 0815  BP: 156/102 148/109 155/107 147/99  Pulse: 79 71 67 62  Temp:      TempSrc:      Resp: 19 13 16 17   Height:      Weight:      SpO2: 98% 97% 98% 97%      Final diagnoses:  LUQ abdominal pain  Other acute pancreatitis      Danelle BerryLeisa Ahmadou Bolz, PA-C 11/30/15 2308  Derwood KaplanAnkit Nanavati, MD 12/01/15 [id number]

## 2016-01-08 ENCOUNTER — Emergency Department (HOSPITAL_COMMUNITY)
Admission: EM | Admit: 2016-01-08 | Discharge: 2016-01-08 | Disposition: A | Discharge status: Home / Self Care | Payer: No Typology Code available for payment source | Attending: Emergency Medicine | Admitting: Emergency Medicine

## 2016-01-08 ENCOUNTER — Encounter (HOSPITAL_COMMUNITY): Payer: Self-pay | Admitting: *Deleted

## 2016-01-08 DIAGNOSIS — K859 Acute pancreatitis without necrosis or infection, unspecified: Secondary | ICD-10-CM | POA: Insufficient documentation

## 2016-01-08 DIAGNOSIS — F17210 Nicotine dependence, cigarettes, uncomplicated: Secondary | ICD-10-CM | POA: Insufficient documentation

## 2016-01-08 LAB — CBC
HCT: 42.9 % (ref 39.0–52.0)
Hemoglobin: 15.4 g/dL (ref 13.0–17.0)
MCH: 29.4 pg (ref 26.0–34.0)
MCHC: 35.9 g/dL (ref 30.0–36.0)
MCV: 82 fL (ref 78.0–100.0)
PLATELETS: 392 10*3/uL (ref 150–400)
RBC: 5.23 MIL/uL (ref 4.22–5.81)
RDW: 15.9 % — AB (ref 11.5–15.5)
WBC: 7.6 10*3/uL (ref 4.0–10.5)

## 2016-01-08 MED ORDER — ONDANSETRON 4 MG PO TBDP
4.0000 mg | ORAL_TABLET | Freq: Once | ORAL | Status: AC
  Administered 2016-01-08: 4 mg via ORAL

## 2016-01-08 MED ORDER — ONDANSETRON 4 MG PO TBDP
ORAL_TABLET | ORAL | Status: AC
  Filled 2016-01-08: qty 1

## 2016-01-08 MED ORDER — OXYCODONE-ACETAMINOPHEN 5-325 MG PO TABS
1.0000 | ORAL_TABLET | Freq: Once | ORAL | Status: AC
  Administered 2016-01-08: 1 via ORAL

## 2016-01-08 MED ORDER — OXYCODONE-ACETAMINOPHEN 5-325 MG PO TABS
ORAL_TABLET | ORAL | Status: AC
  Filled 2016-01-08: qty 1

## 2016-01-08 NOTE — ED Notes (Signed)
Patient presents with c/o abd pain due to his pancreatitis.  States he will have an attack and then none for a year or more.  Stated his doctor told him to come to the ED to get a shot when he has an attack.

## 2016-01-08 NOTE — ED Notes (Signed)
Pt left AMA °

## 2016-06-25 ENCOUNTER — Encounter (HOSPITAL_COMMUNITY): Payer: Self-pay | Admitting: Emergency Medicine

## 2016-06-25 ENCOUNTER — Emergency Department (HOSPITAL_COMMUNITY)
Admission: EM | Admit: 2016-06-25 | Discharge: 2016-06-25 | Disposition: A | Discharge status: Home / Self Care | Payer: Self-pay | Attending: Emergency Medicine | Admitting: Emergency Medicine

## 2016-06-25 DIAGNOSIS — F17210 Nicotine dependence, cigarettes, uncomplicated: Secondary | ICD-10-CM | POA: Insufficient documentation

## 2016-06-25 DIAGNOSIS — R1013 Epigastric pain: Secondary | ICD-10-CM

## 2016-06-25 DIAGNOSIS — K219 Gastro-esophageal reflux disease without esophagitis: Secondary | ICD-10-CM | POA: Insufficient documentation

## 2016-06-25 LAB — CBC
HEMATOCRIT: 45.3 % (ref 39.0–52.0)
Hemoglobin: 16.5 g/dL (ref 13.0–17.0)
MCH: 30.3 pg (ref 26.0–34.0)
MCHC: 36.4 g/dL — AB (ref 30.0–36.0)
MCV: 83.3 fL (ref 78.0–100.0)
Platelets: 365 10*3/uL (ref 150–400)
RBC: 5.44 MIL/uL (ref 4.22–5.81)
RDW: 15.3 % (ref 11.5–15.5)
WBC: 7.9 10*3/uL (ref 4.0–10.5)

## 2016-06-25 LAB — LIPASE, BLOOD: LIPASE: 65 U/L — AB (ref 11–51)

## 2016-06-25 LAB — COMPREHENSIVE METABOLIC PANEL
ALBUMIN: 3.7 g/dL (ref 3.5–5.0)
ALK PHOS: 34 U/L — AB (ref 38–126)
ANION GAP: 7 (ref 5–15)
BILIRUBIN TOTAL: 4.2 mg/dL — AB (ref 0.3–1.2)
BUN: 8 mg/dL (ref 6–20)
CALCIUM: 8.6 mg/dL — AB (ref 8.9–10.3)
CO2: 20 mmol/L — ABNORMAL LOW (ref 22–32)
Chloride: 107 mmol/L (ref 101–111)
Creatinine, Ser: 0.9 mg/dL (ref 0.61–1.24)
GFR calc non Af Amer: >60 mL/min (ref >60)
GLUCOSE: 133 mg/dL — AB (ref 65–99)
POTASSIUM: 7.2 mmol/L — AB (ref 3.5–5.1)
Sodium: 134 mmol/L — ABNORMAL LOW (ref 135–145)

## 2016-06-25 LAB — I-STAT TROPONIN, ED: TROPONIN I, POC: 0 ng/mL (ref 0.00–0.08)

## 2016-06-25 MED ORDER — GEMFIBROZIL 600 MG PO TABS: 600.0000 mg | ORAL_TABLET | Freq: Two times a day (BID) | ORAL | 0 refills | Status: DC

## 2016-06-25 MED ORDER — ONDANSETRON HCL 4 MG PO TABS: 4.0000 mg | ORAL_TABLET | Freq: Four times a day (QID) | ORAL | 0 refills | Status: DC

## 2016-06-25 MED ORDER — ONDANSETRON HCL 4 MG/2ML IJ SOLN
4.0000 mg | Freq: Once | INTRAMUSCULAR | Status: AC
  Administered 2016-06-25: 4 mg via INTRAVENOUS
  Filled 2016-06-25: qty 2

## 2016-06-25 MED ORDER — HYDROMORPHONE HCL 1 MG/ML IJ SOLN
1.0000 mg | Freq: Once | INTRAMUSCULAR | Status: AC
  Administered 2016-06-25: 1 mg via INTRAVENOUS
  Filled 2016-06-25: qty 1

## 2016-06-25 MED ORDER — SODIUM CHLORIDE 0.9 % IV BOLUS (SEPSIS)
1000.0000 mL | Freq: Once | INTRAVENOUS | Status: AC
  Administered 2016-06-25: 1000 mL via INTRAVENOUS

## 2016-06-25 MED ORDER — HYDROCODONE-ACETAMINOPHEN 5-325 MG PO TABS: 2.0000 | ORAL_TABLET | ORAL | 0 refills | Status: DC | PRN

## 2016-06-25 MED ORDER — PANTOPRAZOLE SODIUM 20 MG PO TBEC: 20.0000 mg | DELAYED_RELEASE_TABLET | Freq: Every day | ORAL | 0 refills | Status: DC

## 2016-06-25 MED ORDER — GI COCKTAIL ~~LOC~~
30.0000 mL | Freq: Once | ORAL | Status: AC
  Administered 2016-06-25: 30 mL via ORAL
  Filled 2016-06-25: qty 30

## 2016-06-25 NOTE — ED Triage Notes (Signed)
Pt c/o pancrease flare up onset couple hours ago. Pt c/o center abdominal pain with nausea. Pt also c/o left shoulder pain he raises it ongoing for months.

## 2016-06-25 NOTE — ED Provider Notes (Signed)
[facility]-EMERGENCY DEPT Provider Note   CSN: [id number] Arrival date & time: 06/25/16  1821  First Provider Contact:  First MD Initiated Contact with Patient 06/25/16 1835     History   Chief Complaint Chief Complaint  Patient presents with  . Abdominal Pain    HPI Jordan [name] is a 50 y.o. male.  The history is provided by the patient. No language interpreter was used.  Abdominal Pain   This is a new problem. The current episode started 6 to 12 hours ago. The problem occurs constantly. The problem has been gradually worsening. Associated with: History of idiopathic pancreatitis. The pain is located in the epigastric region. The pain is at a severity of 8/10. The pain is severe. Associated symptoms include nausea. Pertinent negatives include fever, diarrhea, vomiting, dysuria, hematuria and arthralgias. Nothing aggravates the symptoms. Nothing relieves the symptoms.    Past Medical History:  Diagnosis Date  . Arthritis    right elbow pain all the time, took cortisone shot for  . GERD (gastroesophageal reflux disease)   . Pancreatitis 12-29-2013    Patient Active Problem List   Diagnosis Date Noted  . Hypertriglyceridemia 12/31/2013  . Pancreatitis 12/29/2013  . Abdominal pain 12/29/2013  . Nausea and vomiting 12/29/2013  . Tobacco use 12/29/2013  . Leukocytosis 12/29/2013    Past Surgical History:  Procedure Laterality Date  . EUS N/A 08/12/2014   Procedure: ESOPHAGEAL ENDOSCOPIC ULTRASOUND (EUS) RADIAL;  Surgeon: Willis ModenaWilliam Outlaw, MD;  Location: [facility];  Service: Endoscopy;  Laterality: N/A;  . FINE NEEDLE ASPIRATION N/A 08/12/2014   Procedure: FINE NEEDLE ASPIRATION (FNA) LINEAR;  Surgeon: Willis ModenaWilliam Outlaw, MD;  Location: [facility];  Service: Endoscopy;  Laterality: N/A;  . NO PAST SURGERIES         Home Medications    Prior to Admission medications   Medication Sig Start Date End Date Taking? Authorizing Provider  gemfibrozil (LOPID) 600 MG tablet Take 1 tablet  (600 mg total) by mouth 2 (two) times daily before a meal. 06/25/16 07/25/16  Dan HumphreysMichael Deakon Frix, MD  HYDROcodone-acetaminophen (NORCO/VICODIN) 5-325 MG tablet Take 2 tablets by mouth every 4 (four) hours as needed. 06/25/16   Dan HumphreysMichael Jamyrah Saur, MD  ibuprofen (ADVIL,MOTRIN) 200 MG tablet Take 400 mg by mouth every 6 (six) hours as needed for moderate pain.    Historical Provider, MD  niacin 500 MG CR capsule Take 1 capsule (500 mg total) by mouth at bedtime. Patient not taking: Reported on 11/27/2015 03/02/15   Raeford RazorStephen Kohut, MD  Omega-3 Fatty Acids (FISH OIL PO) Take 1 capsule by mouth 2 (two) times daily.    Historical Provider, MD  omeprazole (PRILOSEC) 40 MG capsule Take 40 mg by mouth daily.     Historical Provider, MD  ondansetron (ZOFRAN) 4 MG tablet Take 1 tablet (4 mg total) by mouth every 6 (six) hours. 06/25/16   Dan HumphreysMichael Camelle Henkels, MD  oxyCODONE-acetaminophen (PERCOCET/ROXICET) 5-325 MG tablet Take 1-2 tablets by mouth every 6 (six) hours as needed for severe pain. 11/27/15   Danelle BerryLeisa Tapia, PA-C  pantoprazole (PROTONIX) 20 MG tablet Take 1 tablet (20 mg total) by mouth daily. 06/25/16 07/25/16  Dan HumphreysMichael Jeffey Janssen, MD  promethazine (PHENERGAN) 25 MG tablet Take 25 mg by mouth every 6 (six) hours as needed for nausea or vomiting.    Historical Provider, MD    Family History Family History  Problem Relation Age of Onset  . Other Mother     Healthy  . Other Father     Healthy  Social History Social History  Substance Use Topics  . Smoking status: Current Every Day Smoker    Packs/day: 1.00    Years: 25.00    Types: Cigarettes  . Smokeless tobacco: Never Used  . Alcohol use Yes     Comment: last use 05/21/2016     Allergies   Fish allergy   Review of Systems Review of Systems  Constitutional: Negative for chills and fever.  HENT: Negative for ear pain and sore throat.   Eyes: Negative for pain and visual disturbance.  Respiratory: Negative for cough and shortness of breath.   Cardiovascular: Negative  for chest pain and palpitations.  Gastrointestinal: Positive for abdominal pain and nausea. Negative for diarrhea and vomiting.  Genitourinary: Negative for dysuria and hematuria.  Musculoskeletal: Negative for arthralgias and back pain.  Skin: Negative for color change and rash.  Neurological: Negative for seizures and syncope.  All other systems reviewed and are negative.    Physical Exam Updated Vital Signs BP (!) 173/110   Pulse (!) 59   Temp 98.3 F (36.8 C) (Oral)   Resp 16   Ht  (1.651 m)   Wt 87.8 kg   SpO2 93%   BMI 32.20 kg/m   Physical Exam  Constitutional: He appears well-developed and well-nourished.  HENT:  Head: Normocephalic and atraumatic.  Eyes: Conjunctivae are normal.  Neck: Neck supple.  Cardiovascular: Normal rate and regular rhythm.   No murmur heard. Pulmonary/Chest: Effort normal and breath sounds normal. No respiratory distress.  Abdominal: Soft. There is tenderness in the epigastric area. There is no rigidity, no rebound, no guarding, no CVA tenderness and negative Murphy's sign.  Musculoskeletal: He exhibits no edema.  Neurological: He is alert.  Skin: Skin is warm and dry.  Psychiatric: He has a normal mood and affect.  Nursing note and vitals reviewed.    ED Treatments / Results  Labs (all labs ordered are listed, but only abnormal results are displayed) Labs Reviewed  CBC - Abnormal; Notable for the following:       Result Value   MCHC 36.4 (*)    All other components within normal limits  COMPREHENSIVE METABOLIC PANEL - Abnormal; Notable for the following:    Sodium 134 (*)    Potassium 7.2 (*)    CO2 20 (*)    Glucose, Bld 133 (*)    Calcium 8.6 (*)    Alkaline Phosphatase 34 (*)    Total Bilirubin 4.2 (*)    All other components within normal limits  LIPASE, BLOOD - Abnormal; Notable for the following:    Lipase 65 (*)    All other components within normal limits  URINALYSIS, ROUTINE W REFLEX MICROSCOPIC (NOT AT [facility])   I-STAT TROPOININ, ED  I-STAT TROPOININ, ED    EKG  EKG Interpretation  Date/Time:  Sunday June 25 2016 19:58:41 EDT Ventricular Rate:  62 PR Interval:    QRS Duration: 89 QT Interval:  466 QTC Calculation: 474 R Axis:   59 Text Interpretation:  Sinus rhythm Borderline T wave abnormalities When compared with ECG of 11/27/2015, Nonspecific T wave abnormality is now Present Confirmed by [facility]  MD, DAVID ([id number]) on 06/25/2016 8:07:38 PM       Radiology No results found.  Procedures Procedures (including critical care time)  Medications Ordered in ED Medications  sodium chloride 0.9 % bolus 1,000 mL (0 mLs Intravenous Stopped 06/25/16 2056)  gi cocktail (Maalox,Lidocaine,Donnatal) (30 mLs Oral Given 06/25/16 1944)  HYDROmorphone (DILAUDID) injection  1 mg (1 mg Intravenous Given 06/25/16 1945)  ondansetron (ZOFRAN) injection 4 mg (4 mg Intravenous Given 06/25/16 1945)     Initial Impression / Assessment and Plan / ED Course  I have reviewed the triage vital signs and the nursing notes.  Pertinent labs & imaging results that were available during my care of the patient were reviewed by me and considered in my medical decision making (see chart for details).  Clinical Course    Patient with history of recurrent idiopathic pancreatitis presents for evaluation of 6 hours of epigastric pain, burning, nausea. He took oxycodone at home without relief of pain. He states that his doctor told him that every time he has his pain he should go to the emergency department for "a shot".  Vital signs with mild hypertension, no fever. Patient overall well appearing no acute distress. He has mild tenderness to palpation epigastric area without rigidity, rebound, guarding.  Differential diagnoses includes GERD versus pancreatitis versus hepatitis versus ACS. Story more consistent with abdominal findings.  Will obtain screening EKG, troponin 1, CMP, lipase, CBC.  We'll treat patient's pain with  Dilaudid, Zofran for nausea, normal saline bolus. Will treat with GI cocktail as well and reevaluate for symptomatic relief.  9:20 PM: Patient reevaluated. Feels much better after pain medications, able to tolerate oral intake. EKG normal sinus rhythm, no ischemic changes. No peaked T waves. CMP resulted with hemolysis, potassium 7.2. Renal function okay, no peaked T waves. Patient on no potassium supplementation, Ace/arbs. Presumed and likely to be artifactual due to hemolysis. Patient with large amount of lipids per laboratory.  Suspect pancreatitis due to hyper triglyceridemia. I refilled patient's gemfibrozil as he has not been taking that for the past several months. I discharge him with pain medication, nausea medication. Encourage PCP follow-up which patient states he will be able to do this coming week. Recommended ongoing treatment for GERD at home.  Discussed case with my attending, Dr. Preston Fleeting.  Final Clinical Impressions(s) / ED Diagnoses   Final diagnoses:  Epigastric pain  Gastroesophageal reflux disease without esophagitis    New Prescriptions New Prescriptions   HYDROCODONE-ACETAMINOPHEN (NORCO/VICODIN) 5-325 MG TABLET    Take 2 tablets by mouth every 4 (four) hours as needed.   ONDANSETRON (ZOFRAN) 4 MG TABLET    Take 1 tablet (4 mg total) by mouth every 6 (six) hours.   PANTOPRAZOLE (PROTONIX) 20 MG TABLET    Take 1 tablet (20 mg total) by mouth daily.     Dan Humphreys, MD 06/25/16 2157    Dione Booze, MD 06/25/16 [phone]

## 2016-06-25 NOTE — ED Notes (Signed)
Pt unalbe to void at this time 

## 2016-06-25 NOTE — ED Notes (Addendum)
Pt also c/o left shoulder pain-- " I need a cortisone in my shoulder I think " cannot lift arm over head- throbs like a toothache,

## 2016-06-26 LAB — I-STAT TROPONIN, ED: TROPONIN I, POC: 0 ng/mL (ref 0.00–0.08)

## 2016-07-16 ENCOUNTER — Emergency Department (HOSPITAL_COMMUNITY)
Admission: EM | Admit: 2016-07-16 | Discharge: 2016-07-16 | Disposition: A | Discharge status: Home / Self Care | Payer: Self-pay | Attending: Emergency Medicine | Admitting: Emergency Medicine

## 2016-07-16 ENCOUNTER — Emergency Department (HOSPITAL_COMMUNITY): Payer: Self-pay

## 2016-07-16 ENCOUNTER — Encounter (HOSPITAL_COMMUNITY): Payer: Self-pay | Admitting: Emergency Medicine

## 2016-07-16 DIAGNOSIS — R0789 Other chest pain: Secondary | ICD-10-CM

## 2016-07-16 DIAGNOSIS — R7989 Other specified abnormal findings of blood chemistry: Secondary | ICD-10-CM

## 2016-07-16 DIAGNOSIS — R945 Abnormal results of liver function studies: Secondary | ICD-10-CM | POA: Insufficient documentation

## 2016-07-16 DIAGNOSIS — F17210 Nicotine dependence, cigarettes, uncomplicated: Secondary | ICD-10-CM | POA: Insufficient documentation

## 2016-07-16 DIAGNOSIS — K219 Gastro-esophageal reflux disease without esophagitis: Secondary | ICD-10-CM | POA: Insufficient documentation

## 2016-07-16 LAB — COMPREHENSIVE METABOLIC PANEL
ALBUMIN: 4.6 g/dL (ref 3.5–5.0)
ALT: 29 U/L (ref 17–63)
ANION GAP: 9 (ref 5–15)
AST: 71 U/L — ABNORMAL HIGH (ref 15–41)
Alkaline Phosphatase: 34 U/L — ABNORMAL LOW (ref 38–126)
BUN: 7 mg/dL (ref 6–20)
CHLORIDE: 102 mmol/L (ref 101–111)
CO2: 20 mmol/L — AB (ref 22–32)
Calcium: 9.4 mg/dL (ref 8.9–10.3)
Creatinine, Ser: 0.65 mg/dL (ref 0.61–1.24)
GFR calc Af Amer: >60 mL/min (ref >60)
GFR calc non Af Amer: >60 mL/min (ref >60)
GLUCOSE: 116 mg/dL — AB (ref 65–99)
POTASSIUM: 5.2 mmol/L — AB (ref 3.5–5.1)
SODIUM: 131 mmol/L — AB (ref 135–145)
Total Bilirubin: 2.8 mg/dL — ABNORMAL HIGH (ref 0.3–1.2)
Total Protein: 8.1 g/dL (ref 6.5–8.1)

## 2016-07-16 LAB — URINALYSIS, ROUTINE W REFLEX MICROSCOPIC
BILIRUBIN URINE: NEGATIVE
Glucose, UA: NEGATIVE mg/dL
Ketones, ur: NEGATIVE mg/dL
NITRITE: NEGATIVE
PH: 5.5 (ref 5.0–8.0)
Protein, ur: NEGATIVE mg/dL
SPECIFIC GRAVITY, URINE: 1.019 (ref 1.005–1.030)

## 2016-07-16 LAB — LIPASE, BLOOD: Lipase: 24 U/L (ref 11–51)

## 2016-07-16 LAB — URINE MICROSCOPIC-ADD ON: RBC / HPF: NONE SEEN RBC/hpf (ref 0–5)

## 2016-07-16 LAB — CBC
HEMATOCRIT: 45.1 % (ref 39.0–52.0)
HEMOGLOBIN: 15.9 g/dL (ref 13.0–17.0)
MCH: 28.2 pg (ref 26.0–34.0)
MCHC: 35.3 g/dL (ref 30.0–36.0)
MCV: 80 fL (ref 78.0–100.0)
Platelets: 310 10*3/uL (ref 150–400)
RBC: 5.64 MIL/uL (ref 4.22–5.81)
RDW: 13.9 % (ref 11.5–15.5)
WBC: 8.6 10*3/uL (ref 4.0–10.5)

## 2016-07-16 LAB — I-STAT TROPONIN, ED: Troponin i, poc: 0 ng/mL (ref 0.00–0.08)

## 2016-07-16 MED ORDER — SODIUM CHLORIDE 0.9 % IV BOLUS (SEPSIS)
500.0000 mL | Freq: Once | INTRAVENOUS | Status: AC
  Administered 2016-07-16: 500 mL via INTRAVENOUS

## 2016-07-16 MED ORDER — FENTANYL CITRATE (PF) 100 MCG/2ML IJ SOLN
25.0000 ug | Freq: Once | INTRAMUSCULAR | Status: AC
  Administered 2016-07-16: 25 ug via INTRAVENOUS
  Filled 2016-07-16: qty 2

## 2016-07-16 MED ORDER — ONDANSETRON HCL 4 MG/2ML IJ SOLN
4.0000 mg | Freq: Once | INTRAMUSCULAR | Status: AC
  Administered 2016-07-16: 4 mg via INTRAVENOUS
  Filled 2016-07-16: qty 2

## 2016-07-16 MED ORDER — HYDROMORPHONE HCL 1 MG/ML IJ SOLN
1.0000 mg | Freq: Once | INTRAMUSCULAR | Status: AC
  Administered 2016-07-16: 1 mg via INTRAVENOUS
  Filled 2016-07-16: qty 1

## 2016-07-16 MED ORDER — OXYCODONE HCL 10 MG PO TABS: 10.0000 mg | ORAL_TABLET | Freq: Three times a day (TID) | ORAL | 0 refills | Status: DC | PRN

## 2016-07-16 NOTE — ED Triage Notes (Signed)
Pt. Stated, I have pancreatitis, and I just had it about 2 weeks ago.  That started last night with vomiting and today Im having chest pain with SOB and vomiting.

## 2016-07-16 NOTE — ED Provider Notes (Addendum)
[facility]-EMER[facility]ENCY DEPT Provider Note   CSN: [id number] Arrival date & time: 07/16/16  1010     History   Chief Complaint Chief Complaint  Patient presents with  . Chest Pain  . Abdominal Pain  . Emesis    HPI Jordan [name] is a 50 y.o. male.  HPI Pt. Stated, I have pancreatitis, and I just had it about 2 weeks ago. That started last night with vomiting and today Im having chest pain with SOB and vomiting.  Past Medical History:  Diagnosis Date  . Arthritis    right elbow pain all the time, took cortisone shot for  . GERD (gastroesophageal reflux disease)   . Pancreatitis 12-29-2013    Patient Active Problem List   Diagnosis Date Noted  . Hypertriglyceridemia 12/31/2013  . Pancreatitis 12/29/2013  . Abdominal pain 12/29/2013  . Nausea and vomiting 12/29/2013  . Tobacco use 12/29/2013  . Leukocytosis 12/29/2013    Past Surgical History:  Procedure Laterality Date  . EUS N/A 08/12/2014   Procedure: ESOPHAGEAL ENDOSCOPIC ULTRASOUND (EUS) RADIAL;  Surgeon: Willis ModenaWilliam Outlaw, MD;  Location: [facility];  Service: Endoscopy;  Laterality: N/A;  . FINE NEEDLE ASPIRATION N/A 08/12/2014   Procedure: FINE NEEDLE ASPIRATION (FNA) LINEAR;  Surgeon: Willis ModenaWilliam Outlaw, MD;  Location: [facility];  Service: Endoscopy;  Laterality: N/A;  . NO PAST SURGERIES         Home Medications    Prior to Admission medications   Medication Sig Start Date End Date Taking? Authorizing Provider  HYDROcodone-acetaminophen (NORCO/VICODIN) 5-325 MG tablet Take 2 tablets by mouth every 4 (four) hours as needed. Patient taking differently: Take 1-2 tablets by mouth every 4 (four) hours as needed for moderate pain.  06/25/16  Yes Dan HumphreysMichael Irick, MD  ibuprofen (ADVIL,MOTRIN) 200 MG tablet Take 400 mg by mouth every 6 (six) hours as needed for moderate pain.   Yes Historical Provider, MD  Omega-3 Fatty Acids (FISH OIL PO) Take 1 capsule by mouth daily.    Yes Historical Provider, MD  OVER THE COUNTER MEDICATION  Take 1 tablet by mouth daily. [employer] Anti Acid   Yes Historical Provider, MD  promethazine (PHENERGAN) 25 MG tablet Take 25 mg by mouth every 6 (six) hours as needed for nausea or vomiting.   Yes Historical Provider, MD  niacin 500 MG CR capsule Take 1 capsule (500 mg total) by mouth at bedtime. Patient not taking: Reported on 11/27/2015 03/02/15   Raeford RazorStephen Kohut, MD  ondansetron (ZOFRAN) 4 MG tablet Take 1 tablet (4 mg total) by mouth every 6 (six) hours. Patient not taking: Reported on 07/16/2016 06/25/16   Dan HumphreysMichael Irick, MD  Oxycodone HCl 10 MG TABS Take 1 tablet (10 mg total) by mouth 3 (three) times daily as needed. 07/16/16   Nelva Nayobert Viraaj Vorndran, MD  oxyCODONE-acetaminophen (PERCOCET/ROXICET) 5-325 MG tablet Take 1-2 tablets by mouth every 6 (six) hours as needed for severe pain. Patient not taking: Reported on 07/16/2016 11/27/15   Danelle BerryLeisa Tapia, PA-C  pantoprazole (PROTONIX) 20 MG tablet Take 1 tablet (20 mg total) by mouth daily. Patient not taking: Reported on 07/16/2016 06/25/16 07/25/16  Dan HumphreysMichael Irick, MD    Family History Family History  Problem Relation Age of Onset  . Other Mother     Healthy  . Other Father     Healthy    Social History Social History  Substance Use Topics  . Smoking status: Current Every Day Smoker    Packs/day: 1.00    Years: 25.00  Types: Cigarettes  . Smokeless tobacco: Never Used  . Alcohol use Yes     Comment: last use 05/21/2016     Allergies   Fish allergy   Review of Systems Review of Systems  Constitutional: Negative for chills and fever.  All other systems reviewed and are negative.    Physical Exam Updated Vital Signs BP 149/96 (BP Location: Left Arm)   Pulse (!) 57   Temp 98 F (36.7 C) (Oral)   Resp 17   Ht 5\' 5"  (1.651 m)   Wt 192 lb (87.1 kg)   SpO2 96%   BMI 31.95 kg/m   Physical Exam  Constitutional: He is oriented to person, place, and time. He appears well-developed and well-nourished. No distress.  HENT:  Head:  Normocephalic and atraumatic.  Eyes: Pupils are equal, round, and reactive to light.  Neck: Normal range of motion.  Cardiovascular: Normal rate and intact distal pulses.   Pulmonary/Chest: No respiratory distress.  Abdominal: Soft. Normal appearance and bowel sounds are normal. He exhibits no distension.  Musculoskeletal: Normal range of motion.  Neurological: He is alert and oriented to person, place, and time. No cranial nerve deficit.  Skin: Skin is warm and dry. No rash noted.  Psychiatric: He has a normal mood and affect. His behavior is normal.  Nursing note and vitals reviewed.    ED Treatments / Results  Labs (all labs ordered are listed, but only abnormal results are displayed) Labs Reviewed  COMPREHENSIVE METABOLIC PANEL - Abnormal; Notable for the following:       Result Value   Sodium 131 (*)    Potassium 5.2 (*)    CO2 20 (*)    Glucose, Bld 116 (*)    AST 71 (*)    Alkaline Phosphatase 34 (*)    Total Bilirubin 2.8 (*)    All other components within normal limits  URINALYSIS, ROUTINE W REFLEX MICROSCOPIC (NOT AT [facility]) - Abnormal; Notable for the following:    APPearance CLOUDY (*)    Hgb urine dipstick SMALL (*)    Leukocytes, UA SMALL (*)    All other components within normal limits  URINE MICROSCOPIC-ADD ON - Abnormal; Notable for the following:    Squamous Epithelial / LPF 0-5 (*)    Bacteria, UA RARE (*)    All other components within normal limits  LIPASE, BLOOD  CBC  I-STAT TROPOININ, ED    EKG  EKG Interpretation  Date/Time:  Sunday July 16 2016 10:17:21 EDT Ventricular Rate:  67 PR Interval:  166 QRS Duration: 88 QT Interval:  448 QTC Calculation: 473 R Axis:   67 Text Interpretation:  Normal sinus rhythm Minimal voltage criteria for LVH, may be normal variant Borderline ECG Abnormal ekg Confirmed by Radford Pax  MD, Briseis Aguilera ([id number]) on 07/16/2016 11:11:19 AM       Radiology Ct Renal Stone Study  Result Date: 07/16/2016 CLINICAL DATA:   Epigastric pain beginning last night. Nausea, vomiting. EXAM: CT ABDOMEN AND PELVIS WITHOUT CONTRAST TECHNIQUE: Multidetector CT imaging of the abdomen and pelvis was performed following the standard protocol without IV contrast. COMPARISON:  03/02/2015 FINDINGS: Lower chest: Dependent atelectasis or scarring in the lung bases. No effusions. Heart is upper limits normal in size. Hepatobiliary: Diffuse fatty infiltration of the liver. No focal abnormality or biliary ductal dilatation. Gallbladder unremarkable. Pancreas: No focal abnormality or ductal dilatation. Spleen: No focal abnormality.  Normal size. Adrenals/Urinary Tract: No adrenal abnormality. No focal renal abnormality. No stones or hydronephrosis. Urinary  bladder is unremarkable. Stomach/Bowel: Normal appendix. Stomach and small bowel unremarkable. Descending colonic and sigmoid diverticulosis. No active diverticulitis. Vascular/Lymphatic: Aortic and iliac calcifications.  No aneurysm. Reproductive: No visible focal abnormality. Other: No free fluid or free air. Small bilateral inguinal hernias containing fat Musculoskeletal: No acute bony abnormality or focal bone lesion. IMPRESSION: Fatty infiltration of the liver. Left colonic diverticulosis.  No active diverticulitis. No acute findings. Electronically Signed   By: Charlett Nose M.D.   On: 07/16/2016 13:49    Procedures Procedures (including critical care time)  Medications Ordered in ED Medications  sodium chloride 0.9 % bolus 500 mL (0 mLs Intravenous Stopped 07/16/16 1200)  fentaNYL (SUBLIMAZE) injection 25 mcg (25 mcg Intravenous Given 07/16/16 1129)  ondansetron (ZOFRAN) injection 4 mg (4 mg Intravenous Given 07/16/16 1130)  HYDROmorphone (DILAUDID) injection 1 mg (1 mg Intravenous Given 07/16/16 1245)     Initial Impression / Assessment and Plan / ED Course  I have reviewed the triage vital signs and the nursing notes.  Pertinent labs & imaging results that were available during my  care of the patient were reviewed by me and considered in my medical decision making (see chart for details).  Clinical Course      Final Clinical Impressions(s) / ED Diagnoses   Final diagnoses:  Atypical chest pain  LFTs abnormal  Gastroesophageal reflux disease, esophagitis presence not specified    New Prescriptions New Prescriptions   OXYCODONE HCL 10 MG TABS    Take 1 tablet (10 mg total) by mouth 3 (three) times daily as needed.     Nelva Nay, MD 07/16/16 1404    Nelva Nay, MD 11/01/16 [phone]

## 2016-08-14 ENCOUNTER — Encounter (HOSPITAL_COMMUNITY): Payer: Self-pay | Admitting: Emergency Medicine

## 2016-08-14 DIAGNOSIS — E871 Hypo-osmolality and hyponatremia: Secondary | ICD-10-CM | POA: Insufficient documentation

## 2016-08-14 DIAGNOSIS — K858 Other acute pancreatitis without necrosis or infection: Secondary | ICD-10-CM | POA: Insufficient documentation

## 2016-08-14 DIAGNOSIS — Z79899 Other long term (current) drug therapy: Secondary | ICD-10-CM | POA: Insufficient documentation

## 2016-08-14 DIAGNOSIS — F17210 Nicotine dependence, cigarettes, uncomplicated: Secondary | ICD-10-CM | POA: Insufficient documentation

## 2016-08-14 MED ORDER — ONDANSETRON 4 MG PO TBDP
ORAL_TABLET | ORAL | Status: AC
  Filled 2016-08-14: qty 2

## 2016-08-14 MED ORDER — ONDANSETRON 4 MG PO TBDP
8.0000 mg | ORAL_TABLET | Freq: Once | ORAL | Status: AC
  Administered 2016-08-15: 8 mg via ORAL

## 2016-08-14 NOTE — ED Triage Notes (Signed)
Pt. reports central chest and epigastric pain with SOB , emesis and diaphoresis onset today .

## 2016-08-15 ENCOUNTER — Encounter (HOSPITAL_COMMUNITY): Payer: Self-pay

## 2016-08-15 ENCOUNTER — Emergency Department (HOSPITAL_COMMUNITY)
Admission: EM | Admit: 2016-08-15 | Discharge: 2016-08-15 | Disposition: A | Discharge status: Home / Self Care | Payer: Self-pay | Attending: Emergency Medicine | Admitting: Emergency Medicine

## 2016-08-15 ENCOUNTER — Inpatient Hospital Stay (HOSPITAL_COMMUNITY)
Admission: EM | Admit: 2016-08-15 | Discharge: 2016-08-17 | DRG: 439 | Disposition: A | Discharge status: Home / Self Care | Payer: Self-pay | Attending: Internal Medicine | Admitting: Internal Medicine

## 2016-08-15 ENCOUNTER — Emergency Department (HOSPITAL_COMMUNITY): Payer: Self-pay

## 2016-08-15 DIAGNOSIS — K858 Other acute pancreatitis without necrosis or infection: Secondary | ICD-10-CM | POA: Diagnosis present

## 2016-08-15 DIAGNOSIS — R112 Nausea with vomiting, unspecified: Secondary | ICD-10-CM

## 2016-08-15 DIAGNOSIS — R030 Elevated blood-pressure reading, without diagnosis of hypertension: Secondary | ICD-10-CM

## 2016-08-15 DIAGNOSIS — K859 Acute pancreatitis without necrosis or infection, unspecified: Principal | ICD-10-CM | POA: Diagnosis present

## 2016-08-15 DIAGNOSIS — F17210 Nicotine dependence, cigarettes, uncomplicated: Secondary | ICD-10-CM | POA: Diagnosis present

## 2016-08-15 DIAGNOSIS — E781 Pure hyperglyceridemia: Secondary | ICD-10-CM | POA: Diagnosis present

## 2016-08-15 DIAGNOSIS — Z720 Tobacco use: Secondary | ICD-10-CM | POA: Diagnosis present

## 2016-08-15 DIAGNOSIS — I4581 Long QT syndrome: Secondary | ICD-10-CM | POA: Diagnosis present

## 2016-08-15 DIAGNOSIS — Z23 Encounter for immunization: Secondary | ICD-10-CM

## 2016-08-15 DIAGNOSIS — E871 Hypo-osmolality and hyponatremia: Secondary | ICD-10-CM

## 2016-08-15 DIAGNOSIS — I10 Essential (primary) hypertension: Secondary | ICD-10-CM | POA: Diagnosis present

## 2016-08-15 DIAGNOSIS — M6282 Rhabdomyolysis: Secondary | ICD-10-CM | POA: Diagnosis present

## 2016-08-15 LAB — COMPREHENSIVE METABOLIC PANEL
ALK PHOS: 41 U/L (ref 38–126)
ALT: 46 U/L (ref 17–63)
AST: 68 U/L — ABNORMAL HIGH (ref 15–41)
Albumin: 4.4 g/dL (ref 3.5–5.0)
Anion gap: 10 (ref 5–15)
BILIRUBIN TOTAL: 2.4 mg/dL — AB (ref 0.3–1.2)
BUN: 8 mg/dL (ref 6–20)
CHLORIDE: 95 mmol/L — AB (ref 101–111)
CO2: 24 mmol/L (ref 22–32)
CREATININE: 1.07 mg/dL (ref 0.61–1.24)
Calcium: 9.2 mg/dL (ref 8.9–10.3)
GFR calc Af Amer: >60 mL/min (ref >60)
GFR calc non Af Amer: >60 mL/min (ref >60)
GLUCOSE: 121 mg/dL — AB (ref 65–99)
POTASSIUM: 5.2 mmol/L — AB (ref 3.5–5.1)
Sodium: 129 mmol/L — ABNORMAL LOW (ref 135–145)
Total Protein: 7.6 g/dL (ref 6.5–8.1)

## 2016-08-15 LAB — CBC WITH DIFFERENTIAL/PLATELET
BASOS PCT: 0 %
Basophils Absolute: 0 10*3/uL (ref 0.0–0.1)
EOS PCT: 1 %
Eosinophils Absolute: 0.1 10*3/uL (ref 0.0–0.7)
HCT: 43.9 % (ref 39.0–52.0)
HEMOGLOBIN: 16.1 g/dL (ref 13.0–17.0)
LYMPHS ABS: 2.3 10*3/uL (ref 0.7–4.0)
Lymphocytes Relative: 19 %
MCH: 28 pg (ref 26.0–34.0)
MCHC: 36.7 g/dL — AB (ref 30.0–36.0)
MCV: 76.2 fL — ABNORMAL LOW (ref 78.0–100.0)
MONO ABS: 0.9 10*3/uL (ref 0.1–1.0)
MONOS PCT: 7 %
Neutro Abs: 8.9 10*3/uL — ABNORMAL HIGH (ref 1.7–7.7)
Neutrophils Relative %: 73 %
Platelets: 282 10*3/uL (ref 150–400)
RBC: 5.76 MIL/uL (ref 4.22–5.81)
RDW: 14.2 % (ref 11.5–15.5)
WBC: 12.2 10*3/uL — AB (ref 4.0–10.5)

## 2016-08-15 LAB — MAGNESIUM: Magnesium: 1.9 mg/dL (ref 1.7–2.4)

## 2016-08-15 LAB — URINALYSIS, ROUTINE W REFLEX MICROSCOPIC
Glucose, UA: NEGATIVE mg/dL
Ketones, ur: 15 mg/dL — AB
Leukocytes, UA: NEGATIVE
NITRITE: NEGATIVE
PROTEIN: 100 mg/dL — AB
SPECIFIC GRAVITY, URINE: 1.019 (ref 1.005–1.030)
pH: 5 (ref 5.0–8.0)

## 2016-08-15 LAB — POTASSIUM: Potassium: 4.9 mmol/L (ref 3.5–5.1)

## 2016-08-15 LAB — LACTATE DEHYDROGENASE: LDH: 572 U/L — AB (ref 98–192)

## 2016-08-15 LAB — BASIC METABOLIC PANEL
Anion gap: 9 (ref 5–15)
BUN: 6 mg/dL (ref 6–20)
CO2: 19 mmol/L — ABNORMAL LOW (ref 22–32)
CREATININE: 0.55 mg/dL — AB (ref 0.61–1.24)
Calcium: 9.1 mg/dL (ref 8.9–10.3)
Chloride: 101 mmol/L (ref 101–111)
Glucose, Bld: 136 mg/dL — ABNORMAL HIGH (ref 65–99)
Potassium: 6.6 mmol/L (ref 3.5–5.1)
SODIUM: 129 mmol/L — AB (ref 135–145)

## 2016-08-15 LAB — CBC
HCT: 42.6 % (ref 39.0–52.0)
Hemoglobin: 15.2 g/dL (ref 13.0–17.0)
MCH: 28.2 pg (ref 26.0–34.0)
MCHC: 35.7 g/dL (ref 30.0–36.0)
MCV: 79 fL (ref 78.0–100.0)
PLATELETS: 318 10*3/uL (ref 150–400)
RBC: 5.39 MIL/uL (ref 4.22–5.81)
RDW: 14.6 % (ref 11.5–15.5)
WBC: 11.5 10*3/uL — AB (ref 4.0–10.5)

## 2016-08-15 LAB — I-STAT CHEM 8, ED
BUN: 7 mg/dL (ref 6–20)
CHLORIDE: 98 mmol/L — AB (ref 101–111)
Calcium, Ion: 1.11 mmol/L — ABNORMAL LOW (ref 1.15–1.40)
Creatinine, Ser: 1 mg/dL (ref 0.61–1.24)
GLUCOSE: 107 mg/dL — AB (ref 65–99)
HEMATOCRIT: 46 % (ref 39.0–52.0)
Hemoglobin: 15.6 g/dL (ref 13.0–17.0)
POTASSIUM: 3.8 mmol/L (ref 3.5–5.1)
SODIUM: 135 mmol/L (ref 135–145)
TCO2: 28 mmol/L (ref 0–100)

## 2016-08-15 LAB — LIPASE, BLOOD
LIPASE: 200 U/L — AB (ref 11–51)
LIPASE: 86 U/L — AB (ref 11–51)

## 2016-08-15 LAB — I-STAT TROPONIN, ED
TROPONIN I, POC: 0 ng/mL (ref 0.00–0.08)
Troponin i, poc: 0.01 ng/mL (ref 0.00–0.08)

## 2016-08-15 LAB — URINE MICROSCOPIC-ADD ON

## 2016-08-15 LAB — CK: CK TOTAL: 662 U/L — AB (ref 49–397)

## 2016-08-15 LAB — I-STAT CG4 LACTIC ACID, ED: LACTIC ACID, VENOUS: 1.34 mmol/L (ref 0.5–1.9)

## 2016-08-15 LAB — TROPONIN I

## 2016-08-15 MED ORDER — HYDROMORPHONE HCL 1 MG/ML IJ SOLN
1.0000 mg | Freq: Once | INTRAMUSCULAR | Status: AC
  Administered 2016-08-15: 1 mg via INTRAVENOUS
  Filled 2016-08-15: qty 1

## 2016-08-15 MED ORDER — OXYCODONE HCL 5 MG PO TABS
10.0000 mg | ORAL_TABLET | Freq: Once | ORAL | Status: AC
  Administered 2016-08-15: 10 mg via ORAL
  Filled 2016-08-15: qty 2

## 2016-08-15 MED ORDER — ONDANSETRON HCL 4 MG/2ML IJ SOLN
4.0000 mg | Freq: Once | INTRAMUSCULAR | Status: AC
  Administered 2016-08-15: 4 mg via INTRAVENOUS
  Filled 2016-08-15: qty 2

## 2016-08-15 MED ORDER — MORPHINE SULFATE (PF) 2 MG/ML IV SOLN: 2.0000 mg | INTRAVENOUS | Status: DC | PRN

## 2016-08-15 MED ORDER — SODIUM CHLORIDE 0.9 % IV SOLN
1000.0000 mL | Freq: Once | INTRAVENOUS | Status: AC
  Administered 2016-08-15: 1000 mL via INTRAVENOUS

## 2016-08-15 MED ORDER — HYDROMORPHONE HCL 1 MG/ML IJ SOLN
1.0000 mg | Freq: Once | INTRAMUSCULAR | Status: AC
Start: 2016-08-15 — End: 2016-08-15
  Administered 2016-08-15: 1 mg via INTRAVENOUS
  Filled 2016-08-15: qty 1

## 2016-08-15 MED ORDER — ACETAMINOPHEN 325 MG PO TABS
650.0000 mg | ORAL_TABLET | Freq: Four times a day (QID) | ORAL | Status: DC | PRN
  Administered 2016-08-16: 650 mg via ORAL
  Filled 2016-08-15: qty 2

## 2016-08-15 MED ORDER — SODIUM CHLORIDE 0.9 % IV SOLN
1000.0000 mL | INTRAVENOUS | Status: DC
  Administered 2016-08-15: 1000 mL via INTRAVENOUS

## 2016-08-15 MED ORDER — SODIUM CHLORIDE 0.9 % IV BOLUS (SEPSIS)
1000.0000 mL | Freq: Once | INTRAVENOUS | Status: AC
  Administered 2016-08-15: 1000 mL via INTRAVENOUS

## 2016-08-15 MED ORDER — SODIUM CHLORIDE 0.9 % IV SOLN
INTRAVENOUS | Status: AC
  Administered 2016-08-15 – 2016-08-16 (×3): via INTRAVENOUS

## 2016-08-15 MED ORDER — ACETAMINOPHEN 650 MG RE SUPP: 650.0000 mg | Freq: Four times a day (QID) | RECTAL | Status: DC | PRN

## 2016-08-15 NOTE — ED Notes (Signed)
WILL TRANSPORT PT TO 3W 1323-1. AAOX3. PT IN NO APPARENT DISTRESS WITH MILD PAIN. THE OPPORTUNITY TO ASK QUESTIONS WAS PROVIDED.

## 2016-08-15 NOTE — ED Notes (Signed)
3L [place] placed on pt, O2 sats dropping to 86% while pt sleeping

## 2016-08-15 NOTE — ED Notes (Signed)
PT INFORMED OF BED CHANGE. WILL CONTINUE TO MONITOR.

## 2016-08-15 NOTE — ED Notes (Signed)
Pt reports abdominal pain and "pancreatitis" that started yesterday.  Pt denies having any ETOH recently. Pt reports his pain level at a 9/10 at this time.   Chief Complaint  Patient presents with  . Chest Pain   Past Medical History:  Diagnosis Date  . Arthritis    right elbow pain all the time, took cortisone shot for  . GERD (gastroesophageal reflux disease)   . Pancreatitis 12-29-2013

## 2016-08-15 NOTE — ED Provider Notes (Signed)
[facility]PT Provider Note   CSN: [id number] Arrival date & time: 08/14/16  2334  By signing my name below, I, Jordan [name], attest that this documentation has been prepared under the direction and in the presence of Dione Booze, MD. Electronically Signed: Sandrea [name], ED Scribe. 08/15/16. 4:08 AM.   History   Chief Complaint Chief Complaint  Patient presents with  . Chest Pain    HPI Comments: Jordan [name] is a 50 y.o. male who presents to the Emergency Department complaining of 12/10 abdominal pain since last night. Pt has been admitted to the ED in the past for episodes of acute pancreatitis. Pt says his abdominal pain radiates to his back, chest, and stomach. He says he took tablets at home for his pain but is still in intense pain. Pt says that in the past his pain has improved with fluid, Zofran (ondansetron), and Dilaudid (hydromorphone). Pt says he has been vomiting for the last 9 hours. He denies taking any EtOH. Pt says he is followed by his PCP Dr. Kateri Plummer.   The history is provided by the patient. No language interpreter was used.    Past Medical History:  Diagnosis Date  . Arthritis    right elbow pain all the time, took cortisone shot for  . GERD (gastroesophageal reflux disease)   . Pancreatitis 12-29-2013    Patient Active Problem List   Diagnosis Date Noted  . Hypertriglyceridemia 12/31/2013  . Pancreatitis 12/29/2013  . Abdominal pain 12/29/2013  . Nausea and vomiting 12/29/2013  . Tobacco use 12/29/2013  . Leukocytosis 12/29/2013    Past Surgical History:  Procedure Laterality Date  . EUS N/A 08/12/2014   Procedure: ESOPHAGEAL ENDOSCOPIC ULTRASOUND (EUS) RADIAL;  Surgeon: Willis Modena, MD;  Location: [facility];  Service: Endoscopy;  Laterality: N/A;  . FINE NEEDLE ASPIRATION N/A 08/12/2014   Procedure: FINE NEEDLE ASPIRATION (FNA) LINEAR;  Surgeon: Willis Modena, MD;  Location: [facility];  Service: Endoscopy;  Laterality: N/A;  . NO  PAST SURGERIES         Home Medications    Prior to Admission medications   Medication Sig Start Date End Date Taking? Authorizing Provider  HYDROcodone-acetaminophen (NORCO/VICODIN) 5-325 MG tablet Take 2 tablets by mouth every 4 (four) hours as needed. Patient taking differently: Take 1-2 tablets by mouth every 4 (four) hours as needed for moderate pain.  06/25/16   Dan Humphreys, MD  ibuprofen (ADVIL,MOTRIN) 200 MG tablet Take 400 mg by mouth every 6 (six) hours as needed for moderate pain.    Historical Provider, MD  niacin 500 MG CR capsule Take 1 capsule (500 mg total) by mouth at bedtime. Patient not taking: Reported on 11/27/2015 03/02/15   Raeford Razor, MD  Omega-3 Fatty Acids (FISH OIL PO) Take 1 capsule by mouth daily.     Historical Provider, MD  ondansetron (ZOFRAN) 4 MG tablet Take 1 tablet (4 mg total) by mouth every 6 (six) hours. Patient not taking: Reported on 07/16/2016 06/25/16   Dan Humphreys, MD  OVER THE COUNTER MEDICATION Take 1 tablet by mouth daily. [employer] Anti Acid    Historical Provider, MD  Oxycodone HCl 10 MG TABS Take 1 tablet (10 mg total) by mouth 3 (three) times daily as needed. 07/16/16   Nelva Nay, MD  oxyCODONE-acetaminophen (PERCOCET/ROXICET) 5-325 MG tablet Take 1-2 tablets by mouth every 6 (six) hours as needed for severe pain. Patient not taking: Reported on 07/16/2016 11/27/15   Danelle Berry, PA-C  pantoprazole (PROTONIX) 20 MG  tablet Take 1 tablet (20 mg total) by mouth daily. Patient not taking: Reported on 07/16/2016 06/25/16 07/25/16  Dan HumphreysMichael Irick, MD  promethazine (PHENERGAN) 25 MG tablet Take 25 mg by mouth every 6 (six) hours as needed for nausea or vomiting.    Historical Provider, MD    Family History Family History  Problem Relation Age of Onset  . Other Mother     Healthy  . Other Father     Healthy    Social History Social History  Substance Use Topics  . Smoking status: Current Every Day Smoker    Packs/day: 0.00    Years: 0.00     Types: Cigarettes  . Smokeless tobacco: Never Used  . Alcohol use Yes     Allergies   Fish allergy   Review of Systems Review of Systems  Cardiovascular: Positive for chest pain.  Gastrointestinal: Positive for abdominal pain and vomiting.  Musculoskeletal: Positive for back pain.  All other systems reviewed and are negative.    Physical Exam Updated Vital Signs BP (!) 187/118 (BP Location: Right Arm)   Pulse 62   Temp 97.4 F (36.3 C) (Oral)   Resp 25   Ht 5\' 5"  (1.651 m)   Wt 185 lb (83.9 kg)   SpO2 100%   BMI 30.79 kg/m   Physical Exam  Constitutional: He is oriented to person, place, and time. He appears well-developed and well-nourished.  Pt appears to be in pain   HENT:  Head: Normocephalic and atraumatic.  Eyes: EOM are normal. Pupils are equal, round, and reactive to light.  Neck: Normal range of motion. Neck supple. No JVD present.  Cardiovascular: Normal rate, regular rhythm and normal heart sounds.   No murmur heard. Pulmonary/Chest: Effort normal and breath sounds normal. He has no wheezes. He has no rales. He exhibits no tenderness.  Abdominal: There is tenderness.  Moderate tenderness to epigastric region. Bowel sounds decresaed  Musculoskeletal: Normal range of motion. He exhibits no edema.  Lymphadenopathy:    He has no cervical adenopathy.  Neurological: He is alert and oriented to person, place, and time. No cranial nerve deficit. He exhibits normal muscle tone. Coordination normal.  Skin: Skin is warm and dry. No rash noted.  Psychiatric: He has a normal mood and affect. His behavior is normal. Judgment and thought content normal.  Nursing note and vitals reviewed.    ED Treatments / Results   DIAGNOSTIC STUDIES: Oxygen Saturation is 100% on RA, normal by my interpretation.    COORDINATION OF CARE: 3:59 AM Discussed treatment plan with pt at bedside and pt agreed to plan.   Labs (all labs ordered are listed, but only abnormal results  are displayed) Labs Reviewed  BASIC METABOLIC PANEL - Abnormal; Notable for the following:       Result Value   Sodium 129 (*)    Potassium 6.6 (*)    CO2 19 (*)    Glucose, Bld 136 (*)    Creatinine, Ser 0.55 (*)    All other components within normal limits  CBC - Abnormal; Notable for the following:    WBC 11.5 (*)    All other components within normal limits  LIPASE, BLOOD - Abnormal; Notable for the following:    Lipase 86 (*)    All other components within normal limits  POTASSIUM  [facility] SensorI-STAT TROPOININ, ED  Rose[facility]a SensorI-STAT TROPOININ, ED    EKG  EKG Interpretation  Date/Time:  Monday August 14 2016 23:47:46 EDT Ventricular Rate:  63 PR Interval:  170 QRS Duration: 88 QT Interval:  442 QTC Calculation: 452 R Axis:   64 Text Interpretation:  Normal sinus rhythm Nonspecific ST abnormality Abnormal ECG When compared with ECG of 07/16/2016, No significant change was found Confirmed by [facility]  MD, Mckaylin Bastien ([id number]) on 08/15/2016 1:13:50 AM       Radiology Dg Chest 2 View  Result Date: 08/15/2016 CLINICAL DATA:  50 year old male with chest pain EXAM: CHEST  2 VIEW COMPARISON:  None. FINDINGS: Two views of the chest demonstrate clear lungs. There is no pleural effusion or pneumothorax. The cardiac silhouette is within normal limits. No acute osseous pathology. IMPRESSION: No active cardiopulmonary disease. Electronically Signed   By: Elgie Collard M.D.   On: 08/15/2016 01:47    Procedures Procedures (including critical care time)  Medications Ordered in ED Medications  0.9 %  sodium chloride infusion (0 mLs Intravenous Stopped 08/15/16 0559)    Followed by  0.9 %  sodium chloride infusion (0 mLs Intravenous Stopped 08/15/16 0559)  oxyCODONE (Oxy IR/ROXICODONE) immediate release tablet 10 mg (not administered)  ondansetron (ZOFRAN-ODT) disintegrating tablet 8 mg (8 mg Oral Given 08/15/16 0000)  ondansetron (ZOFRAN) injection 4 mg (4 mg Intravenous Given 08/15/16 0418)  HYDROmorphone  (DILAUDID) injection 1 mg (1 mg Intravenous Given 08/15/16 0417)  HYDROmorphone (DILAUDID) injection 1 mg (1 mg Intravenous Given 08/15/16 6213)     Initial Impression / Assessment and Plan / ED Course  I have reviewed the triage vital signs and the nursing notes.  Pertinent labs & imaging results that were available during my care of the patient were reviewed by me and considered in my medical decision making (see chart for details).  Clinical Course   Abdominal pain certainly consistent with pancreatitis. Old records are reviewed, and he has several ED visits for pancreatitis. Pancreatitis is felt to be secondary to hypertriglyceridemia. He had been admitted 2 years ago for pancreatitis, but he is usually able to be managed in the ED. He is given IV fluids, hydromorphone with good relief of pain after 2 doses. Of note, initial potassium was elevated but specimen was hemolyzed. Repeat potassium was normal. He has prescription at home for oxycodone. He is advised to continue taking that as needed, return should symptoms worsen.  Final Clinical Impressions(s) / ED Diagnoses   Final diagnoses:  Other acute pancreatitis without infection or necrosis  Hyponatremia    New Prescriptions Current Discharge Medication List     I personally performed the services described in this documentation, which was scribed in my presence. The recorded information has been reviewed and is accurate.        Dione Booze, MD 08/15/16 [phone]

## 2016-08-15 NOTE — ED Notes (Signed)
EDP at bedside  

## 2016-08-15 NOTE — ED Notes (Signed)
MD at bedside. 

## 2016-08-15 NOTE — ED Triage Notes (Signed)
Per EMS, pt from home.  Pt was at [facility] today and dx with pancreatitis.  Pt sent home.  Pt states not getting better and he has pain in his back which is not normal for his pancreatitis.  Vitals: 152/96, hr 80, 100% ra,

## 2016-08-15 NOTE — H&P (Signed)
History and Physical    Jordan MillinJutono Sarli RUE:[id number] DOB: 06/18/1966 DOA: 08/15/2016  PCP: Farris HasMORROW, AARON, MD  Patient coming from: Home.  Chief Complaint: Abdominal pain.  HPI: Jordan [name] is a 50 y.o. 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 Patient had come to the ER about 12 hours ago and was managed symptomatically and discharge. Despite which patient was still having significant pain. Pain is mostly epigastric area but has started to move to the left upper quadrant and chest. Patient's lipase is increasing from previous. Patient is being admitted for further management of acute pancreatitis. Denies drinking alcohol.  ED Course: IV fluids and pain medications were given. Labs also revealed elevated LDH and CK levels. Troponin and EKG did not show anything acute but did show prolonged QT.  Review of Systems: As per HPI, rest all negative.   Past Medical History:  Diagnosis Date  . Arthritis    right elbow pain all the time, took cortisone shot for  . GERD (gastroesophageal reflux disease)   . Pancreatitis 12-29-2013    Past Surgical History:  Procedure Laterality Date  . EUS N/A 08/12/2014   Procedure: ESOPHAGEAL ENDOSCOPIC ULTRASOUND (EUS) RADIAL;  Surgeon: Willis ModenaWilliam Outlaw, MD;  Location: [facility] ENDOSCOPY;  Service: Endoscopy;  Laterality: N/A;  . FINE NEEDLE ASPIRATION N/A 08/12/2014   Procedure: FINE NEEDLE ASPIRATION (FNA) LINEAR;  Surgeon: Willis ModenaWilliam Outlaw, MD;  Location: [facility]COPY;  Service: Endoscopy;  Laterality: N/A;  . NO PAST SURGERIES       reports that he has been smoking Cigarettes.  He has been smoking about 0.00 packs per day for the past 0.00 years. He has never used smokeless tobacco. He reports that he drinks alcohol. He reports that he does not use drugs.  Allergies  Allergen Reactions  . Fish Allergy Nausea And Vomiting and  Other (See Comments)    Can eat shrimp & scallops...sometimes crab    Family History  Problem Relation Age of Onset  . Other Mother     Healthy  . Other Father     Healthy  . Pancreatitis Neg Hx     Prior to Admission medications   Medication Sig Start Date End Date Taking? Authorizing Provider  Fructose-Dextrose-Phosphor Acd (NAUSEA CONTROL PO) Take 2 tablets by mouth once.   Yes Historical Provider, MD  Omega-3 Fatty Acids (FISH OIL PO) Take 1 capsule by mouth daily.    Yes Historical Provider, MD  Oxycodone HCl 10 MG TABS Take 1 tablet (10 mg total) by mouth 3 (three) times daily as needed. Patient not taking: Reported on 08/15/2016 07/16/16   Nelva Nayobert Beaton, MD    Physical Exam: Vitals:   08/15/16 1709 08/15/16 1918 08/15/16 2133 08/15/16 2219  BP:  108/77 130/92 144/91  Pulse:  87 67 84  Resp:  19 18 20   Temp:    98.2 F (36.8 C)  TempSrc:    Oral  SpO2:  96% 100% 100%  Weight: 185 lb (83.9 kg)     Height: 5\' 5"  (1.651 m)         Constitutional: Not in distress. Vitals:   08/15/16 1709 08/15/16 1918 08/15/16 2133 08/15/16 2219  BP:  108/77 130/92 144/91  Pulse:  87 67 84  Resp:  19 18 20   Temp:    98.2 F (36.8 C)  TempSrc:    Oral  SpO2:  96% 100% 100%  Weight: 185 lb (83.9 kg)     Height: 5\' 5"  (1.651 m)      Eyes: Anicteric no pallor. ENMT: No discharge from the ears eyes nose and mouth. Neck: No JVD appreciated no mass felt. Respiratory: No rhonchi or crepitations. Cardiovascular: S1-S2 heard. No murmurs appreciated. Abdomen: Soft mild epigastric tenderness no guarding or rigidity. Musculoskeletal: No edema no joint effusion. Skin: No rash Neurologic: Alert awake oriented to time place and person moves all extremities. Psychiatric: Appears normal.   Labs on Admission: I have personally reviewed following labs and imaging studies  CBC:  Recent Labs Lab 08/14/16 2358 08/15/16 1903 08/15/16 2027  WBC 11.5* 12.2*  --   NEUTROABS  --  8.9*  --    HGB 15.2 16.1 15.6  HCT 42.6 43.9 46.0  MCV 79.0 76.2*  --   PLT 318 282  --    Basic Metabolic Panel:  Recent Labs Lab 08/14/16 2358 08/15/16 0443 08/15/16 1903 08/15/16 2027  NA 129*  --  129* 135  K 6.6* 4.9 5.2* 3.8  CL 101  --  95* 98*  CO2 19*  --  24  --   GLUCOSE 136*  --  121* 107*  BUN 6  --  8 7  CREATININE 0.55*  --  1.07 1.00  CALCIUM 9.1  --  9.2  --    GFR: Estimated Creatinine Clearance: 90.1 mL/min (by C-G formula based on SCr of 1 mg/dL). Liver Function Tests:  Recent Labs Lab 08/15/16 1903  AST 68*  ALT 46  ALKPHOS 41  BILITOT 2.4*  PROT 7.6  ALBUMIN 4.4    Recent Labs Lab 08/14/16 2358 08/15/16 1903  LIPASE 86* 200*   No results for input(s): AMMONIA in the last 168 hours. Coagulation Profile: No results for input(s): INR, PROTIME in the last 168 hours. Cardiac Enzymes:  Recent Labs Lab 08/15/16 1903  CKTOTAL 662*   BNP (last 3 results) No results for input(s): PROBNP in the last 8760 hours. HbA1C: No results for input(s): HGBA1C in the last 72 hours. CBG: No results for input(s): GLUCAP in the last 168 hours. Lipid Profile: No results for input(s): CHOL, HDL, LDLCALC, TRIG, CHOLHDL, LDLDIRECT in the last 72 hours. Thyroid Function Tests: No results for input(s): TSH, T4TOTAL, FREET4, T3FREE, THYROIDAB in the last 72 hours. Anemia Panel: No results for input(s): VITAMINB12, FOLATE, FERRITIN, TIBC, IRON, RETICCTPCT in the last 72 hours. Urine analysis:    Component Value Date/Time   COLORURINE YELLOW 08/15/2016 1841   APPEARANCEUR CLOUDY (A) 08/15/2016 1841   LABSPEC 1.019 08/15/2016 1841   PHURINE 5.0 08/15/2016 1841   GLUCOSEU NEGATIVE 08/15/2016 1841   HGBUR LARGE (A) 08/15/2016 1841   BILIRUBINUR SMALL (A) 08/15/2016 1841   KETONESUR 15 (A) 08/15/2016 1841   PROTEINUR 100 (A) 08/15/2016 1841   UROBILINOGEN 1.0 03/02/2015 1224   NITRITE NEGATIVE 08/15/2016 1841   LEUKOCYTESUR NEGATIVE 08/15/2016 1841   Sepsis  Labs: @LABRCNTIP (procalcitonin:4,lacticidven:4) )No results found for this or any previous visit (from the past 240 hour(s)).   Radiological Exams on Admission: Dg Chest 2 View  Result Date: 08/15/2016 CLINICAL DATA:  50 year old male with chest pain EXAM: CHEST  2 VIEW COMPARISON:  None. FINDINGS: Two views of the chest demonstrate clear lungs. There is no pleural effusion or pneumothorax. The cardiac silhouette is within normal limits. No acute osseous pathology. IMPRESSION: No active cardiopulmonary disease. Electronically Signed   By: Elgie Collard M.D.   On: 08/15/2016 01:47  EKG: Independently reviewed. Normal sinus rhythm with prolonged QT.  Assessment/Plan Principal Problem:   Acute pancreatitis Active Problems:   Tobacco use   Hypertriglyceridemia   Elevated blood pressure    1. Acute pancreatitis most likely secondary to hypertriglyceridemia - since patient has persistent abdominal pain we will keep patient nothing by mouth. Continue with IV fluids and pain medications. Check triglyceride levels. Patient denies drinking alcohol. Troponins are pending. 2. Prolonged QT - try to avoid QT prolongation medications. 3. Elevated blood pressure probably secondary to #1 - closely follow blood pressure trends. 4. Elevated CK levels probably secondary to mild rhabdomyolysis - continue with hydration and follow CK levels. 5. Tobacco abuse - tobacco cessation counseling requested.   DVT prophylaxis: SCDs. Code Status: Full code.  Family Communication: Discussed with patient.  Disposition Plan: Home.  Consults called: None.  Admission status: Observation.    Eduard Clos MD [facility] Pager [phone].  If 7PM-7AM, please contact night-coverage [email or web address] Password [facility]  08/15/2016, 10:34 PM

## 2016-08-16 DIAGNOSIS — E781 Pure hyperglyceridemia: Secondary | ICD-10-CM

## 2016-08-16 LAB — COMPREHENSIVE METABOLIC PANEL
ALBUMIN: 3.9 g/dL (ref 3.5–5.0)
ALT: 32 U/L (ref 17–63)
AST: 28 U/L (ref 15–41)
Alkaline Phosphatase: 38 U/L (ref 38–126)
Anion gap: 10 (ref 5–15)
BUN: 10 mg/dL (ref 6–20)
CO2: 23 mmol/L (ref 22–32)
Calcium: 8.8 mg/dL — ABNORMAL LOW (ref 8.9–10.3)
Chloride: 102 mmol/L (ref 101–111)
Creatinine, Ser: 0.91 mg/dL (ref 0.61–1.24)
GFR calc Af Amer: >60 mL/min (ref >60)
GLUCOSE: 97 mg/dL (ref 65–99)
POTASSIUM: 4.1 mmol/L (ref 3.5–5.1)
SODIUM: 135 mmol/L (ref 135–145)
TOTAL PROTEIN: 7 g/dL (ref 6.5–8.1)
Total Bilirubin: 1.9 mg/dL — ABNORMAL HIGH (ref 0.3–1.2)

## 2016-08-16 LAB — CBC
HEMATOCRIT: 40.8 % (ref 39.0–52.0)
HEMOGLOBIN: 13.9 g/dL (ref 13.0–17.0)
MCH: 27 pg (ref 26.0–34.0)
MCHC: 34.1 g/dL (ref 30.0–36.0)
MCV: 79.4 fL (ref 78.0–100.0)
Platelets: 240 10*3/uL (ref 150–400)
RBC: 5.14 MIL/uL (ref 4.22–5.81)
RDW: 14.6 % (ref 11.5–15.5)
WBC: 9.4 10*3/uL (ref 4.0–10.5)

## 2016-08-16 LAB — GLUCOSE, CAPILLARY
GLUCOSE-CAPILLARY: 112 mg/dL — AB (ref 65–99)
GLUCOSE-CAPILLARY: 81 mg/dL (ref 65–99)
Glucose-Capillary: 104 mg/dL — ABNORMAL HIGH (ref 65–99)

## 2016-08-16 LAB — LIPID PANEL
Cholesterol: 306 mg/dL — ABNORMAL HIGH (ref 0–200)
HDL: 20 mg/dL — AB (ref >40)
LDL CALC: UNDETERMINED mg/dL (ref 0–99)
Total CHOL/HDL Ratio: 15.3 RATIO
Triglycerides: 1130 mg/dL — ABNORMAL HIGH (ref <150)
VLDL: UNDETERMINED mg/dL (ref 0–40)

## 2016-08-16 LAB — CK: Total CK: 453 U/L — ABNORMAL HIGH (ref 49–397)

## 2016-08-16 LAB — LIPASE, BLOOD: LIPASE: 82 U/L — AB (ref 11–51)

## 2016-08-16 MED ORDER — PNEUMOCOCCAL VAC POLYVALENT 25 MCG/0.5ML IJ INJ
0.5000 mL | INJECTION | INTRAMUSCULAR | Status: AC
  Administered 2016-08-17: 0.5 mL via INTRAMUSCULAR
  Filled 2016-08-16 (×2): qty 0.5

## 2016-08-16 MED ORDER — MORPHINE SULFATE (PF) 2 MG/ML IV SOLN: 2.0000 mg | INTRAVENOUS | Status: DC | PRN

## 2016-08-16 MED ORDER — ORAL CARE MOUTH RINSE
15.0000 mL | Freq: Two times a day (BID) | OROMUCOSAL | Status: DC
  Administered 2016-08-16: 15 mL via OROMUCOSAL

## 2016-08-16 MED ORDER — INFLUENZA VAC SPLIT QUAD 0.5 ML IM SUSY
0.5000 mL | PREFILLED_SYRINGE | INTRAMUSCULAR | Status: AC
  Administered 2016-08-17: 0.5 mL via INTRAMUSCULAR

## 2016-08-16 MED ORDER — NICOTINE 21 MG/24HR TD PT24
21.0000 mg | MEDICATED_PATCH | Freq: Every day | TRANSDERMAL | Status: DC
  Administered 2016-08-17: 21 mg via TRANSDERMAL
  Filled 2016-08-16 (×2): qty 1

## 2016-08-16 MED ORDER — CHLORHEXIDINE GLUCONATE 0.12 % MT SOLN
15.0000 mL | Freq: Two times a day (BID) | OROMUCOSAL | Status: DC
  Administered 2016-08-16 – 2016-08-17 (×3): 15 mL via OROMUCOSAL
  Filled 2016-08-16 (×3): qty 15

## 2016-08-16 NOTE — ED Provider Notes (Signed)
[facility]-EMERGENCY DEPT Provider Note   CSN: [id number] Arrival date & time: 08/15/16  1430     History   Chief Complaint Chief Complaint  Patient presents with  . Back Pain  . Pancreatitis    HPI Jordan [name] is a 50 y.o. male.  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 Patient was seen in [facility] ER approx 12 hours ago for same and diagnosed with acute pancreatitis. He was managed symptomatically and discharge home. At home continued to have pain and nausea/vomiting. Describes epigastric pain that radiates to the lower back. Nothing makes better or worse. He has tried percocet pain meds at home that has not helped his pain. He is not able to keep anything by po down. Patient denies alcohol use.            Past Medical History:  Diagnosis Date  . Arthritis    right elbow pain all the time, took cortisone shot for  . GERD (gastroesophageal reflux disease)   . Pancreatitis 12-29-2013    Patient Active Problem List   Diagnosis Date Noted  . Acute pancreatitis 08/15/2016  . Elevated blood pressure 08/15/2016  . Hypertriglyceridemia 12/31/2013  . Pancreatitis 12/29/2013  . Abdominal pain 12/29/2013  . Nausea and vomiting 12/29/2013  . Tobacco use 12/29/2013  . Leukocytosis 12/29/2013    Past Surgical History:  Procedure Laterality Date  . EUS N/A 08/12/2014   Procedure: ESOPHAGEAL ENDOSCOPIC ULTRASOUND (EUS) RADIAL;  Surgeon: Willis Modena, MD;  Location: [facility] ENDOSCOPY;  Service: Endoscopy;  Laterality: N/A;  . FINE NEEDLE ASPIRATION N/A 08/12/2014   Procedure: FINE NEEDLE ASPIRATION (FNA) LINEAR;  Surgeon: Willis Modena, MD;  Location: [facility] ENDOSCOPY;  Service: Endoscopy;  Laterality: N/A;  . NO PAST SURGERIES         Home Medications    Prior to Admission medications   Medication Sig Start Date End Date Taking? Authorizing Provider    Fructose-Dextrose-Phosphor Acd (NAUSEA CONTROL PO) Take 2 tablets by mouth once.   Yes Historical Provider, MD  Omega-3 Fatty Acids (FISH OIL PO) Take 1 capsule by mouth daily.    Yes Historical Provider, MD  Oxycodone HCl 10 MG TABS Take 1 tablet (10 mg total) by mouth 3 (three) times daily as needed. Patient not taking: Reported on 08/15/2016 07/16/16   Nelva Nay, MD    Family History Family History  Problem Relation Age of Onset  . Other Mother     Healthy  . Other Father     Healthy  . Pancreatitis Neg Hx     Social History Social History  Substance Use Topics  . Smoking status: Current Every Day Smoker    Packs/day: 0.00    Years: 0.00    Types: Cigarettes  . Smokeless tobacco: Never Used  . Alcohol use Yes     Allergies   Fish allergy   Review of Systems Review of Systems  Constitutional: Negative for chills and fever.  HENT: Negative for congestion, ear pain, rhinorrhea and sore throat.   Eyes: Negative for pain and discharge.  Respiratory: Negative for cough and shortness of breath.   Cardiovascular: Negative for chest pain and palpitations.  Gastrointestinal: Positive for abdominal pain, nausea and vomiting. Negative for blood in stool and diarrhea.  Genitourinary: Negative for flank pain, frequency, hematuria and urgency.  Musculoskeletal: Negative for myalgias and neck pain.  Neurological: Negative for dizziness, syncope, weakness, light-headedness, numbness  and headaches.  All other systems reviewed and are negative.    Physical Exam Updated Vital Signs BP 123/77 (BP Location: Left Arm)   Pulse 71   Temp 98.6 F (37 C) (Oral)   Resp 20   Ht 5\' 5"  (1.651 m)   Wt 87.1 kg   SpO2 100%   BMI 31.95 kg/m   Physical Exam  Constitutional: He appears well-developed and well-nourished. No distress.  HENT:  Head: Normocephalic and atraumatic.  Mouth/Throat: Oropharynx is clear and moist.  Eyes: Conjunctivae are normal. Right eye exhibits no  discharge. Left eye exhibits no discharge. No scleral icterus.  Neck: Normal range of motion. Neck supple. No thyromegaly present.  Cardiovascular: Normal rate, regular rhythm, normal heart sounds and intact distal pulses.   Pulmonary/Chest: Effort normal and breath sounds normal. No respiratory distress. He has no wheezes.  Abdominal: Soft. Bowel sounds are normal. He exhibits no distension. There is tenderness in the right upper quadrant and epigastric area. There is no rigidity, no rebound and no guarding.  Musculoskeletal: Normal range of motion.  Lymphadenopathy:    He has no cervical adenopathy.  Neurological: He is alert.  Skin: Skin is warm and dry. Capillary refill takes less than 2 seconds.  Vitals reviewed.    ED Treatments / Results  Labs (all labs ordered are listed, but only abnormal results are displayed) Labs Reviewed  COMPREHENSIVE METABOLIC PANEL - Abnormal; Notable for the following:       Result Value   Sodium 129 (*)    Potassium 5.2 (*)    Chloride 95 (*)    Glucose, Bld 121 (*)    AST 68 (*)    Total Bilirubin 2.4 (*)    All other components within normal limits  CBC WITH DIFFERENTIAL/PLATELET - Abnormal; Notable for the following:    WBC 12.2 (*)    MCV 76.2 (*)    MCHC 36.7 (*)    Neutro Abs 8.9 (*)    All other components within normal limits  URINALYSIS, ROUTINE W REFLEX MICROSCOPIC (NOT AT [facility]) - Abnormal; Notable for the following:    APPearance CLOUDY (*)    Hgb urine dipstick LARGE (*)    Bilirubin Urine SMALL (*)    Ketones, ur 15 (*)    Protein, ur 100 (*)    All other components within normal limits  LACTATE DEHYDROGENASE - Abnormal; Notable for the following:    LDH 572 (*)    All other components within normal limits  URINE MICROSCOPIC-ADD ON - Abnormal; Notable for the following:    Squamous Epithelial / LPF 0-5 (*)    Bacteria, UA FEW (*)    All other components within normal limits  LIPASE, BLOOD - Abnormal; Notable for the  following:    Lipase 200 (*)    All other components within normal limits  CK - Abnormal; Notable for the following:    Total CK 662 (*)    All other components within normal limits  LIPID PANEL - Abnormal; Notable for the following:    Cholesterol 306 (*)    Triglycerides 1,130 (*)    HDL 20 (*)    All other components within normal limits  COMPREHENSIVE METABOLIC PANEL - Abnormal; Notable for the following:    Calcium 8.8 (*)    Total Bilirubin 1.9 (*)    All other components within normal limits  CK - Abnormal; Notable for the following:    Total CK 453 (*)    All  other components within normal limits  GLUCOSE, CAPILLARY - Abnormal; Notable for the following:    Glucose-Capillary 112 (*)    All other components within normal limits  GLUCOSE, CAPILLARY - Abnormal; Notable for the following:    Glucose-Capillary 104 (*)    All other components within normal limits  I-STAT CHEM 8, ED - Abnormal; Notable for the following:    Chloride 98 (*)    Glucose, Bld 107 (*)    Calcium, Ion 1.11 (*)    All other components within normal limits  TROPONIN I  MAGNESIUM  CBC  I-STAT CG4 LACTIC ACID, ED    EKG  EKG Interpretation None       Radiology Dg Chest 2 View  Result Date: 08/15/2016 CLINICAL DATA:  50 year old male with chest pain EXAM: CHEST  2 VIEW COMPARISON:  None. FINDINGS: Two views of the chest demonstrate clear lungs. There is no pleural effusion or pneumothorax. The cardiac silhouette is within normal limits. No acute osseous pathology. IMPRESSION: No active cardiopulmonary disease. Electronically Signed   By: Elgie CollardArash  Radparvar M.D.   On: 08/15/2016 01:47    Procedures Procedures (including critical care time)  Medications Ordered in ED Medications  acetaminophen (TYLENOL) tablet 650 mg (not administered)    Or  acetaminophen (TYLENOL) suppository 650 mg (not administered)  0.9 %  sodium chloride infusion ( Intravenous New Bag/Given 08/16/16 0647)  morphine 2  MG/ML injection 2 mg (not administered)  chlorhexidine (PERIDEX) 0.12 % solution 15 mL (15 mLs Mouth Rinse Not Given 08/16/16 1000)  MEDLINE mouth rinse (not administered)  Influenza vac split quadrivalent PF (FLUARIX) injection 0.5 mL (not administered)  HYDROmorphone (DILAUDID) injection 1 mg (1 mg Intravenous Given 08/15/16 1903)  ondansetron (ZOFRAN) injection 4 mg (4 mg Intravenous Given 08/15/16 1903)  sodium chloride 0.9 % bolus 1,000 mL (0 mLs Intravenous Stopped 08/15/16 2030)     Initial Impression / Assessment and Plan / ED Course  I have reviewed the triage vital signs and the nursing notes.  Pertinent labs & imaging results that were available during my care of the patient were reviewed by me and considered in my medical decision making (see chart for details).  Clinical Course  Patient presents to ED with continued pain due to acute pancreatitis. Unable to manage symptoms at home. Lipase was noted to be 200 up from 63 ealier this AM. Mild leukocytosis noted. CMP revealed elevated potassium and hyponatremia but blood was hemolyzed. Recollect revealed normal electrolytes. LDH and CK elevated likey due to dehydration. IV fluids and pain medicine given in ED. EKG without acute findings, does show prolonged QT. Will call for admission to hospital medicine for to further manage acute pancreatitis. Talked with Dr. Toniann FailKakrakandy who agrees to admit patient. Patient is agreeable to plan.   Final Clinical Impressions(s) / ED Diagnoses   Final diagnoses:  Other acute pancreatitis  Non-intractable vomiting with nausea, vomiting of unspecified type    New Prescriptions Current Discharge Medication List       Rise MuKenneth T Leaphart, PA-C 08/16/16 1016    Heide Scaleshristopher J Tegeler, MD 08/16/16 1122

## 2016-08-16 NOTE — Care Management Note (Signed)
Case Management Note  Patient Details  Name: Jordan [name] MRN: [id number] Date of Birth: June 20, 1966  Subjective/Objective:  50 y.o. [name] admitted with Back Pain and Pancreatitis from home.                   Action/Plan: Anticipate no discharge needs but will continue to follow.    Expected Discharge Date:                  Expected Discharge Plan:     In-House Referral:     Discharge planning Services  CM Consult  Post Acute Care Choice:    Choice offered to:     DME Arranged:    DME Agency:     HH Arranged:    HH Agency:     Status of Service:  In process, will continue to follow  If discussed at Long Length of Stay Meetings, dates discussed:    Additional Comments:  Jordan [name], Jordan Lucier M, RN 08/16/2016, 11:02 AM

## 2016-08-16 NOTE — Progress Notes (Signed)
PROGRESS NOTE    Jordan [name]  ZOX:[id number] DOB: September 17, 1966 DOA: 08/15/2016 PCP: Farris Has, MD    Brief Narrative:Jordan [name] is a 50 y.o. 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   Assessment & Plan:   Principal Problem:   Acute pancreatitis Active Problems:   Tobacco use   Hypertriglyceridemia   Elevated blood pressure   Pancreatitis from hypertriglyceridemia; Initially npO,now pain has improved, started diet and advance as tolerated.  Resume fluids. Trend lipase.    Prolonged QT INTERVAL: Repeat EKG today.    Mild rhabdomyolysis: Improving. Repeat CK levels in am.    DVT prophylaxis: (Lovenox) Code Status: full code.  Family Communication: none at bedside.  Disposition Plan: home when able to tolerate regular diet.    Consultants:  none  Procedures: none   Antimicrobials: none   Subjective: abd pain, nausea better.   Objective: Vitals:   08/15/16 2329 08/15/16 2335 08/16/16 0444 08/16/16 1316  BP:  131/85 123/77 (!) 144/85  Pulse:  86 71 71  Resp:  18 20 20   Temp:  98.8 F (37.1 C) 98.6 F (37 C) 98.6 F (37 C)  TempSrc:  Oral Oral Oral  SpO2:  98% 100% 99%  Weight: 87.1 kg (192 lb)  87.1 kg (191 lb 15.9 oz)   Height: 5\' 5"  (1.651 m)       Intake/Output Summary (Last 24 hours) at 08/16/16 1358 Last data filed at 08/16/16 1235  Gross per 24 hour  Intake           2427.5 ml  Output                0 ml  Net           2427.5 ml   Filed Weights   08/15/16 1709 08/15/16 2329 08/16/16 0444  Weight: 83.9 kg (185 lb) 87.1 kg (192 lb) 87.1 kg (191 lb 15.9 oz)    Examination:  General exam: Appears calm and comfortable  Respiratory system: Clear to auscultation. Respiratory effort normal. Cardiovascular system: S1 & S2 heard, RRR. No JVD, murmurs, rubs, gallops or clicks. No pedal  edema. Gastrointestinal system: Abdomen is soft mode tenderness, bowel sounds heard.  Central nervous system: Alert and oriented. No focal neurological deficits. Extremities: Symmetric 5 x 5 power. Skin: No rashes, lesions or ulcers Psychiatry: Judgement and insight appear normal. Mood & affect appropriate.     Data Reviewed: I have personally reviewed following labs and imaging studies  CBC:  Recent Labs Lab 08/14/16 2358 08/15/16 1903 08/15/16 2027 08/16/16 0526  WBC 11.5* 12.2*  --  9.4  NEUTROABS  --  8.9*  --   --   HGB 15.2 16.1 15.6 13.9  HCT 42.6 43.9 46.0 40.8  MCV 79.0 76.2*  --  79.4  PLT 318 282  --  240   Basic Metabolic Panel:  Recent Labs Lab 08/14/16 2358 08/15/16 0443 08/15/16 1903 08/15/16 2027 08/15/16 2243 08/16/16 0526  NA 129*  --  129* 135  --  135  K 6.6* 4.9 5.2* 3.8  --  4.1  CL 101  --  95* 98*  --  102  CO2 19*  --  24  --   --  23  GLUCOSE 136*  --  121* 107*  --  97  BUN 6  --  8 7  --  10  CREATININE 0.55*  --  1.07 1.00  --  0.91  CALCIUM 9.1  --  9.2  --   --  8.8*  MG  --   --   --   --  1.9  --    GFR: Estimated Creatinine Clearance: 100.7 mL/min (by C-G formula based on SCr of 0.91 mg/dL). Liver Function Tests:  Recent Labs Lab 08/15/16 1903 08/16/16 0526  AST 68* 28  ALT 46 32  ALKPHOS 41 38  BILITOT 2.4* 1.9*  PROT 7.6 7.0  ALBUMIN 4.4 3.9    Recent Labs Lab 08/14/16 2358 08/15/16 1903  LIPASE 86* 200*   No results for input(s): AMMONIA in the last 168 hours. Coagulation Profile: No results for input(s): INR, PROTIME in the last 168 hours. Cardiac Enzymes:  Recent Labs Lab 08/15/16 1903 08/15/16 2243 08/16/16 0526  CKTOTAL 662*  --  453*  TROPONINI  --  <0.03  --    BNP (last 3 results) No results for input(s): PROBNP in the last 8760 hours. HbA1C: No results for input(s): HGBA1C in the last 72 hours. CBG:  Recent Labs Lab 08/16/16 0015 08/16/16 0758  GLUCAP 112* 104*   Lipid  Profile:  Recent Labs  08/15/16 2245  CHOL 306*  HDL 20*  LDLCALC UNABLE TO CALCULATE IF TRIGLYCERIDE OVER 400 mg/dL  TRIG 9,5281,130*  CHOLHDL 41.315.3   Thyroid Function Tests: No results for input(s): TSH, T4TOTAL, FREET4, T3FREE, THYROIDAB in the last 72 hours. Anemia Panel: No results for input(s): VITAMINB12, FOLATE, FERRITIN, TIBC, IRON, RETICCTPCT in the last 72 hours. Sepsis Labs:  Recent Labs Lab 08/15/16 1916  LATICACIDVEN 1.34    No results found for this or any previous visit (from the past 240 hour(s)).       Radiology Studies: Dg Chest 2 View  Result Date: 08/15/2016 CLINICAL DATA:  50 year old male with chest pain EXAM: CHEST  2 VIEW COMPARISON:  None. FINDINGS: Two views of the chest demonstrate clear lungs. There is no pleural effusion or pneumothorax. The cardiac silhouette is within normal limits. No acute osseous pathology. IMPRESSION: No active cardiopulmonary disease. Electronically Signed   By: Elgie CollardArash  Radparvar M.D.   On: 08/15/2016 01:47        Scheduled Meds: . chlorhexidine  15 mL Mouth Rinse BID  . [START ON 08/17/2016] Influenza vac split quadrivalent PF  0.5 mL Intramuscular Tomorrow-1000  . mouth rinse  15 mL Mouth Rinse q12n4p  . [START ON 08/17/2016] pneumococcal 23 valent vaccine  0.5 mL Intramuscular Tomorrow-1000   Continuous Infusions: . sodium chloride 150 mL/hr at 08/16/16 0647     LOS: 0 days    Time spent: 25 minutes.     Kathlen ModyAKULA,Yehia Mcbain, MD [facility] Pager [phone]  If 7PM-7AM, please contact night-coverage [email or web address] Password [email or web address] 08/16/2016, 1:58 PM

## 2016-08-17 DIAGNOSIS — K858 Other acute pancreatitis without necrosis or infection: Secondary | ICD-10-CM

## 2016-08-17 LAB — GLUCOSE, CAPILLARY
GLUCOSE-CAPILLARY: 110 mg/dL — AB (ref 65–99)
GLUCOSE-CAPILLARY: 95 mg/dL (ref 65–99)

## 2016-08-17 LAB — LIPASE, BLOOD: Lipase: 43 U/L (ref 11–51)

## 2016-08-17 MED ORDER — FENOFIBRATE 145 MG PO TABS: 145.0000 mg | ORAL_TABLET | Freq: Every day | ORAL | 0 refills | Status: DC

## 2016-08-17 NOTE — Care Management Note (Signed)
Case Management Note  Patient Details  Name: Jordan [name] MRN: [id number] Date of Birth: [birth year]  Subjective/Objective:  50 y/o m admitted w/Acute pancreatitis. From home.No insurance. Patient works. Provided w/pcp listing, health insurance info, [employer] med list. Patient voiced understanding.No further CM needs.                  Action/Plan:d/c home.   Expected Discharge Date:                  Expected Discharge Plan:  Home/Self Care  In-House Referral:     Discharge planning Services  CM Consult  Post Acute Care Choice:    Choice offered to:     DME Arranged:    DME Agency:     HH Arranged:    HH Agency:     Status of Service:  Completed, signed off  If discussed at MicrosoftLong Length of Stay Meetings, dates discussed:    Additional Comments:  Lanier ClamMahabir, Jassiel Flye, RN 08/17/2016, 11:02 AM

## 2016-08-21 NOTE — Discharge Summary (Addendum)
Physician Discharge Summary  Jordan [name] ZOX:[id number] DOB: [birth year] DOA: 08/15/2016  PCP: Farris Has, MD  Admit date: 08/15/2016 Discharge date: 08/17/2016  Admitted From: [facility].  Disposition: Home.   Recommendations for Outpatient Follow-up:  1. Follow up with PCP in 1-2 weeks 2. Please obtain BMP/CBC in one week 3. Please follow up with gastroenterology in one week.     Discharge Condition:stable.  CODE STATUS:full code.  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 Marland Kitchen He was admitted for acute pancreatitis.   Discharge Diagnoses:  Principal Problem:   Acute pancreatitis Active Problems:   Tobacco use   Hypertriglyceridemia   Elevated blood pressure   Pancreatitis from hypertriglyceridemia; Initially he was put NPO, IV aggressive hydration and pain control. Today pain has improved, started diet and advance as tolerated.  Lipase normalized. Elevated lipids started on anti lipid medications and recommended to check liver function panel in one week and follow up with gastroenterology in on eweek.    Prolonged QT INTERVAL: Improved, asymptomatic.    Mild rhabdomyolysis: Improving. Repeat CK levels in am show improvement.    Discharge Instructions  Discharge Instructions    Diet - low sodium heart healthy    Complete by:  As directed    Discharge instructions    Complete by:  As directed    Please follow up with Dr Dulce Sellar in 2 weeks and get liver function tests done, we have started you on a new medication to lower triglycerides. Strict low fat diet.       Medication List    STOP taking these medications   NAUSEA CONTROL PO   Oxycodone HCl 10 MG Tabs     TAKE these medications   fenofibrate 145 MG  tablet Commonly known as:  TRICOR Take 1 tablet (145 mg total) by mouth daily.   FISH OIL PO Take 1 capsule by mouth daily.      Follow-up Information    MORROW, Clifton Custard, MD. Schedule an appointment as soon as possible for a visit in 1 week(s).   Specialty:  Family Medicine Contact information: [address] [place] [address] [phone]        Freddy Jaksch, MD. Schedule an appointment as soon as possible for a visit in 2 week(s).   Specialty:  Gastroenterology Contact information: [address] [place] [address] [phone]          Allergies  Allergen Reactions  . Fish Allergy Nausea And Vomiting and Other (See Comments)    Can eat shrimp & scallops...sometimes crab    Consultations:  None    Procedures/Studies: Dg Chest 2 View  Result Date: 08/15/2016 CLINICAL DATA:  50 year old male with chest pain EXAM: CHEST  2 VIEW COMPARISON:  None. FINDINGS: Two views of the chest demonstrate clear lungs. There is no pleural effusion or pneumothorax. The cardiac silhouette is within normal limits. No acute osseous pathology. IMPRESSION: No active cardiopulmonary disease. Electronically Signed   By: Elgie Collard M.D.   On: 08/15/2016 01:47       Subjective: No new complaints.  Discharge Exam: Vitals:   08/16/16 2119 08/17/16 0424  BP: (!) 136/92 140/88  Pulse: 66 66  Resp: 18 18  Temp: 98.2 F (36.8 C) 99.3 F (37.4 C)   Vitals:  08/16/16 1316 08/16/16 2119 08/17/16 0424 08/17/16 0516  BP: (!) 144/85 (!) 136/92 140/88   Pulse: 71 66 66   Resp: 20 18 18    Temp: 98.6 F (37 C) 98.2 F (36.8 C) 99.3 F (37.4 C)   TempSrc: Oral Oral Oral   SpO2: 99% 97% 100%   Weight:    85.1 kg (187 lb 9.6 oz)  Height:        General: Pt is alert, awake, not in acute distress Cardiovascular: RRR, S1/S2 +, no rubs, no gallops Respiratory: CTA bilaterally, no wheezing, no rhonchi Abdominal: Soft, NT, ND, bowel  sounds + Extremities: no edema, no cyanosis    The results of significant diagnostics from this hospitalization (including imaging, microbiology, ancillary and laboratory) are listed below for reference.     Microbiology: No results found for this or any previous visit (from the past 240 hour(s)).   Labs: BNP (last 3 results) No results for input(s): BNP in the last 8760 hours. Basic Metabolic Panel:  Recent Labs Lab 08/14/16 2358 08/15/16 0443 08/15/16 1903 08/15/16 2027 08/15/16 2243 08/16/16 0526  NA 129*  --  129* 135  --  135  K 6.6* 4.9 5.2* 3.8  --  4.1  CL 101  --  95* 98*  --  102  CO2 19*  --  24  --   --  23  GLUCOSE 136*  --  121* 107*  --  97  BUN 6  --  8 7  --  10  CREATININE 0.55*  --  1.07 1.00  --  0.91  CALCIUM 9.1  --  9.2  --   --  8.8*  MG  --   --   --   --  1.9  --    Liver Function Tests:  Recent Labs Lab 08/15/16 1903 08/16/16 0526  AST 68* 28  ALT 46 32  ALKPHOS 41 38  BILITOT 2.4* 1.9*  PROT 7.6 7.0  ALBUMIN 4.4 3.9    Recent Labs Lab 08/14/16 2358 08/15/16 1903 08/16/16 0526 08/17/16 0541  LIPASE 86* 200* 82* 43   No results for input(s): AMMONIA in the last 168 hours. CBC:  Recent Labs Lab 08/14/16 2358 08/15/16 1903 08/15/16 2027 08/16/16 0526  WBC 11.5* 12.2*  --  9.4  NEUTROABS  --  8.9*  --   --   HGB 15.2 16.1 15.6 13.9  HCT 42.6 43.9 46.0 40.8  MCV 79.0 76.2*  --  79.4  PLT 318 282  --  240   Cardiac Enzymes:  Recent Labs Lab 08/15/16 1903 08/15/16 2243 08/16/16 0526  CKTOTAL 662*  --  453*  TROPONINI  --  <0.03  --    BNP: Invalid input(s): POCBNP CBG:  Recent Labs Lab 08/16/16 0015 08/16/16 0758 08/16/16 1603 08/17/16 0008 08/17/16 0752  GLUCAP 112* 104* 81 95 110*   D-Dimer No results for input(s): DDIMER in the last 72 hours. Hgb A1c No results for input(s): HGBA1C in the last 72 hours. Lipid Profile No results for input(s): CHOL, HDL, LDLCALC, TRIG, CHOLHDL, LDLDIRECT in the  last 72 hours. Thyroid function studies No results for input(s): TSH, T4TOTAL, T3FREE, THYROIDAB in the last 72 hours.  Invalid input(s): FREET3 Anemia work up No results for input(s): VITAMINB12, FOLATE, FERRITIN, TIBC, IRON, RETICCTPCT in the last 72 hours. Urinalysis    Component Value Date/Time   COLORURINE YELLOW 08/15/2016 1841   APPEARANCEUR CLOUDY (A) 08/15/2016 1841   LABSPEC 1.019 08/15/2016 1841  PHURINE 5.0 08/15/2016 1841   GLUCOSEU NEGATIVE 08/15/2016 1841   HGBUR LARGE (A) 08/15/2016 1841   BILIRUBINUR SMALL (A) 08/15/2016 1841   KETONESUR 15 (A) 08/15/2016 1841   PROTEINUR 100 (A) 08/15/2016 1841   UROBILINOGEN 1.0 03/02/2015 1224   NITRITE NEGATIVE 08/15/2016 1841   LEUKOCYTESUR NEGATIVE 08/15/2016 1841   Sepsis Labs Invalid input(s): PROCALCITONIN,  WBC,  LACTICIDVEN Microbiology No results found for this or any previous visit (from the past 240 hour(s)).   Time coordinating discharge: Over 30 minutes  SIGNED:   Kathlen Mody, MD  [facility] 08/21/2016, 9:09 AM Pager   If 7PM-7AM, please contact night-coverage [email or web address] Password [email or web address]

## 2017-01-21 ENCOUNTER — Inpatient Hospital Stay (HOSPITAL_COMMUNITY)
Admission: EM | Admit: 2017-01-21 | Discharge: 2017-01-26 | DRG: 440 | Disposition: A | Discharge status: Home / Self Care | Payer: BLUE CROSS/BLUE SHIELD | Attending: Internal Medicine | Admitting: Internal Medicine

## 2017-01-21 ENCOUNTER — Encounter (HOSPITAL_COMMUNITY): Payer: Self-pay

## 2017-01-21 DIAGNOSIS — Z720 Tobacco use: Secondary | ICD-10-CM | POA: Diagnosis present

## 2017-01-21 DIAGNOSIS — K219 Gastro-esophageal reflux disease without esophagitis: Secondary | ICD-10-CM | POA: Diagnosis present

## 2017-01-21 DIAGNOSIS — R030 Elevated blood-pressure reading, without diagnosis of hypertension: Secondary | ICD-10-CM | POA: Diagnosis not present

## 2017-01-21 DIAGNOSIS — R509 Fever, unspecified: Secondary | ICD-10-CM

## 2017-01-21 DIAGNOSIS — E781 Pure hyperglyceridemia: Secondary | ICD-10-CM | POA: Diagnosis present

## 2017-01-21 DIAGNOSIS — K8500 Idiopathic acute pancreatitis without necrosis or infection: Secondary | ICD-10-CM | POA: Diagnosis not present

## 2017-01-21 DIAGNOSIS — F17210 Nicotine dependence, cigarettes, uncomplicated: Secondary | ICD-10-CM | POA: Diagnosis present

## 2017-01-21 DIAGNOSIS — Z66 Do not resuscitate: Secondary | ICD-10-CM | POA: Diagnosis present

## 2017-01-21 DIAGNOSIS — M25562 Pain in left knee: Secondary | ICD-10-CM | POA: Diagnosis not present

## 2017-01-21 DIAGNOSIS — E875 Hyperkalemia: Secondary | ICD-10-CM | POA: Diagnosis not present

## 2017-01-21 DIAGNOSIS — I10 Essential (primary) hypertension: Secondary | ICD-10-CM | POA: Diagnosis present

## 2017-01-21 DIAGNOSIS — G8929 Other chronic pain: Secondary | ICD-10-CM | POA: Diagnosis not present

## 2017-01-21 DIAGNOSIS — F17200 Nicotine dependence, unspecified, uncomplicated: Secondary | ICD-10-CM | POA: Diagnosis not present

## 2017-01-21 DIAGNOSIS — K861 Other chronic pancreatitis: Secondary | ICD-10-CM | POA: Diagnosis present

## 2017-01-21 DIAGNOSIS — K858 Other acute pancreatitis without necrosis or infection: Secondary | ICD-10-CM | POA: Diagnosis present

## 2017-01-21 DIAGNOSIS — K2980 Duodenitis without bleeding: Secondary | ICD-10-CM | POA: Diagnosis present

## 2017-01-21 DIAGNOSIS — E785 Hyperlipidemia, unspecified: Secondary | ICD-10-CM | POA: Diagnosis present

## 2017-01-21 DIAGNOSIS — R109 Unspecified abdominal pain: Secondary | ICD-10-CM

## 2017-01-21 DIAGNOSIS — E876 Hypokalemia: Secondary | ICD-10-CM | POA: Diagnosis not present

## 2017-01-21 DIAGNOSIS — K859 Acute pancreatitis without necrosis or infection, unspecified: Secondary | ICD-10-CM

## 2017-01-21 LAB — COMPREHENSIVE METABOLIC PANEL
ALK PHOS: 38 U/L (ref 38–126)
Albumin: 4.2 g/dL (ref 3.5–5.0)
Anion gap: 14 (ref 5–15)
BUN: 14 mg/dL (ref 6–20)
CALCIUM: 9.6 mg/dL (ref 8.9–10.3)
CHLORIDE: 103 mmol/L (ref 101–111)
CO2: 17 mmol/L — AB (ref 22–32)
CREATININE: 0.96 mg/dL (ref 0.61–1.24)
GFR calc Af Amer: >60 mL/min (ref >60)
GFR calc non Af Amer: >60 mL/min (ref >60)
GLUCOSE: 120 mg/dL — AB (ref 65–99)
Potassium: 6.8 mmol/L (ref 3.5–5.1)
SODIUM: 134 mmol/L — AB (ref 135–145)
Total Bilirubin: 4.2 mg/dL — ABNORMAL HIGH (ref 0.3–1.2)

## 2017-01-21 LAB — URINALYSIS, ROUTINE W REFLEX MICROSCOPIC
Bacteria, UA: NONE SEEN
Bilirubin Urine: NEGATIVE
Glucose, UA: NEGATIVE mg/dL
KETONES UR: NEGATIVE mg/dL
Leukocytes, UA: NEGATIVE
Nitrite: NEGATIVE
PH: 5 (ref 5.0–8.0)
Protein, ur: 30 mg/dL — AB
Specific Gravity, Urine: 1.021 (ref 1.005–1.030)

## 2017-01-21 LAB — CBC
HCT: 38.3 % — ABNORMAL LOW (ref 39.0–52.0)
Hemoglobin: 12.9 g/dL — ABNORMAL LOW (ref 13.0–17.0)
MCH: 27.5 pg (ref 26.0–34.0)
MCHC: 33.7 g/dL (ref 30.0–36.0)
MCV: 81.7 fL (ref 78.0–100.0)
PLATELETS: 347 10*3/uL (ref 150–400)
RBC: 4.69 MIL/uL (ref 4.22–5.81)
RDW: 16.3 % — AB (ref 11.5–15.5)
WBC: 7.9 10*3/uL (ref 4.0–10.5)

## 2017-01-21 LAB — POTASSIUM

## 2017-01-21 LAB — LIPASE, BLOOD: LIPASE: 347 U/L — AB (ref 11–51)

## 2017-01-21 LAB — TRIGLYCERIDES: Triglycerides: 4891 mg/dL — ABNORMAL HIGH (ref <150)

## 2017-01-21 MED ORDER — SODIUM CHLORIDE 0.9 % IV BOLUS (SEPSIS)
1000.0000 mL | Freq: Once | INTRAVENOUS | Status: AC
  Administered 2017-01-21: 1000 mL via INTRAVENOUS

## 2017-01-21 MED ORDER — SODIUM CHLORIDE 0.9 % IV BOLUS (SEPSIS): 1000.0000 mL | Freq: Once | INTRAVENOUS | Status: DC

## 2017-01-21 MED ORDER — SODIUM CHLORIDE 0.9 % IV SOLN
INTRAVENOUS | Status: DC
  Administered 2017-01-21 – 2017-01-22 (×4): via INTRAVENOUS

## 2017-01-21 MED ORDER — HYDROMORPHONE HCL 2 MG/ML IJ SOLN
1.0000 mg | Freq: Once | INTRAMUSCULAR | Status: AC
  Administered 2017-01-21: 1 mg via INTRAVENOUS
  Filled 2017-01-21: qty 1

## 2017-01-21 MED ORDER — NICOTINE 21 MG/24HR TD PT24
21.0000 mg | MEDICATED_PATCH | Freq: Every day | TRANSDERMAL | Status: DC
  Administered 2017-01-22 – 2017-01-26 (×6): 21 mg via TRANSDERMAL
  Filled 2017-01-21 (×6): qty 1

## 2017-01-21 MED ORDER — ONDANSETRON 4 MG PO TBDP
4.0000 mg | ORAL_TABLET | Freq: Once | ORAL | Status: AC | PRN
  Administered 2017-01-21: 4 mg via ORAL

## 2017-01-21 MED ORDER — ONDANSETRON 4 MG PO TBDP
ORAL_TABLET | ORAL | Status: AC
  Filled 2017-01-21: qty 1

## 2017-01-21 MED ORDER — ONDANSETRON HCL 4 MG/2ML IJ SOLN
4.0000 mg | Freq: Once | INTRAMUSCULAR | Status: AC
  Administered 2017-01-21: 4 mg via INTRAVENOUS
  Filled 2017-01-21: qty 2

## 2017-01-21 NOTE — ED Notes (Signed)
Lab called. Reported critical potassium due to hemolysis. Stated that this is common w/ pt. Did not request another sample because it would most likely yield th same result.

## 2017-01-21 NOTE — H&P (Addendum)
History and Physical    Jordan [name] ZOX:[id number] DOB: 24-May-1966 DOA: 01/21/2017  PCP: No PCP Per Patient Consultants:  None Patient coming from: home - lives with wife; NOK: wife, [phone]  Chief Complaint: abdominal pain  HPI: Jordan [name] is a 51 y.o. 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  He developed abdominal pain on Thursday and then it improved.  Today, it recurred and was severe.  Severe LUQ pain, radiates into the back.  N/V, maybe 5+ times today.  Last emesis was about 7:30-8/  No diarrhea.    He has had this problem several times before.  The first time, he saw many doctors and no one could figure out why his TG were so high.  When he moved to [facility], he saw the belly specialists right across the street from here and no one could figure out how to get the TG down.  This is his 3rd-4th admission for this.  He has been told it is hereditary but he does not know anyone else in the family who has it.  He has never had phasmaphoresis.  Dietary changes: He is now off beef, only eats [place] and chicken (but sneaks an occasional hamburger). Meds: gemfibrozil - developed liver problem Has not tried niacin Takes fish oil currently Never been on statin.  Prior ER visits/admissions in the last year: -06/25/16 - ER visit for pancreatitis -07/16/16  - ER visit for abdominal pain, diagnosed with atypical CP, abnormal LFTs, and GERD - treated with Oxycodone -08/15/16 - ER visit for pancreatitis -9/26-28/17 - hospitalization for pancreatitis; outpatient referral to [facility], Dr. Dulce Sellar   ED Course: Hypertriglyceridemia associated pancreatitis, treated with 2L IVF, Zofran, Dilaudid  Review of Systems: As per HPI; otherwise 10 point review of systems reviewed and negative.   Ambulatory Status:  Having difficulty walking on left leg, foot goes numb for a while, having trouble walking  Past Medical  History:  Diagnosis Date  . Arthritis    right elbow pain all the time, took cortisone shot for  . GERD (gastroesophageal reflux disease)   . Pancreatitis 12-29-2013    Past Surgical History:  Procedure Laterality Date  . EUS N/A 08/12/2014   Procedure: ESOPHAGEAL ENDOSCOPIC ULTRASOUND (EUS) RADIAL;  Surgeon: Willis Modena, MD;  Location: [facility];  Service: Endoscopy;  Laterality: N/A;  . FINE NEEDLE ASPIRATION N/A 08/12/2014   Procedure: FINE NEEDLE ASPIRATION (FNA) LINEAR;  Surgeon: Willis Modena, MD;  Location: [facility];  Service: Endoscopy;  Laterality: N/A;  . NO PAST SURGERIES      Social History   Social History  . Marital status: Married    Spouse name: N/A  . Number of children: N/A  . Years of education: N/A   Occupational History  . Not on file.   Social History Main Topics  . Smoking status: Current Every Day Smoker    Packs/day: 1.00    Years: 38.00    Types: Cigarettes  . Smokeless tobacco: Never Used  . Alcohol use Yes     Comment: little to no  . Drug use: No  . Sexual activity: Not on file   Other Topics Concern  . Not on file   Social History Narrative  . No narrative on file    Allergies  Allergen Reactions  . Fish Allergy Nausea And Vomiting and Other (See Comments)    Can eat shrimp & scallops...sometimes crab    Family History  Problem Relation Age of Onset  . Other Mother     Healthy  . Other Father     Healthy  . Pancreatitis Neg Hx     Prior to Admission medications   Medication Sig Start Date End Date Taking? Authorizing Provider  ibuprofen (ADVIL,MOTRIN) 200 MG tablet Take 400 mg by mouth every 6 (six) hours as needed.   Yes Historical Provider, MD  Omega-3 Fatty Acids (FISH OIL PO) Take 1 capsule by mouth daily.    Yes Historical Provider, MD  RaNITidine HCl (ACID REDUCER PO) Take 1 capsule by mouth daily.   Yes Historical Provider, MD    Physical Exam: Vitals:   01/21/17 2030 01/21/17 2115 01/21/17 2145 01/21/17  2245  BP: 180/95 166/93 153/92 150/96  Pulse: 72 69 69 74  Resp: (!) 27 18 21 18   Temp:      TempSrc:      SpO2: 98% 99% 98% 95%     General: Appears calm and comfortable and is NAD Eyes:  PERRL, EOMI, normal lids, iris ENT:  grossly normal hearing, lips & tongue, mmm Neck:  no LAD, masses or thyromegaly Cardiovascular:  RRR, no m/r/g. No LE edema.  Respiratory:  CTA bilaterally, no w/r/r. Normal respiratory effort. Abdomen:  soft, nd, diffuse abdominal TTP which is worst in LUQ, scant bowel sounds Skin:  no rash or induration seen on limited exam Musculoskeletal:  grossly normal tone BUE/BLE, good ROM, no bony abnormality Psychiatric:  grossly normal mood and affect, speech fluent and appropriate, AOx3 Neurologic:  CN 2-12 grossly intact, moves all extremities in coordinated fashion, sensation intact  Labs on Admission: I have personally reviewed following labs and imaging studies  CBC:  Recent Labs Lab 01/21/17 1916  WBC 7.9  HGB 12.9*  HCT 38.3*  MCV 81.7  PLT 347   Basic Metabolic Panel:  Recent Labs Lab 01/21/17 1916 01/21/17 2125 01/21/17 2214  NA 134*  --   --   K 6.8* >7.5* >7.5*  CL 103  --   --   CO2 17*  --   --   GLUCOSE 120*  --   --   BUN 14  --   --   CREATININE 0.96  --   --   CALCIUM 9.6  --   --    GFR: CrCl cannot be calculated (Unknown ideal weight.). Liver Function Tests:  Recent Labs Lab 01/21/17 1916  AST RESULTS UNAVAILABLE DUE TO INTERFERING SUBSTANCE  ALT RESULTS UNAVAILABLE DUE TO INTERFERING SUBSTANCE  ALKPHOS 38  BILITOT 4.2*  PROT RESULTS UNAVAILABLE DUE TO INTERFERING SUBSTANCE  ALBUMIN 4.2    Recent Labs Lab 01/21/17 1916  LIPASE 347*   No results for input(s): AMMONIA in the last 168 hours. Coagulation Profile: No results for input(s): INR, PROTIME in the last 168 hours. Cardiac Enzymes: No results for input(s): CKTOTAL, CKMB, CKMBINDEX, TROPONINI in the last 168 hours. BNP (last 3 results) No results for  input(s): PROBNP in the last 8760 hours. HbA1C: No results for input(s): HGBA1C in the last 72 hours. CBG: No results for input(s): GLUCAP in the last 168 hours. Lipid Profile:  Recent Labs  01/21/17 2020  TRIG 4,891*   Thyroid Function Tests: No results for input(s): TSH, T4TOTAL, FREET4, T3FREE, THYROIDAB in the last 72 hours. Anemia Panel: No results for input(s): VITAMINB12, FOLATE, FERRITIN, TIBC, IRON, RETICCTPCT in the last 72 hours. Urine analysis:    Component Value Date/Time   COLORURINE YELLOW 01/21/2017 1916   APPEARANCEUR  CLEAR 01/21/2017 1916   LABSPEC 1.021 01/21/2017 1916   PHURINE 5.0 01/21/2017 1916   GLUCOSEU NEGATIVE 01/21/2017 1916   HGBUR MODERATE (A) 01/21/2017 1916   BILIRUBINUR NEGATIVE 01/21/2017 1916   KETONESUR NEGATIVE 01/21/2017 1916   PROTEINUR 30 (A) 01/21/2017 1916   UROBILINOGEN 1.0 03/02/2015 1224   NITRITE NEGATIVE 01/21/2017 1916   LEUKOCYTESUR NEGATIVE 01/21/2017 1916    Creatinine Clearance: CrCl cannot be calculated (Unknown ideal weight.).  Sepsis Labs: @LABRCNTIP (procalcitonin:4,lacticidven:4) )No results found for this or any previous visit (from the past 240 hour(s)).   Radiological Exams on Admission: No results found.  EKG: Independently reviewed.  NSR with rate 62; nonspecific ST changes with no evidence of acute ischemia, no peaked t waves  Assessment/Plan Principal Problem:   Acute pancreatitis Active Problems:   Tobacco use   Hypertriglyceridemia   Elevated blood pressure reading with diagnosis of hypertension   Left knee pain   Pancreatitis resulting from very severe hypertriglyceridemia -Now with frank pancreatitis by H&P, elevated lipase -Unable to calculate LFTs since triglycerides are so very elevated (blood is incredibly viscous) -Will admit to Med Surg -Strict NPO for now -Aggressive IVF hydration at least for the first 12 hours with NS at 200 cc/hr -Pain control with morphine 2 mg q2h prn and Toradol  30 mg q6h prn -Nausea control with Zofran -[facility] previously saw the patient as an outpatient and records are not available; suggest re-consultation in the AM -K >7.5; this is likely pseudohyperkalemia resulting from the viscosity of his blood and inability to effectively run the test -Glucose 120 -Lipase 347 -TG 4891, prior 1130 on 08/15/16 -Based on this extremely elevated triglyceride level, 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  This is generally an autosomal dominant condition, which is unusual since he is not aware of anyone else in his family with the condition.  He has children and would likely benefit from genetic counseling as an outpatient so that he will be aware of what is potentially in store for himself and his children.   -Treatment of this condition is generally with fibrates, niacin, n-3 fatty acids, plus/minus statins.  He was unable to tolerate gemfibrozil in the past and reports having taken other medications.   -Total cholesterol is made up of the HDL, LDL, and TG/5.  With his current triglyceride level, his total cholesterol must be approximately 1000! He is likely to benefit from combination therapy with at least statin therapy in addition to one other medication.   -Because of his very severe triglycerides, he may benefit from plasmaphoresis with a therapeutic plasma exchange.  This is generally recommended early in treatment, within approximately 48 hours of presentation.  If he has a quick and spontaneous recovery, this would not necessarily need to be considered.  However, given the severity of disease, if he is not improved in the next 12-24 hours, this should likely be considered.  Of note, his wife is TEFL teacher[employer]ehov[employer]'s Witness and will not accept blood products; he goes with her to church occasionally and so would have to think hard about it but does think he would accept blood if needed.   -He would benefit from a dietary consult prior to discharge.  If he is  willing, he may benefit from a strict low-fat, low-sugar diet.  Tobacco dependence -Encourage cessation.  This was discussed with the patient and should be reviewed on an ongoing basis.   -Patch ordered at patient request.  Elevated BP -He has had this diagnosis  more than once now and so likely does qualify for HTN. -However, he was still having pain at the time of my evaluation and so the current BP may not be accurate. -If ongoing BP elevation once he is clinically improved, consider treatment of HTN.  Left knee pain -PT consult while here.  Other -Patient does not have a PCP and clearly needs one due to this chronic disease.  Will request case management consultation.   DVT prophylaxis:  Lovenox Code Status: DNR - confirmed with patient/family Family Communication: Wife present throughout evaluation Disposition Plan:  Home once clinically improved Consults called: PT, CM; suggest GI in AM and nutrition once eating  Admission status: Admit - It is my clinical opinion that admission to INPATIENT is reasonable and necessary because this patient will require at least 2 midnights in the hospital to treat this condition based on the medical complexity of the problems presented.  Given the aforementioned information, the predictability of an adverse outcome is felt to be significant.  Note: There are no Med Surg beds available at [facility] and so the patient has agreed to transfer to [facility] for admission instead.  Jonah Blue MD [facility]  If 7PM-7AM, please contact night-coverage [email or web address] Password [facility]  01/21/2017, 11:57 PM

## 2017-01-21 NOTE — ED Provider Notes (Deleted)
I saw and evaluated the patient, reviewed the resident's note and I agree with the findings and plan.   EKG Interpretation None      Patient here with abdominal pain in the left upper quadrant nausea and vomiting. Patient has a history of elevated triglycerides causing pancreatitis. He denies any fever, diarrhea and states this feels similar to his prior attack.  Patient has normal vital signs. On exam patient appears uncomfortable and in pain. He has significant pain in the left upper quadrant with mild guarding. Heart regular rate and rhythm, lungs good auscultation bilaterally. Labs are consistent with elevated triglycerides of 4800, elevated lipase of 350. Patient given 2 L of IV fluid and pain control. Patient will be admitted for further care.   Gwyneth SproutWhitney Andrell Tallman, MD 01/21/17 [phone]

## 2017-01-21 NOTE — ED Notes (Signed)
Spoke wit Dr. Anitra LauthPlunkett regarding hemolyzed Potassium.

## 2017-01-21 NOTE — ED Notes (Signed)
Yates, MD at bedside 

## 2017-01-21 NOTE — ED Triage Notes (Signed)
Pt complaining of pancreatitis. Pt states hx of same. Pt complaining of abdominal pain and emesis x 4 today.

## 2017-01-21 NOTE — ED Notes (Signed)
Plunkett MD at bedside. 

## 2017-01-21 NOTE — ED Notes (Signed)
MD at bedside. 

## 2017-01-21 NOTE — ED Provider Notes (Signed)
[facility]EMER[facility]ENCY DEPT Provider Note   CSN: [id number] Arrival date & time: 01/21/17  4098     History   Chief Complaint Chief Complaint  Patient presents with  . Abdominal Pain  . Emesis    HPI Jordan [name] is a 51 y.o. male.  HPI Pt complaining of pancreatitis. Pt states hx of same. Pt complaining of abdominal pain and emesis x 4 today Emesis of yellow contents States he has had this before and has been told it is related to elevated triglycerides Pain is severe, in middle of abdomen and radiates to back He is taking omega but has been refractory to multiple anti-triglycerides No fevers, chills  Past Medical History:  Diagnosis Date  . Arthritis    right elbow pain all the time, took cortisone shot for  . GERD (gastroesophageal reflux disease)   . Pancreatitis 12-29-2013    Patient Active Problem List   Diagnosis Date Noted  . Left knee pain 01/21/2017  . Acute pancreatitis 08/15/2016  . Elevated blood pressure reading with diagnosis of hypertension 08/15/2016  . Hypertriglyceridemia 12/31/2013  . Abdominal pain 12/29/2013  . Nausea and vomiting 12/29/2013  . Tobacco use 12/29/2013  . Leukocytosis 12/29/2013    Past Surgical History:  Procedure Laterality Date  . EUS N/A 08/12/2014   Procedure: ESOPHAGEAL ENDOSCOPIC ULTRASOUND (EUS) RADIAL;  Surgeon: Willis Modena, MD;  Location: [facility];  Service: Endoscopy;  Laterality: N/A;  . FINE NEEDLE ASPIRATION N/A 08/12/2014   Procedure: FINE NEEDLE ASPIRATION (FNA) LINEAR;  Surgeon: Willis Modena, MD;  Location: [facility];  Service: Endoscopy;  Laterality: N/A;  . NO PAST SURGERIES         Home Medications    Prior to Admission medications   Medication Sig Start Date End Date Taking? Authorizing Provider  ibuprofen (ADVIL,MOTRIN) 200 MG tablet Take 400 mg by mouth every 6 (six) hours as needed.   Yes Historical Provider, MD  Omega-3 Fatty Acids (FISH OIL PO) Take 1 capsule by mouth daily.    Yes Historical  Provider, MD  RaNITidine HCl (ACID REDUCER PO) Take 1 capsule by mouth daily.   Yes Historical Provider, MD    Family History Family History  Problem Relation Age of Onset  . Other Mother     Healthy  . Other Father     Healthy  . Pancreatitis Neg Hx     Social History Social History  Substance Use Topics  . Smoking status: Current Every Day Smoker    Packs/day: 1.00    Years: 38.00    Types: Cigarettes  . Smokeless tobacco: Never Used  . Alcohol use Yes     Comment: little to no     Allergies   Fish allergy   Review of Systems Review of Systems  Constitutional: Negative for fever.  Gastrointestinal: Negative for abdominal pain and blood in stool.       Neg hematemesis  Allergic/Immunologic: Negative for immunocompromised state.  Neurological: Negative for syncope.  All other systems reviewed and are negative.    Physical Exam Updated Vital Signs BP (!) 161/113   Pulse 61   Temp 98.4 F (36.9 C) (Oral)   Resp 18   SpO2 99%   Physical Exam  Constitutional: He appears well-developed and well-nourished. No distress.  HENT:  Head: Normocephalic and atraumatic.  Left Ear: External ear normal.  Eyes: Conjunctivae are normal. Pupils are equal, round, and reactive to light. Right eye exhibits no discharge. Left eye exhibits no discharge.  Neck:  Normal range of motion. Neck supple.  Cardiovascular: Normal rate and regular rhythm.   No murmur heard. Pulmonary/Chest: Effort normal and breath sounds normal. No respiratory distress.  Abdominal: Soft. Bowel sounds are normal. He exhibits no distension and no mass. There is tenderness (generalized, epgastric). There is no rebound and no guarding.  Musculoskeletal: He exhibits no edema.  Neurological: He is alert.  Skin: Skin is warm. He is not diaphoretic.  Psychiatric: He has a normal mood and affect.     ED Treatments / Results  Labs (all labs ordered are listed, but only abnormal results are displayed) Labs  Reviewed  LIPASE, BLOOD - Abnormal; Notable for the following:       Result Value   Lipase 347 (*)    All other components within normal limits  COMPREHENSIVE METABOLIC PANEL - Abnormal; Notable for the following:    Sodium 134 (*)    Potassium 6.8 (*)    CO2 17 (*)    Glucose, Bld 120 (*)    Total Bilirubin 4.2 (*)    All other components within normal limits  CBC - Abnormal; Notable for the following:    Hemoglobin 12.9 (*)    HCT 38.3 (*)    RDW 16.3 (*)    All other components within normal limits  URINALYSIS, ROUTINE W REFLEX MICROSCOPIC - Abnormal; Notable for the following:    Hgb urine dipstick MODERATE (*)    Protein, ur 30 (*)    Squamous Epithelial / LPF 0-5 (*)    All other components within normal limits  TRIGLYCERIDES - Abnormal; Notable for the following:    Triglycerides 4,891 (*)    All other components within normal limits  POTASSIUM - Abnormal; Notable for the following:    Potassium >7.5 (*)    All other components within normal limits  POTASSIUM - Abnormal; Notable for the following:    Potassium >7.5 (*)    All other components within normal limits    EKG  EKG Interpretation  Date/Time:  Sunday January 21 2017 22:06:17 EST Ventricular Rate:  62 PR Interval:    QRS Duration: 82 QT Interval:  410 QTC Calculation: 417 R Axis:   70 Text Interpretation:  Sinus rhythm Probable left atrial enlargement Probable left ventricular hypertrophy Borderline T abnormalities, inferior leads Minimal ST elevation, inferior leads When compared with ECG of 08/17/2016, QT has shortened Nonspecific ST abnormality is no longer present Confirmed by [facility]  MD, DAVID ([id number]) on 01/21/2017 11:54:14 PM       Radiology No results found.  Procedures Procedures (including critical care time)  Medications Ordered in ED Medications  ondansetron (ZOFRAN-ODT) 4 MG disintegrating tablet (not administered)  0.9 %  sodium chloride infusion ( Intravenous New Bag/Given 01/21/17 2245)    nicotine (NICODERM CQ - dosed in mg/24 hours) patch 21 mg (not administered)  ondansetron (ZOFRAN-ODT) disintegrating tablet 4 mg (4 mg Oral Given 01/21/17 1917)  HYDROmorphone (DILAUDID) injection 1 mg (1 mg Intravenous Given 01/21/17 2023)  sodium chloride 0.9 % bolus 1,000 mL (0 mLs Intravenous Stopped 01/21/17 2245)  ondansetron (ZOFRAN) injection 4 mg (4 mg Intravenous Given 01/21/17 2023)  HYDROmorphone (DILAUDID) injection 1 mg (1 mg Intravenous Given 01/21/17 2154)  sodium chloride 0.9 % bolus 1,000 mL (0 mLs Intravenous Stopped 01/21/17 2245)     Initial Impression / Assessment and Plan / ED Course  I have reviewed the triage vital signs and the nursing notes.  Pertinent labs & imaging results that were available  during my care of the patient were reviewed by me and considered in my medical decision making (see chart for details).     Pancreatitis secondary to triglycerides almost of 5k Abdomen soft, no significant guarding - AF, VSS - do not suspect complication/abscess formation Fluids, analgesia; labs reviewed - K elevated, but likely secondary to triglycerides, EKG wo evidence of hyperK changes Admit, likely needs exchange transfusion for elevated lipids given refraction to home regimen, previous regimens but will defer to admitting team   Final Clinical Impressions(s) / ED Diagnoses   Final diagnoses:  Other acute pancreatitis, unspecified complication status  Hypertriglyceridemia    New Prescriptions New Prescriptions   No medications on file     Sidney Ace, MD 01/22/17 1610    Gwyneth Sprout, MD 01/22/17 0009

## 2017-01-22 ENCOUNTER — Inpatient Hospital Stay (HOSPITAL_COMMUNITY): Payer: BLUE CROSS/BLUE SHIELD

## 2017-01-22 ENCOUNTER — Encounter (HOSPITAL_COMMUNITY): Payer: Self-pay | Admitting: *Deleted

## 2017-01-22 DIAGNOSIS — K859 Acute pancreatitis without necrosis or infection, unspecified: Secondary | ICD-10-CM

## 2017-01-22 DIAGNOSIS — F17200 Nicotine dependence, unspecified, uncomplicated: Secondary | ICD-10-CM

## 2017-01-22 DIAGNOSIS — E875 Hyperkalemia: Secondary | ICD-10-CM

## 2017-01-22 LAB — CBC
HCT: 43.2 % (ref 39.0–52.0)
Hemoglobin: 15.7 g/dL (ref 13.0–17.0)
MCH: 28.9 pg (ref 26.0–34.0)
MCHC: 36.3 g/dL — ABNORMAL HIGH (ref 30.0–36.0)
MCV: 79.4 fL (ref 78.0–100.0)
PLATELETS: 356 10*3/uL (ref 150–400)
RBC: 5.44 MIL/uL (ref 4.22–5.81)
RDW: 15.6 % — ABNORMAL HIGH (ref 11.5–15.5)
WBC: 12.2 10*3/uL — AB (ref 4.0–10.5)

## 2017-01-22 LAB — COMPREHENSIVE METABOLIC PANEL
ALBUMIN: 4.7 g/dL (ref 3.5–5.0)
ALK PHOS: 37 U/L — AB (ref 38–126)
ALT: 48 U/L (ref 17–63)
AST: 66 U/L — ABNORMAL HIGH (ref 15–41)
Anion gap: 9 (ref 5–15)
BUN: 8 mg/dL (ref 6–20)
CALCIUM: 8.8 mg/dL — AB (ref 8.9–10.3)
CO2: 21 mmol/L — AB (ref 22–32)
CREATININE: 1.3 mg/dL — AB (ref 0.61–1.24)
Chloride: 99 mmol/L — ABNORMAL LOW (ref 101–111)
GFR calc non Af Amer: >60 mL/min (ref >60)
Glucose, Bld: 120 mg/dL — ABNORMAL HIGH (ref 65–99)
Potassium: 5.8 mmol/L — ABNORMAL HIGH (ref 3.5–5.1)
SODIUM: 129 mmol/L — AB (ref 135–145)
Total Bilirubin: 3.1 mg/dL — ABNORMAL HIGH (ref 0.3–1.2)
Total Protein: 6.8 g/dL (ref 6.5–8.1)

## 2017-01-22 LAB — RAPID URINE DRUG SCREEN, HOSP PERFORMED
Amphetamines: NOT DETECTED
BARBITURATES: NOT DETECTED
Benzodiazepines: NOT DETECTED
Cocaine: NOT DETECTED
Opiates: POSITIVE — AB
Tetrahydrocannabinol: NOT DETECTED

## 2017-01-22 MED ORDER — ACETAMINOPHEN 325 MG PO TABS: 650.0000 mg | ORAL_TABLET | ORAL | Status: DC | PRN

## 2017-01-22 MED ORDER — KETOROLAC TROMETHAMINE 30 MG/ML IJ SOLN
30.0000 mg | Freq: Four times a day (QID) | INTRAMUSCULAR | Status: DC | PRN
  Administered 2017-01-22 – 2017-01-23 (×4): 30 mg via INTRAVENOUS
  Filled 2017-01-22 (×4): qty 1

## 2017-01-22 MED ORDER — SODIUM BICARBONATE 8.4 % IV SOLN
50.0000 meq | Freq: Once | INTRAVENOUS | Status: AC
  Administered 2017-01-22: 50 meq via INTRAVENOUS
  Filled 2017-01-22: qty 50

## 2017-01-22 MED ORDER — ACD FORMULA A 0.73-2.45-2.2 GM/100ML VI SOLN
500.0000 mL | Status: DC
  Filled 2017-01-22: qty 500

## 2017-01-22 MED ORDER — CALCIUM CARBONATE ANTACID 500 MG PO CHEW: 2.0000 | CHEWABLE_TABLET | ORAL | Status: AC

## 2017-01-22 MED ORDER — ONDANSETRON HCL 4 MG/2ML IJ SOLN
4.0000 mg | Freq: Four times a day (QID) | INTRAMUSCULAR | Status: DC | PRN
  Administered 2017-01-22 – 2017-01-26 (×13): 4 mg via INTRAVENOUS
  Filled 2017-01-22 (×14): qty 2

## 2017-01-22 MED ORDER — SODIUM CHLORIDE 0.9 % IV SOLN
1.0000 g | Freq: Once | INTRAVENOUS | Status: AC
  Administered 2017-01-22: 1 g via INTRAVENOUS
  Filled 2017-01-22: qty 10

## 2017-01-22 MED ORDER — SODIUM POLYSTYRENE SULFONATE 15 GM/60ML PO SUSP
60.0000 g | Freq: Once | ORAL | Status: AC
  Administered 2017-01-22: 60 g via RECTAL
  Filled 2017-01-22: qty 240

## 2017-01-22 MED ORDER — OMEGA-3-ACID ETHYL ESTERS 1 G PO CAPS
2.0000 g | ORAL_CAPSULE | Freq: Two times a day (BID) | ORAL | Status: DC
  Administered 2017-01-23 – 2017-01-26 (×6): 2 g via ORAL
  Filled 2017-01-22 (×6): qty 2

## 2017-01-22 MED ORDER — MORPHINE SULFATE (PF) 4 MG/ML IV SOLN
2.0000 mg | INTRAVENOUS | Status: DC | PRN
  Administered 2017-01-22 (×5): 2 mg via INTRAVENOUS
  Filled 2017-01-22 (×5): qty 1

## 2017-01-22 MED ORDER — ONDANSETRON HCL 4 MG PO TABS: 4.0000 mg | ORAL_TABLET | Freq: Four times a day (QID) | ORAL | Status: DC | PRN

## 2017-01-22 MED ORDER — SODIUM CHLORIDE 0.9 % IV SOLN
INTRAVENOUS | Status: DC
  Administered 2017-01-22 – 2017-01-23 (×3): via INTRAVENOUS

## 2017-01-22 MED ORDER — DIPHENHYDRAMINE HCL 25 MG PO CAPS: 25.0000 mg | ORAL_CAPSULE | Freq: Four times a day (QID) | ORAL | Status: DC | PRN

## 2017-01-22 MED ORDER — INSULIN ASPART 100 UNIT/ML ~~LOC~~ SOLN: 10.0000 [IU] | Freq: Once | SUBCUTANEOUS | Status: DC

## 2017-01-22 MED ORDER — SODIUM CHLORIDE 0.9 % IV SOLN
4.0000 g | Freq: Once | INTRAVENOUS | Status: DC
  Filled 2017-01-22: qty 40

## 2017-01-22 MED ORDER — SODIUM CHLORIDE 0.9 % IV SOLN
Freq: Once | INTRAVENOUS | Status: DC
  Filled 2017-01-22: qty 200

## 2017-01-22 MED ORDER — DEXTROSE 50 % IV SOLN
1.0000 | Freq: Once | INTRAVENOUS | Status: AC
  Administered 2017-01-22: 50 mL via INTRAVENOUS
  Filled 2017-01-22: qty 50

## 2017-01-22 MED ORDER — ENOXAPARIN SODIUM 40 MG/0.4ML ~~LOC~~ SOLN
40.0000 mg | SUBCUTANEOUS | Status: DC
  Administered 2017-01-22 – 2017-01-26 (×5): 40 mg via SUBCUTANEOUS
  Filled 2017-01-22 (×4): qty 0.4

## 2017-01-22 MED ORDER — ACETAMINOPHEN 650 MG RE SUPP: 650.0000 mg | Freq: Four times a day (QID) | RECTAL | Status: DC | PRN

## 2017-01-22 MED ORDER — HEPARIN SODIUM (PORCINE) 1000 UNIT/ML IJ SOLN: 1000.0000 [IU] | Freq: Once | INTRAMUSCULAR | Status: DC

## 2017-01-22 MED ORDER — FAMOTIDINE 20 MG PO TABS: 20.0000 mg | ORAL_TABLET | Freq: Every day | ORAL | Status: DC

## 2017-01-22 MED ORDER — INSULIN ASPART 100 UNIT/ML IV SOLN
10.0000 [IU] | Freq: Once | INTRAVENOUS | Status: AC
  Administered 2017-01-22: 10 [IU] via INTRAVENOUS
  Filled 2017-01-22: qty 0.1

## 2017-01-22 MED ORDER — ACETAMINOPHEN 325 MG PO TABS: 650.0000 mg | ORAL_TABLET | Freq: Four times a day (QID) | ORAL | Status: DC | PRN

## 2017-01-22 MED ORDER — HYDROMORPHONE HCL 1 MG/ML IJ SOLN
0.5000 mg | INTRAMUSCULAR | Status: DC | PRN
  Administered 2017-01-22 – 2017-01-26 (×20): 0.5 mg via INTRAVENOUS
  Filled 2017-01-22 (×17): qty 1
  Filled 2017-01-22: qty 0.5
  Filled 2017-01-22 (×2): qty 1

## 2017-01-22 NOTE — Progress Notes (Addendum)
PROGRESS NOTE  Jordan [name] WUJ:[id number] DOB: Feb 11, 1966 DOA: 01/21/2017 PCP: No PCP Per Patient  Brief History:  51 year old male with a history of hypertriglyceridemia resulting in pancreatitis and hypertension presenting with 3-4 day history of worsening abdominal pain with associated nausea and vomiting. The patient states that his epigastric abdominal pain began on 01/18/2017. It improved for a day or 2, but worsened again with associated nausea and vomiting on 01/22/2016. The patient had a similar admission from 08/15/2017 through 08/17/2017 for acute pancreatitis secondary to hypertriglyceridemia. The patient was discharged with fenofibrate at that time, but states that he cannot afford it and never filled the prescription. He's been taking 1 capsule of Fish oil daily for a second triglyceridemia. He denies any fevers, chills, chest pain, shortness breath, dysuria, hematuria, hematochezia, melena, headache, neck pain. Upon presentation, the patient was afebrile and clinically stable and saturating well on room air. Lipase 347 and WBC was 12.2 with triglycerides 4891.  Apparently, his blood was lipemic resulting in difficulty obtaining hepatic enzymes.  Assessment/Plan: Acute pancreatitis -Secondary to hypertriglyceridemia -Right upper quadrant ultrasound -Consult nephrology for plasmapheresis--spoke with Dr. Arta SilenceShertz -consulted IR for dialysis catheter placement -when able to tolerate po--restart lovaza and fenofibrate -pain control -IVF -HIV -UDS  Hyperkalemia -while may be partly pseudo-hyperkalemia with lipemic blood sample, place pt on tele -EKG--sinus without peaked T waves or QRS widening -kayexalate enema (can't tolerate po currently) -bicarbonate -calcium gluconate -D50 and IV insulin -place on tele  Tobacco abuse -nicoderm patch -tobacco cessation discussed  Elevated BP -no on any agent outpt -partly due to pain, but documented on numerous occasions in  past  Hypertriglyceridemia -start fenofibrate and lovaza when tolerating po  Leukocytosis -likely stress demargination -afebrile and hemodynamically stable -monitor clinically   Disposition Plan:   Home in 3-4 days--transfer to   Family Communication:   Spouse updated at bedside  Consultants:  Nephrology; IR  Code Status:  FULL  DVT Prophylaxis:   [place] Lovenox   Procedures: As Listed in Progress Note Above  Antibiotics: None    Subjective: Patient continues to have abdominal pain and dry heaving. Denies any fevers, chills, chest pain, shortness breath, coughing, hemoptysis, diarrhea. There is no dysuria or hematuria. Denies any headache or neck pain.  Objective: Vitals:   01/22/17 0030 01/22/17 0155 01/22/17 0437 01/22/17 0622  BP: 161/94 (!) 162/113 (!) 151/96   Pulse: 67 81 95   Resp: 15 18 18    Temp:  98.4 F (36.9 C) 98.2 F (36.8 C)   TempSrc:  Oral Oral   SpO2: 95% 94% 97%   Weight:  110 kg (242 lb 8.1 oz)  108.5 kg (239 lb 3.2 oz)  Height:    5\' 5"  (1.651 m)    Intake/Output Summary (Last 24 hours) at 01/22/17 1006 Last data filed at 01/22/17 0600  Gross per 24 hour  Intake          3810.42 ml  Output              450 ml  Net          3360.42 ml   Weight change:  Exam:   General:  Pt is alert, follows commands appropriately, not in acute distress  HEENT: No icterus, No thrush, No neck mass, [place]/AT  Cardiovascular: RRR, S1/S2, no rubs, no gallops  Respiratory: CTA bilaterally, no wheezing, no crackles, no rhonchi  Abdomen: Soft/+BS, epigastric tender, non distended, no guarding  Extremities: No edema, No lymphangitis, No petechiae, No rashes, no synovitis   Data Reviewed: I have personally reviewed following labs and imaging studies Basic Metabolic Panel:  Recent Labs Lab 01/21/17 1916 01/21/17 2125 01/21/17 2214  NA 134*  --   --   K 6.8* >7.5* >7.5*  CL 103  --   --   CO2 17*  --   --   GLUCOSE 120*  --   --   BUN 14   --   --   CREATININE 0.96  --   --   CALCIUM 9.6  --   --    Liver Function Tests:  Recent Labs Lab 01/21/17 1916  AST RESULTS UNAVAILABLE DUE TO INTERFERING SUBSTANCE  ALT RESULTS UNAVAILABLE DUE TO INTERFERING SUBSTANCE  ALKPHOS 38  BILITOT 4.2*  PROT RESULTS UNAVAILABLE DUE TO INTERFERING SUBSTANCE  ALBUMIN 4.2    Recent Labs Lab 01/21/17 1916  LIPASE 347*   No results for input(s): AMMONIA in the last 168 hours. Coagulation Profile: No results for input(s): INR, PROTIME in the last 168 hours. CBC:  Recent Labs Lab 01/21/17 1916 01/22/17 0618  WBC 7.9 12.2*  HGB 12.9* 15.7  HCT 38.3* 43.2  MCV 81.7 79.4  PLT 347 356   Cardiac Enzymes: No results for input(s): CKTOTAL, CKMB, CKMBINDEX, TROPONINI in the last 168 hours. BNP: Invalid input(s): POCBNP CBG: No results for input(s): GLUCAP in the last 168 hours. HbA1C: No results for input(s): HGBA1C in the last 72 hours. Urine analysis:    Component Value Date/Time   COLORURINE YELLOW 01/21/2017 1916   APPEARANCEUR CLEAR 01/21/2017 1916   LABSPEC 1.021 01/21/2017 1916   PHURINE 5.0 01/21/2017 1916   GLUCOSEU NEGATIVE 01/21/2017 1916   HGBUR MODERATE (A) 01/21/2017 1916   BILIRUBINUR NEGATIVE 01/21/2017 1916   KETONESUR NEGATIVE 01/21/2017 1916   PROTEINUR 30 (A) 01/21/2017 1916   UROBILINOGEN 1.0 03/02/2015 1224   NITRITE NEGATIVE 01/21/2017 1916   LEUKOCYTESUR NEGATIVE 01/21/2017 1916   Sepsis Labs: @LABRCNTIP (procalcitonin:4,lacticidven:4) )No results found for this or any previous visit (from the past 240 hour(s)).   Scheduled Meds: . enoxaparin (LOVENOX) injection  40 mg Subcutaneous Q24H  . famotidine  20 mg Oral Daily  . nicotine  21 mg Transdermal Daily  . omega-3 acid ethyl esters  2 g Oral BID  . sodium polystyrene  60 g Rectal Once   Continuous Infusions: . sodium chloride 125 mL/hr at 01/22/17 0207    Procedures/Studies: No results found.  Emy Angevine, DO  [facility] Pager [phone]  If 7PM-7AM, please contact night-coverage [email or web address] Password [email or web address] 01/22/2017, 10:06 AM   LOS: 1 day

## 2017-01-22 NOTE — ED Notes (Signed)
Pt c/o pain and nausea

## 2017-01-22 NOTE — Progress Notes (Signed)
PT Cancellation Note  Patient Details Name: Kela MillinJutono Tornow MRN: [id number] DOB: Aug 31, 1966   Cancelled Treatment:    Reason Eval/Treat Not Completed: Medical issues which prohibited therapy ([facility] up with nursing earlier,)   Rada HayHill, Lachrista Heslin Elizabeth 01/22/2017, 5:36 PM Blanchard KelchKaren Terryl Molinelli PT [phone]

## 2017-01-22 NOTE — ED Notes (Signed)
Spoke with admitting MD and pt [facility]  [facility]

## 2017-01-22 NOTE — Progress Notes (Signed)
[facility] and [facility]. [facility].

## 2017-01-22 NOTE — ED Notes (Signed)
W[facility] called and [facility]

## 2017-01-22 NOTE — Progress Notes (Signed)
Admission note:  Arrival Method: [facility]. Mental Orientation: Alert and oriented x 4. Telemetry: 6E-16, NSR, CCMD notified. Assessment: See doc flow sheets. Skin: Warm dry and intact.   IV: Left AC NS 125 ml/hr. Pain: 10/10, see MAR. Tubes: N/A Safety Measures: Call bell and phone within reach. Fall Prevention Safety Plan: Reviewed the plan with the pt and understood and acknowledged. Admission Screening: Complete. 6700 Orientation: Patient has been oriented to the unit, staff and to the room.

## 2017-01-23 ENCOUNTER — Encounter (HOSPITAL_COMMUNITY): Payer: Self-pay | Admitting: Diagnostic Radiology

## 2017-01-23 ENCOUNTER — Inpatient Hospital Stay (HOSPITAL_COMMUNITY): Payer: BLUE CROSS/BLUE SHIELD

## 2017-01-23 DIAGNOSIS — R030 Elevated blood-pressure reading, without diagnosis of hypertension: Secondary | ICD-10-CM

## 2017-01-23 HISTORY — PX: IR GENERIC HISTORICAL: IMG1180011

## 2017-01-23 LAB — COMPREHENSIVE METABOLIC PANEL
ALBUMIN: 4 g/dL (ref 3.5–5.0)
ALK PHOS: 40 U/L (ref 38–126)
ALT: 34 U/L (ref 17–63)
ANION GAP: 13 (ref 5–15)
AST: 47 U/L — ABNORMAL HIGH (ref 15–41)
BUN: 8 mg/dL (ref 6–20)
CHLORIDE: 101 mmol/L (ref 101–111)
CO2: 20 mmol/L — AB (ref 22–32)
Calcium: 9.1 mg/dL (ref 8.9–10.3)
Creatinine, Ser: 0.81 mg/dL (ref 0.61–1.24)
GFR calc non Af Amer: >60 mL/min (ref >60)
GLUCOSE: 108 mg/dL — AB (ref 65–99)
POTASSIUM: 4.3 mmol/L (ref 3.5–5.1)
SODIUM: 134 mmol/L — AB (ref 135–145)
Total Bilirubin: 2.1 mg/dL — ABNORMAL HIGH (ref 0.3–1.2)
Total Protein: 7.3 g/dL (ref 6.5–8.1)

## 2017-01-23 LAB — POCT I-STAT, CHEM 8
BUN: 8 mg/dL (ref 6–20)
CALCIUM ION: 1.16 mmol/L (ref 1.15–1.40)
CHLORIDE: 101 mmol/L (ref 101–111)
CREATININE: 0.8 mg/dL (ref 0.61–1.24)
GLUCOSE: 101 mg/dL — AB (ref 65–99)
HCT: 41 % (ref 39.0–52.0)
Hemoglobin: 13.9 g/dL (ref 13.0–17.0)
POTASSIUM: 3.4 mmol/L — AB (ref 3.5–5.1)
Sodium: 137 mmol/L (ref 135–145)
TCO2: 24 mmol/L (ref 0–100)

## 2017-01-23 LAB — CBC
HEMATOCRIT: 42.8 % (ref 39.0–52.0)
HEMOGLOBIN: 14.8 g/dL (ref 13.0–17.0)
MCH: 27.3 pg (ref 26.0–34.0)
MCHC: 34.6 g/dL (ref 30.0–36.0)
MCV: 79 fL (ref 78.0–100.0)
Platelets: 218 10*3/uL (ref 150–400)
RBC: 5.42 MIL/uL (ref 4.22–5.81)
RDW: 14.4 % (ref 11.5–15.5)
WBC: 13.7 10*3/uL — ABNORMAL HIGH (ref 4.0–10.5)

## 2017-01-23 LAB — TRIGLYCERIDES: Triglycerides: 312 mg/dL — ABNORMAL HIGH (ref <150)

## 2017-01-23 LAB — HIV ANTIBODY (ROUTINE TESTING W REFLEX): HIV Screen 4th Generation wRfx: NONREACTIVE

## 2017-01-23 MED ORDER — IOPAMIDOL (ISOVUE-300) INJECTION 61%
INTRAVENOUS | Status: AC
  Administered 2017-01-23: 100 mL
  Filled 2017-01-23: qty 100

## 2017-01-23 MED ORDER — CALCIUM CARBONATE ANTACID 500 MG PO CHEW
2.0000 | CHEWABLE_TABLET | ORAL | Status: AC
  Administered 2017-01-23: 400 mg via ORAL

## 2017-01-23 MED ORDER — ACD FORMULA A 0.73-2.45-2.2 GM/100ML VI SOLN
Status: AC
  Administered 2017-01-23: 14:00:00
  Filled 2017-01-23: qty 500

## 2017-01-23 MED ORDER — FENOFIBRATE 160 MG PO TABS
160.0000 mg | ORAL_TABLET | Freq: Every day | ORAL | Status: DC
  Administered 2017-01-23 – 2017-01-26 (×4): 160 mg via ORAL
  Filled 2017-01-23 (×4): qty 1

## 2017-01-23 MED ORDER — ACETAMINOPHEN 325 MG PO TABS: 650.0000 mg | ORAL_TABLET | ORAL | Status: DC | PRN

## 2017-01-23 MED ORDER — ACD FORMULA A 0.73-2.45-2.2 GM/100ML VI SOLN
Status: AC
  Filled 2017-01-23: qty 500

## 2017-01-23 MED ORDER — CALCIUM CARBONATE ANTACID 500 MG PO CHEW
CHEWABLE_TABLET | ORAL | Status: AC
  Filled 2017-01-23: qty 2

## 2017-01-23 MED ORDER — LIDOCAINE HCL (PF) 1 % IJ SOLN
INTRAMUSCULAR | Status: AC
  Filled 2017-01-23: qty 10

## 2017-01-23 MED ORDER — ACD FORMULA A 0.73-2.45-2.2 GM/100ML VI SOLN
500.0000 mL | Status: DC
  Administered 2017-01-23: 500 mL via INTRAVENOUS
  Filled 2017-01-23: qty 500

## 2017-01-23 MED ORDER — HEPARIN SODIUM (PORCINE) 1000 UNIT/ML IJ SOLN: 1000.0000 [IU] | Freq: Once | INTRAMUSCULAR | Status: DC

## 2017-01-23 MED ORDER — KCL IN DEXTROSE-NACL 20-5-0.9 MEQ/L-%-% IV SOLN
INTRAVENOUS | Status: DC
  Administered 2017-01-23 – 2017-01-24 (×2): via INTRAVENOUS
  Administered 2017-01-26: 1000 mL via INTRAVENOUS
  Filled 2017-01-23 (×7): qty 1000

## 2017-01-23 MED ORDER — FAMOTIDINE IN NACL 20-0.9 MG/50ML-% IV SOLN
20.0000 mg | Freq: Two times a day (BID) | INTRAVENOUS | Status: DC
  Administered 2017-01-23 – 2017-01-25 (×4): 20 mg via INTRAVENOUS
  Filled 2017-01-23 (×4): qty 50

## 2017-01-23 MED ORDER — HEPARIN SODIUM (PORCINE) 1000 UNIT/ML IJ SOLN
INTRAMUSCULAR | Status: AC
  Filled 2017-01-23: qty 1

## 2017-01-23 MED ORDER — LIDOCAINE HCL (PF) 1 % IJ SOLN
INTRAMUSCULAR | Status: DC | PRN
  Administered 2017-01-23: 5 mL

## 2017-01-23 MED ORDER — SODIUM CHLORIDE 0.9 % IV SOLN
INTRAVENOUS | Status: AC
  Administered 2017-01-23 (×4): via INTRAVENOUS_CENTRAL
  Filled 2017-01-23 (×4): qty 200

## 2017-01-23 MED ORDER — SODIUM CHLORIDE 0.9 % IV SOLN
4.0000 g | Freq: Once | INTRAVENOUS | Status: AC
  Administered 2017-01-23: 4 g via INTRAVENOUS
  Filled 2017-01-23: qty 40

## 2017-01-23 MED ORDER — DIPHENHYDRAMINE HCL 25 MG PO CAPS: 25.0000 mg | ORAL_CAPSULE | Freq: Four times a day (QID) | ORAL | Status: DC | PRN

## 2017-01-23 MED ORDER — HYDRALAZINE HCL 20 MG/ML IJ SOLN
10.0000 mg | Freq: Four times a day (QID) | INTRAMUSCULAR | Status: DC | PRN
  Administered 2017-01-23 – 2017-01-24 (×2): 10 mg via INTRAVENOUS
  Filled 2017-01-23 (×2): qty 1

## 2017-01-23 MED ORDER — IOPAMIDOL (ISOVUE-300) INJECTION 61%
INTRAVENOUS | Status: AC
  Administered 2017-01-23: 30 mL
  Filled 2017-01-23: qty 30

## 2017-01-23 NOTE — Procedures (Signed)
Post-Procedure Note  Pre-operative Diagnosis: Hypertriglyceridemia       Post-operative Diagnosis: Hypertriglyceridemia   Indications: Needs catheter for Plasmapheresis  Procedure:  Placement of Pheresis catheter (non-tunneled)  Procedure Details:   Rt IJ is patent.  Placed Temp Cath and tip at SVC/RA junction.  Findings: tip at SVC/RA junction  Complications: None    EBL:  Minimal  Plan: Catheter is ready for use.

## 2017-01-23 NOTE — Consult Note (Signed)
[facility] Consultation Note  Referring Provider: Dr. Crista Elliotron Bhandari [facility] Primary Care Physician:  No PCP Per Patient Primary Gastroenterologist:  Dr. Willis ModenaWilliam Yoko Mcgahee   Reason for Consultation:  Pancreatitis.  HPI: Kela MillinJutono Ercole is a 51 y.o. male admitted with two-day history of abdominal pain, elevated lipase.  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  Tg level nearly 5,000!  Receiving plasmapheresis.  EUS couple years ago showed no gallstones/bile duct stones or pancreatic mass.  Scant alcohol intake.  Multiple recurrent bouts of pancreatitis.   Past Medical History:  Diagnosis Date  . Arthritis    right elbow pain all the time, took cortisone shot for  . Elevated blood pressure reading with diagnosis of hypertension   . GERD (gastroesophageal reflux disease)   . Pancreatitis 12-29-2013    Past Surgical History:  Procedure Laterality Date  . EUS N/A 08/12/2014   Procedure: ESOPHAGEAL ENDOSCOPIC ULTRASOUND (EUS) RADIAL;  Surgeon: Willis ModenaWilliam Garmon Dehn, MD;  Location: [facility];  Service: Endoscopy;  Laterality: N/A;  . FINE NEEDLE ASPIRATION N/A 08/12/2014   Procedure: FINE NEEDLE ASPIRATION (FNA) LINEAR;  Surgeon: Willis ModenaWilliam Rylen Swindler, MD;  Location: [facility];  Service: Endoscopy;  Laterality: N/A;  . IR GENERIC HISTORICAL  01/23/2017   IR US GUIDE VASC ACCESS RIGHT 01/23/2017 Richarda OverlieAdam Henn, MD [facility]  . IR GENERIC HISTORICAL  01/23/2017   IR FLUORO GUIDE CV LINE RIGHT 01/23/2017 Richarda OverlieAdam Henn, MD [facility]  . NO PAST SURGERIES      Prior to Admission medications   Medication Sig Start Date End Date Taking? Authorizing Provider  ibuprofen (ADVIL,MOTRIN) 200 MG tablet Take 400 mg by mouth every 6 (six) hours as needed.   Yes Historical Provider, MD  Omega-3 Fatty Acids (FISH OIL PO) Take 1 capsule by mouth daily.    Yes Historical Provider, MD  RaNITidine HCl (ACID REDUCER PO) Take 1 capsule by mouth daily.   Yes Historical Provider, MD    Current Facility-Administered  Medications  Medication Dose Route Frequency Provider Last Rate Last Dose  . citrate dextrose (ACD-A anticoagulant) 0.73-2.45-2.2 GM/100ML solution           . 0.9 %  sodium chloride infusion   Intravenous Continuous Catarina Hartshornavid Tat, MD 125 mL/hr at 01/23/17 0456    . acetaminophen (TYLENOL) tablet 650 mg  650 mg Oral Q4H PRN Delano Metzobert Schertz, MD      . acetaminophen (TYLENOL) tablet 650 mg  650 mg Oral Q4H PRN Delano Metzobert Schertz, MD      . albumin human 50 g in sodium chloride 0.9 %   Dialysis Once in dialysis Delano Metzobert Schertz, MD      . albumin human 50 g in sodium chloride 0.9 %   Dialysis Q1 Hr x 4 Delano Metzobert Schertz, MD      . calcium carbonate (TUMS - dosed in mg elemental calcium) chewable tablet 400 mg of elemental calcium  2 tablet Oral Q3H Delano Metzobert Schertz, MD      . calcium gluconate 4 g in sodium chloride 0.9 % 250 mL IVPB  4 g Intravenous Once Delano Metzobert Schertz, MD      . calcium gluconate 4 g in sodium chloride 0.9 % 250 mL IVPB  4 g Intravenous Once Delano Metzobert Schertz, MD   4 g at 01/23/17 1216  . citrate dextrose (ACD-A anticoagulant) solution 500 mL  500 mL Intravenous Continuous Delano Metzobert Schertz, MD      . citrate dextrose (ACD-A anticoagulant) solution 500 mL  500 mL Intravenous Continuous Delano Metzobert Schertz, MD  500 mL at 01/23/17 1228  . diphenhydrAMINE (BENADRYL) capsule 25 mg  25 mg Oral Q6H PRN Delano Metz, MD      . enoxaparin (LOVENOX) injection 40 mg  40 mg Subcutaneous Q24H Jonah Blue, MD   40 mg at 01/23/17 1000  . famotidine (PEPCID) tablet 20 mg  20 mg Oral Daily Jonah Blue, MD      . heparin 1000 UNIT/ML injection           . heparin injection 1,000 Units  1,000 Units Intracatheter Once Delano Metz, MD      . heparin injection 1,000 Units  1,000 Units Intracatheter Once Delano Metz, MD      . hydrALAZINE (APRESOLINE) injection 10 mg  10 mg Intravenous Q6H PRN Dron Jaynie Collins, MD   10 mg at 01/23/17 1045  . HYDROmorphone (DILAUDID) injection 0.5 mg  0.5 mg Intravenous Q3H PRN  Catarina Hartshorn, MD   0.5 mg at 01/23/17 0959  . ketorolac (TORADOL) 30 MG/ML injection 30 mg  30 mg Intravenous Q6H PRN Jonah Blue, MD   30 mg at 01/23/17 0600  . lidocaine (PF) (XYLOCAINE) 1 % injection           . lidocaine (PF) (XYLOCAINE) 1 % injection    PRN [name], MD   5 mL at 01/23/17 0915  . nicotine (NICODERM CQ - dosed in mg/24 hours) patch 21 mg  21 mg Transdermal Daily Jonah Blue, MD   21 mg at 01/23/17 1008  . omega-3 acid ethyl esters (LOVAZA) capsule 2 g  2 g Oral BID Jonah Blue, MD      . ondansetron [facility]) tablet 4 mg  4 mg Oral Q6H PRN Jonah Blue, MD       Or  . ondansetron [facility]) injection 4 mg  4 mg Intravenous Q6H PRN Jonah Blue, MD   4 mg at 01/23/17 0959    Allergies as of 01/21/2017 - Review Complete 01/21/2017  Allergen Reaction Noted  . Fish allergy Nausea And Vomiting and Other (See Comments) 11/27/2015    Family History  Problem Relation Age of Onset  . Other Mother     Healthy  . Other Father     Healthy  . Pancreatitis Neg Hx     Social History   Social History  . Marital status: Married    Spouse name: N/A  . Number of children: N/A  . Years of education: N/A   Occupational History  . Not on file.   Social History Main Topics  . Smoking status: Current Every Day Smoker    Packs/day: 1.00    Years: 38.00    Types: Cigarettes  . Smokeless tobacco: Never Used  . Alcohol use Yes     Comment: little to no  . Drug use: No  . Sexual activity: Not on file   Other Topics Concern  . Not on file   Social History Narrative  . No narrative on file    Review of Systems: As per HPI, all others negative  Physical Exam: Vital signs in last 24 hours: Temp:  [98.1 F (36.7 C)-100.5 F (38.1 C)] 98.1 F (36.7 C) (03/06 1216) Pulse Rate:  [81-96] 96 (03/06 1307) Resp:  [17-27] 25 (03/06 1307) BP: (165-205)/(103-117) 179/103 (03/06 1307) SpO2:  [94 %-97 %] 94 % (03/06 1150) Weight:  [86 kg (189 lb 9.5 oz)] 86 kg (189 lb  9.5 oz) (03/06 1150) Last BM Date: 01/22/17 General:   Alert, overweight, currently receiving  plasmapheresis, Well-developed, well-nourished, pleasant and cooperative in NAD Head:  Normocephalic and atraumatic. Eyes:  Sclera clear, no icterus.   Conjunctiva pink. Ears:  Normal auditory acuity. Nose:  No deformity, discharge,  or lesions. Mouth:  No deformity or lesions.  Oropharynx pink & moist. Neck:  Supple; no masses or thyromegaly. Lungs:  Clear throughout to auscultation.   No wheezes, crackles, or rhonchi. No acute distress. Heart:  Regular rate and rhythm; no murmurs, clicks, rubs,  or gallops. Abdomen:  Soft, protuberant, scant bowel sounds, epigastric tenderness with voluntary guarding. No masses, hepatosplenomegaly or hernias noted.    Msk:  Symmetrical without gross deformities. Normal posture. Pulses:  Normal pulses noted. Extremities:  Without clubbing or edema. Neurologic:  Alert and  oriented x4; diffusely weak, otherwise grossly normal neurologically. Skin:  Intact without significant lesions or rashes. Psych:  Alert and cooperative. Normal mood and affect.   Lab Results:  Recent Labs  01/21/17 1916 01/22/17 0618 01/23/17 0529 01/23/17 1213  WBC 7.9 12.2* 13.7*  --   HGB 12.9* 15.7 14.8 13.9  HCT 38.3* 43.2 42.8 41.0  PLT 347 356 218  --    BMET  Recent Labs  01/21/17 1916  01/22/17 0951 01/23/17 0529 01/23/17 1213  NA 134*  --  129* 134* 137  K 6.8*  < > 5.8* 4.3 3.4*  CL 103  --  99* 101 101  CO2 17*  --  21* 20*  --   GLUCOSE 120*  --  120* 108* 101*  BUN 14  --  8 8 8   CREATININE 0.96  --  1.30* 0.81 0.80  CALCIUM 9.6  --  8.8* 9.1  --   < > = values in this interval not displayed. LFT  Recent Labs  01/23/17 0529  PROT 7.3  ALBUMIN 4.0  AST 47*  ALT 34  ALKPHOS 40  BILITOT 2.1*   PT/INR No results for input(s): LABPROT, INR in the last 72 hours.  Studies/Results: Ir Fluoro Guide Cv Line Right  Result Date: 01/23/2017 INDICATION:  51 year old with hypertriglyceridemia. Patient needs a catheter for pheresis. EXAM: FLUOROSCOPIC AND ULTRASOUND GUIDED PLACEMENT OF A NON-TUNNELED PHERESIS CATHETER Physician: Rachelle Hora. Henn, MD MEDICATIONS: None ANESTHESIA/SEDATION: None FLUOROSCOPY TIME:  Fluoroscopy Time: 24 seconds, 1.4 mGy COMPLICATIONS: None immediate. PROCEDURE: The procedure was explained to the patient. The risks and benefits of the procedure were discussed and the patient's questions were addressed. Informed consent was obtained from the patient. The patient was placed supine on the interventional table. Ultrasound confirmed a patent right internal jugular vein. Ultrasound images were obtained for documentation. The right side of the neck was prepped and draped in a sterile fashion. The right side of the neck was anesthetized with 1% lidocaine. Maximal barrier sterile technique was utilized including caps, mask, sterile gowns, sterile gloves, sterile drape, hand hygiene and skin antiseptic. A small incision was made with #11 blade scalpel. A 21 gauge needle directed into the right internal jugular vein with ultrasound guidance. A micropuncture dilator set was placed. A 20 cm Mahurkar catheter was selected. The catheter was advanced over a wire and positioned at the superior cavoatrial junction. Fluoroscopic images were obtained for documentation. Both pheresis lumens were found to aspirate and flush well. The proper amount of heparin was flushed in both lumens. The central venous lumen was flushed with normal saline. Catheter was sutured to skin. FINDINGS: Catheter tip at the superior cavoatrial junction. IMPRESSION: Successful placement of a right jugular non-tunneled pheresis catheter using  ultrasound and fluoroscopic guidance. Electronically Signed   By: [name] M.D.   On: 01/23/2017 11:38   Ir US Guide Vasc Access Right  Result Date: 01/23/2017 INDICATION: 51 year old with hypertriglyceridemia. Patient needs a catheter for  pheresis. EXAM: FLUOROSCOPIC AND ULTRASOUND GUIDED PLACEMENT OF A NON-TUNNELED PHERESIS CATHETER Physician: Rachelle Hora. Henn, MD MEDICATIONS: None ANESTHESIA/SEDATION: None FLUOROSCOPY TIME:  Fluoroscopy Time: 24 seconds, 1.4 mGy COMPLICATIONS: None immediate. PROCEDURE: The procedure was explained to the patient. The risks and benefits of the procedure were discussed and the patient's questions were addressed. Informed consent was obtained from the patient. The patient was placed supine on the interventional table. Ultrasound confirmed a patent right internal jugular vein. Ultrasound images were obtained for documentation. The right side of the neck was prepped and draped in a sterile fashion. The right side of the neck was anesthetized with 1% lidocaine. Maximal barrier sterile technique was utilized including caps, mask, sterile gowns, sterile gloves, sterile drape, hand hygiene and skin antiseptic. A small incision was made with #11 blade scalpel. A 21 gauge needle directed into the right internal jugular vein with ultrasound guidance. A micropuncture dilator set was placed. A 20 cm Mahurkar catheter was selected. The catheter was advanced over a wire and positioned at the superior cavoatrial junction. Fluoroscopic images were obtained for documentation. Both pheresis lumens were found to aspirate and flush well. The proper amount of heparin was flushed in both lumens. The central venous lumen was flushed with normal saline. Catheter was sutured to skin. FINDINGS: Catheter tip at the superior cavoatrial junction. IMPRESSION: Successful placement of a right jugular non-tunneled pheresis catheter using ultrasound and fluoroscopic guidance. Electronically Signed   By: [name] M.D.   On: 01/23/2017 11:38   US Abdomen Limited Ruq  Result Date: 01/22/2017 CLINICAL DATA:  Acute pancreatitis. EXAM: US ABDOMEN LIMITED - RIGHT UPPER QUADRANT COMPARISON:  CT scan 07/16/2016. FINDINGS: Gallbladder: No gallstones or wall  thickening visualized. No sonographic Murphy sign noted by sonographer. Common bile duct: Technically difficult to visualize, but appears to measure in the 3-4 mm range. Liver: Increased echogenicity with poor acoustic through transmission, features suggesting fatty deposition.No focal lesion identified. Within normal limits in parenchymal echogenicity. IMPRESSION: Fatty liver.  Otherwise unremarkable. Electronically Signed   By: Kennith Center M.D.   On: 01/22/2017 11:10   Impression:  1.  Acute pancreatitis. 2.  Hypertriglyceridemia. 3.  Abdominal pain and leukocytosis and low-grade fevers, likely from #1/2 above, can't rule out necrosis and/or abscess.  Plan:  1.  CT scan abdomen/pelvis with contrast to further assess, and to assess for pancreatic necrosis and/or abscess. 2.  Plasmapheresis for hypertriglyceridemia. 3.  Patient's pancreatitis is directly linked to hypertriglyceridemia, and will recur unless/until the Tg level can be brought significantly down.  We can try to get down acutely here in hospital, but patient will need close outpatient follow-up with Endocrinology (Dr. Sharl Ma) as outpatient.  We in GI do not treat hypertriglyceridemia, we just identify cases of pancreatitis due to that entity. 4.  Treat pancreatitis per routine with judicious analgesics, IV fluids, NPO. 5.  [facility] will follow.    LOS: 2 days   Buddie Marston M  01/23/2017, 1:24 PM  Pager [phone] If no answer or after 5 PM call [phone]

## 2017-01-23 NOTE — Evaluation (Signed)
Physical Therapy Evaluation Patient Details Name: Jordan [name] MRN: [id number] DOB: 05-17-1966 Today's Date: 01/23/2017   History of Present Illness  Jordan [name] is a 51 y.o. 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  Clinical Impression  Pt is at or close to baseline functioning and should be safe at home with/without assist. There are no further acute PT needs.  Will sign off at this time.     Follow Up Recommendations No PT follow up    Equipment Recommendations  None recommended by PT    Recommendations for Other Services       Precautions / Restrictions Precautions Precautions: Fall (minimal risk)      Mobility  Bed Mobility Overal bed mobility: Modified Independent                Transfers Overall transfer level: Independent                  Ambulation/Gait Ambulation/Gait assistance: Supervision Ambulation Distance (Feet): 500 Feet Assistive device: None Gait Pattern/deviations: Step-through pattern   Gait velocity interpretation: Below normal speed for age/gender General Gait Details: generally steady with pain increasing mildly behind L knee with longer distance walking.  See DGI  Stairs Stairs: Yes Stairs assistance: Modified independent (Device/Increase time) Stair Management: One rail Left;Alternating pattern;Forwards Number of Stairs: 3 General stair comments: safe with rail  Wheelchair Mobility    Modified Rankin (Stroke Patients Only)       Balance Overall balance assessment: Needs assistance   Sitting balance-Leahy Scale: Normal       Standing balance-Leahy Scale: Good                   Standardized Balance Assessment Standardized Balance Assessment : Dynamic Gait Index   Dynamic Gait Index Level Surface: Normal Change in Gait Speed: Mild  Impairment Gait with Horizontal Head Turns: Normal Gait with Vertical Head Turns: Normal Gait and Pivot Turn: Normal Step Over Obstacle: Normal Step Around Obstacles: Normal Steps: Mild Impairment Total Score: 22       Pertinent Vitals/Pain Pain Assessment: 0-10 Pain Score: 5  Pain Location: stomach Pain Descriptors / Indicators: Sore;Sharp Pain Intervention(s): Monitored during session;Premedicated before session    Home Living                        Prior Function                 Hand Dominance        Extremity/Trunk Assessment                Communication      Cognition Arousal/Alertness: Awake/alert Behavior During Therapy: WFL for tasks assessed/performed Overall Cognitive Status: Within Functional Limits for tasks assessed                      General Comments      Exercises     Assessment/Plan    PT Assessment    PT Problem List         PT Treatment Interventions      PT Goals (Current goals can be found in the Care Plan section)  Acute Rehab PT Goals Patient Stated Goal: get this pain and condition under control PT Goal Formulation: All assessment and education complete, DC therapy    Frequency     Barriers  to discharge        Co-evaluation               End of Session   Activity Tolerance: Patient tolerated treatment well Patient left: in bed Nurse Communication: Mobility status PT Visit Diagnosis: Pain Pain - Right/Left: Left Pain - part of body: Knee         Time: [id number] PT Time Calculation (min) (ACUTE ONLY): 20 min   Charges:   PT Evaluation $PT Eval Moderate Complexity: 1 Procedure     PT G CodesEliseo Gum:         Stpehen Petitjean V Amayrani Bennick 01/23/2017, 12:09 PM 01/23/2017  [place] Bing[place] Danyelle [place], [place] [phone]  (pager)

## 2017-01-23 NOTE — Progress Notes (Signed)
Patient ID: Jordan [name], male   DOB: [birth year], 51 y.o.   MRN: [id number] Patient needs a Temp Cath for plasmapheresis.  Explained catheter placement with patient and informed consent obtained.  Plan for image-guided catheter placement.

## 2017-01-23 NOTE — Progress Notes (Addendum)
PROGRESS NOTE    Jordan [name]  ZOX:[id number] DOB: 06-27-1966 DOA: 01/21/2017 PCP: No PCP Per Patient   Brief Narrative: 51 year old male with a history of hypertriglyceridemia resulting in pancreatitis and hypertension presenting with 3-4 day history of worsening abdominal pain with associated nausea and vomiting. The patient had a similar admission from 08/15/2017 through 08/17/2017 for acute pancreatitis secondary to hypertriglyceridemia. The patient was discharged with fenofibrate at that time, but states that he cannot afford it and never filled the prescription. He's been taking 1 capsule of Fish oil daily for a second triglyceridemia.Upon presentation, the patient was afebrile and clinically stable and saturating well on room air. Lipase 347 and WBC was 12.2 with triglycerides 4891.  Apparently, his blood was lipemic resulting in difficulty obtaining hepatic enzymes.  Assessment & Plan:   # Acute pancreatitis in the setting of hypertriglyceridemia: -Patient was already started on plasma exchange via temporary catheter placement. Patient was seen by nephrologist for plasma exchange. -I consulted GI and discussed with Dr. Dulce Sellar. Patient was seen by Dr. Sallee Lange the past. -Continue supportive care including IV fluid, pain treatment and will start clears if tolerated.. -I educated patient to follow-up with endocrinologist and PCP for the management of dyslipidemia as an outpatient. He verbalized understanding. For now continue to monitor triglyceride. Currently on lovaza.  -I will start fibrate. I will check lipid panel.  -Follow-up CT abdomen pelvis ordered by GI.  # Hyperkalemia: Serum potassium level improved. Patient has normal serum creatinine level.  #Tobacco abuse: Nicotine patch.  # Elevated blood pressure likely contributed by pain: Patient reported that he was not diagnosed with hypertension and does not take medicine at home. He was found to have systolic blood pressure more than  200. I ordered hydralazine IV as needed. Continue to manage his pain and monitor blood pressure level closely.  Principal Problem:   Acute pancreatitis Active Problems:   Tobacco use   Hypertriglyceridemia    Hyperkalemia  DVT prophylaxis: Lovenox subcutaneous Code Status: DO NOT RESUSCITATE Family Communication: Discussed with the patient's sister at bedside Disposition Plan: Likely discharge home in 2-3 days depending on clinical improvement    Consultants:   Gastroenterologist  Nephrologist  Procedures: Temporary catheter placement and initiation of plasma exchange Antimicrobials: None  Subjective: Patient was seen and examined. He reported abdominal pain improving with the current medications. Has nausea but denied vomiting. No chest pain or shortness of breath. Objective: Vitals:   01/23/17 1415 01/23/17 1430 01/23/17 1448 01/23/17 1459  BP: (!) 149/105 (!) 166/113 (!) 171/114 (!) 154/102  Pulse: 94 92 96 97  Resp: (!) 24 (!) 28 (!) 34 (!) 33  Temp:      TempSrc:      SpO2:      Weight:      Height:        Intake/Output Summary (Last 24 hours) at 01/23/17 1512 Last data filed at 01/23/17 1425  Gross per 24 hour  Intake           606.25 ml  Output                0 ml  Net           606.25 ml   Filed Weights   01/22/17 0155 01/22/17 0622 01/23/17 1150  Weight: 110 kg (242 lb 8.1 oz) 108.5 kg (239 lb 3.2 oz) 86 kg (189 lb 9.5 oz)    Examination:  General exam: Appears calm and comfortable  Respiratory system: Clear to  auscultation. Respiratory effort normal. No wheezing or crackle Cardiovascular system: S1 & S2 heard, RRR.  No pedal edema. Gastrointestinal system: Abdomen is distended, soft and mild epigastric tenderness. Normal bowel sounds heard. Central nervous system: Alert and oriented. No focal neurological deficits. Extremities: Symmetric 5 x 5 power. Skin: No rashes, lesions or ulcers Psychiatry: Judgement and insight appear normal. Mood & affect  appropriate.     Data Reviewed: I have personally reviewed following labs and imaging studies  CBC:  Recent Labs Lab 01/21/17 1916 01/22/17 0618 01/23/17 0529 01/23/17 1213  WBC 7.9 12.2* 13.7*  --   HGB 12.9* 15.7 14.8 13.9  HCT 38.3* 43.2 42.8 41.0  MCV 81.7 79.4 79.0  --   PLT 347 356 218  --    Basic Metabolic Panel:  Recent Labs Lab 01/21/17 1916 01/21/17 2125 01/21/17 2214 01/22/17 0951 01/23/17 0529 01/23/17 1213  NA 134*  --   --  129* 134* 137  K 6.8* >7.5* >7.5* 5.8* 4.3 3.4*  CL 103  --   --  99* 101 101  CO2 17*  --   --  21* 20*  --   GLUCOSE 120*  --   --  120* 108* 101*  BUN 14  --   --  8 8 8   CREATININE 0.96  --   --  1.30* 0.81 0.80  CALCIUM 9.6  --   --  8.8* 9.1  --    GFR: Estimated Creatinine Clearance: 112.6 mL/min (by C-G formula based on SCr of 0.8 mg/dL). Liver Function Tests:  Recent Labs Lab 01/21/17 1916 01/22/17 0951 01/23/17 0529  AST RESULTS UNAVAILABLE DUE TO INTERFERING SUBSTANCE 66* 47*  ALT RESULTS UNAVAILABLE DUE TO INTERFERING SUBSTANCE 48 34  ALKPHOS 38 37* 40  BILITOT 4.2* 3.1* 2.1*  PROT RESULTS UNAVAILABLE DUE TO INTERFERING SUBSTANCE 6.8 7.3  ALBUMIN 4.2 4.7 4.0    Recent Labs Lab 01/21/17 1916  LIPASE 347*   No results for input(s): AMMONIA in the last 168 hours. Coagulation Profile: No results for input(s): INR, PROTIME in the last 168 hours. Cardiac Enzymes: No results for input(s): CKTOTAL, CKMB, CKMBINDEX, TROPONINI in the last 168 hours. BNP (last 3 results) No results for input(s): PROBNP in the last 8760 hours. HbA1C: No results for input(s): HGBA1C in the last 72 hours. CBG: No results for input(s): GLUCAP in the last 168 hours. Lipid Profile:  Recent Labs  01/21/17 2020  TRIG 4,891*   Thyroid Function Tests: No results for input(s): TSH, T4TOTAL, FREET4, T3FREE, THYROIDAB in the last 72 hours. Anemia Panel: No results for input(s): VITAMINB12, FOLATE, FERRITIN, TIBC, IRON, RETICCTPCT  in the last 72 hours. Sepsis Labs: No results for input(s): PROCALCITON, LATICACIDVEN in the last 168 hours.  No results found for this or any previous visit (from the past 240 hour(s)).       Radiology Studies: Ir Fluoro Guide Cv Line Right  Result Date: 01/23/2017 INDICATION: 51 year old with hypertriglyceridemia. Patient needs a catheter for pheresis. EXAM: FLUOROSCOPIC AND ULTRASOUND GUIDED PLACEMENT OF A NON-TUNNELED PHERESIS CATHETER Physician: Rachelle Hora. Henn, MD MEDICATIONS: None ANESTHESIA/SEDATION: None FLUOROSCOPY TIME:  Fluoroscopy Time: 24 seconds, 1.4 mGy COMPLICATIONS: None immediate. PROCEDURE: The procedure was explained to the patient. The risks and benefits of the procedure were discussed and the patient's questions were addressed. Informed consent was obtained from the patient. The patient was placed supine on the interventional table. Ultrasound confirmed a patent right internal jugular vein. Ultrasound images were obtained for documentation. The  right side of the neck was prepped and draped in a sterile fashion. The right side of the neck was anesthetized with 1% lidocaine. Maximal barrier sterile technique was utilized including caps, mask, sterile gowns, sterile gloves, sterile drape, hand hygiene and skin antiseptic. A small incision was made with #11 blade scalpel. A 21 gauge needle directed into the right internal jugular vein with ultrasound guidance. A micropuncture dilator set was placed. A 20 cm Mahurkar catheter was selected. The catheter was advanced over a wire and positioned at the superior cavoatrial junction. Fluoroscopic images were obtained for documentation. Both pheresis lumens were found to aspirate and flush well. The proper amount of heparin was flushed in both lumens. The central venous lumen was flushed with normal saline. Catheter was sutured to skin. FINDINGS: Catheter tip at the superior cavoatrial junction. IMPRESSION: Successful placement of a right jugular  non-tunneled pheresis catheter using ultrasound and fluoroscopic guidance. Electronically Signed   By: Richarda OverlieAdam  Henn M.D.   On: 01/23/2017 11:38   Ir Koreas Guide Vasc Access Right  Result Date: 01/23/2017 INDICATION: 51 year old with hypertriglyceridemia. Patient needs a catheter for pheresis. EXAM: FLUOROSCOPIC AND ULTRASOUND GUIDED PLACEMENT OF A NON-TUNNELED PHERESIS CATHETER Physician: Rachelle HoraAdam R. Henn, MD MEDICATIONS: None ANESTHESIA/SEDATION: None FLUOROSCOPY TIME:  Fluoroscopy Time: 24 seconds, 1.4 mGy COMPLICATIONS: None immediate. PROCEDURE: The procedure was explained to the patient. The risks and benefits of the procedure were discussed and the patient's questions were addressed. Informed consent was obtained from the patient. The patient was placed supine on the interventional table. Ultrasound confirmed a patent right internal jugular vein. Ultrasound images were obtained for documentation. The right side of the neck was prepped and draped in a sterile fashion. The right side of the neck was anesthetized with 1% lidocaine. Maximal barrier sterile technique was utilized including caps, mask, sterile gowns, sterile gloves, sterile drape, hand hygiene and skin antiseptic. A small incision was made with #11 blade scalpel. A 21 gauge needle directed into the right internal jugular vein with ultrasound guidance. A micropuncture dilator set was placed. A 20 cm Mahurkar catheter was selected. The catheter was advanced over a wire and positioned at the superior cavoatrial junction. Fluoroscopic images were obtained for documentation. Both pheresis lumens were found to aspirate and flush well. The proper amount of heparin was flushed in both lumens. The central venous lumen was flushed with normal saline. Catheter was sutured to skin. FINDINGS: Catheter tip at the superior cavoatrial junction. IMPRESSION: Successful placement of a right jugular non-tunneled pheresis catheter using ultrasound and fluoroscopic guidance.  Electronically Signed   By: Richarda OverlieAdam  Henn M.D.   On: 01/23/2017 11:38   Kore[place] Abdomen Limited Ruq  Result Date: 01/22/2017 CLINICAL DATA:  Acute pancreatitis. EXAM: US ABDOMEN LIMITED - RIGHT UPPER QUADRANT COMPARISON:  CT scan 07/16/2016. FINDINGS: Gallbladder: No gallstones or wall thickening visualized. No sonographic Murphy sign noted by sonographer. Common bile duct: Technically difficult to visualize, but appears to measure in the 3-4 mm range. Liver: Increased echogenicity with poor acoustic through transmission, features suggesting fatty deposition.No focal lesion identified. Within normal limits in parenchymal echogenicity. IMPRESSION: Fatty liver.  Otherwise unremarkable. Electronically Signed   By: Kennith CenterEric  Mansell M.D.   On: 01/22/2017 11:10        Scheduled Meds: . therapeutic plasma exchange solution   Dialysis Once in dialysis  . calcium carbonate  2 tablet Oral Q3H  . calcium gluconate IVPB  4 g Intravenous Once  . calcium gluconate IVPB  4 g Intravenous  Once  . enoxaparin (LOVENOX) injection  40 mg Subcutaneous Q24H  . famotidine  20 mg Oral Daily  . heparin      . heparin  1,000 Units Intracatheter Once  . heparin  1,000 Units Intracatheter Once  . lidocaine (PF)      . nicotine  21 mg Transdermal Daily  . omega-3 acid ethyl esters  2 g Oral BID   Continuous Infusions: . sodium chloride 125 mL/hr at 01/23/17 0456  . citrate dextrose    . citrate dextrose       LOS: 2 days    Dovid Bartko Jaynie Collins, MD [facility] Pager [phone]  If 7PM-7AM, please contact night-coverage [email or web address] Password [email or web address] 01/23/2017, 3:12 PM

## 2017-01-23 NOTE — Consult Note (Signed)
Renal Service Consult Note [facility]  Jordan [name] 01/23/2017 [name] Requesting Physician:  Jordan [name]  Reason for Consult:  Severe hypertriglyceridemia resulting in recurrent pancreatitis HPI: The patient is a 51 y.o. year-old with history of severe hyperlipidemia with hypertriglyceridemia.  He has had several bouts of pancreatitis according to the patient over the last 5-6 yrs.  Was admitted here in 2015 once and once in 2017 for acute pancreatitis with ^^TG. We are asked to see pt for plasmapheresis.   Patient has abd pain, epigastric, not severe, no fevers or chills.  No n/v today.  No c/o's.  Pt [place] the [address] [place], [place].  Works in Office manager, is still working regularly.  +smoker. No etoh.       ROS  denies CP  no joint pain   no HA  no blurry vision  no rash  no diarrhea  no nausea/ vomiting  no dysuria  no difficulty voiding  no change in urine color    Past Medical History  Past Medical History:  Diagnosis Date  . Arthritis    right elbow pain all the time, took cortisone shot for  . Elevated blood pressure reading with diagnosis of hypertension   . GERD (gastroesophageal reflux disease)   . Pancreatitis 12-29-2013   Past Surgical History  Past Surgical History:  Procedure Laterality Date  . EUS N/A 08/12/2014   Procedure: ESOPHAGEAL ENDOSCOPIC ULTRASOUND (EUS) RADIAL;  Surgeon: Jordan Modena, MD;  Location: [facility] ENDOSCOPY;  Service: Endoscopy;  Laterality: N/A;  . FINE NEEDLE ASPIRATION N/A 08/12/2014   Procedure: FINE NEEDLE ASPIRATION (FNA) LINEAR;  Surgeon: Jordan Modena, MD;  Location: [facility] ENDOSCOPY;  Service: Endoscopy;  Laterality: N/A;  . IR GENERIC HISTORICAL  01/23/2017   IR US GUIDE VASC ACCESS RIGHT 01/23/2017 Jordan Overlie, MD [facility]  . IR GENERIC HISTORICAL  01/23/2017   IR FLUORO GUIDE CV LINE RIGHT 01/23/2017 Jordan Overlie, MD [facility]  . NO PAST SURGERIES     Family History  Family  History  Problem Relation Age of Onset  . Other Mother     Healthy  . Other Father     Healthy  . Pancreatitis Neg Hx    Social History  reports that he has been smoking Cigarettes.  He has a 38.00 pack-year smoking history. He has never used smokeless tobacco. He reports that he drinks alcohol. He reports that he does not use drugs. Allergies  Allergies  Allergen Reactions  . Fish Allergy Nausea And Vomiting and Other (See Comments)    Can eat shrimp & scallops...sometimes crab   Home medications Prior to Admission medications   Medication Sig Start Date End Date Taking? Authorizing Provider  ibuprofen (ADVIL,MOTRIN) 200 MG tablet Take 400 mg by mouth every 6 (six) hours as needed.   Yes Historical Provider, MD  Omega-3 Fatty Acids (FISH OIL PO) Take 1 capsule by mouth daily.    Yes Historical Provider, MD  RaNITidine HCl (ACID REDUCER PO) Take 1 capsule by mouth daily.   Yes Historical Provider, MD   Liver Function Tests  Recent Labs Lab 01/21/17 1916 01/22/17 0951 01/23/17 0529  AST RESULTS UNAVAILABLE DUE TO INTERFERING SUBSTANCE 66* 47*  ALT RESULTS UNAVAILABLE DUE TO INTERFERING SUBSTANCE 48 34  ALKPHOS 38 37* 40  BILITOT 4.2* 3.1* 2.1*  PROT RESULTS UNAVAILABLE DUE TO INTERFERING SUBSTANCE 6.8 7.3  ALBUMIN 4.2 4.7 4.0    Recent Labs Lab 01/21/17 1916  LIPASE  347*   CBC  Recent Labs Lab 01/21/17 1916 01/22/17 0618 01/23/17 0529 01/23/17 1213  WBC 7.9 12.2* 13.7*  --   HGB 12.9* 15.7 14.8 13.9  HCT 38.3* 43.2 42.8 41.0  MCV 81.7 79.4 79.0  --   PLT 347 356 218  --    Basic Metabolic Panel  Recent Labs Lab 01/21/17 1916 01/21/17 2125 01/21/17 2214 01/22/17 0951 01/23/17 0529 01/23/17 1213  NA 134*  --   --  129* 134* 137  K 6.8* >7.5* >7.5* 5.8* 4.3 3.4*  CL 103  --   --  99* 101 101  CO2 17*  --   --  21* 20*  --   GLUCOSE 120*  --   --  120* 108* 101*  BUN 14  --   --  8 8 8   CREATININE 0.96  --   --  1.30* 0.81 0.80  CALCIUM 9.6  --   --   8.8* 9.1  --    Iron/TIBC/Ferritin/ %Sat No results found for: IRON, TIBC, FERRITIN, IRONPCTSAT  Vitals:   01/23/17 1245 01/23/17 1258 01/23/17 1307 01/23/17 1330  BP: (!) 188/103 (!) 186/106 (!) 179/103 (!) 173/103  Pulse: 91 86 96 92  Resp: (!) 27 (!) 25 (!) 25 (!) 28  Temp:      TempSrc:      SpO2:      Weight:      Height:       Exam Gen alert,  No distress, calm, WDWN No rash, cyanosis or gangrene Sclera anicteric, throat clear  No jvd or bruits Chest clear bilat RRR no MRG Abd soft ntnd no mass or ascites +bs GU normal male MS no joint effusions or deformity Ext no LE edema / no wounds or ulcers Neuro is alert, Ox 3 , nf     Assessment: 1. Severe hypertriglyceridemia - recurrent issue 2. Acute pancreatitis - abd pain, lipase 347, mild case. Recurrent issue    Plan - Plasmapheresis, orders written for today.  No specific protocol, per UTD usually 1-2 sessions will get TG level < 500 which is endpoint.  Will follow.  Have d/w pt as to possible side effects.    Jordan Moselleob Jordan Bodkin MD [facility] pager [phone]   01/23/2017, 1:36 PM

## 2017-01-24 LAB — BASIC METABOLIC PANEL
Anion gap: 9 (ref 5–15)
BUN: 9 mg/dL (ref 6–20)
CHLORIDE: 103 mmol/L (ref 101–111)
CO2: 24 mmol/L (ref 22–32)
Calcium: 8.8 mg/dL — ABNORMAL LOW (ref 8.9–10.3)
Creatinine, Ser: 0.87 mg/dL (ref 0.61–1.24)
GFR calc Af Amer: >60 mL/min (ref >60)
GFR calc non Af Amer: >60 mL/min (ref >60)
GLUCOSE: 98 mg/dL (ref 65–99)
POTASSIUM: 3.5 mmol/L (ref 3.5–5.1)
Sodium: 136 mmol/L (ref 135–145)

## 2017-01-24 LAB — LIPID PANEL
CHOLESTEROL: 119 mg/dL (ref 0–200)
TRIGLYCERIDES: 386 mg/dL — AB (ref <150)
VLDL: 77 mg/dL — ABNORMAL HIGH (ref 0–40)

## 2017-01-24 MED ORDER — POTASSIUM CHLORIDE CRYS ER 20 MEQ PO TBCR
40.0000 meq | EXTENDED_RELEASE_TABLET | Freq: Once | ORAL | Status: AC
  Administered 2017-01-24: 40 meq via ORAL
  Filled 2017-01-24: qty 2

## 2017-01-24 MED ORDER — AMLODIPINE BESYLATE 10 MG PO TABS
10.0000 mg | ORAL_TABLET | Freq: Every day | ORAL | Status: DC
  Administered 2017-01-24 – 2017-01-26 (×3): 10 mg via ORAL
  Filled 2017-01-24 (×3): qty 1

## 2017-01-24 NOTE — Progress Notes (Signed)
[facility] PROGRESS NOTE Subjective feels better. Tolerating clear liquids  Objective: Vital signs in last 24 hours: Temp:  [97.7 F (36.5 C)-99.5 F (37.5 C)] 98.2 F (36.8 C) (03/07 0700) Pulse Rate:  [70-100] 70 (03/07 0700) Resp:  [17-34] 20 (03/07 0700) BP: (131-205)/(80-127) 174/104 (03/07 0700) SpO2:  [94 %-98 %] 98 % (03/07 0700) Weight:  [86 kg (189 lb 9.5 oz)-86.4 kg (190 lb 7.6 oz)] 86.4 kg (190 lb 7.6 oz) (03/06 1511) Last BM Date: 01/23/17  Intake/Output from previous day: 03/06 0701 - 03/07 0700 In: 1406.3 [P.O.:320; I.V.:1036.3; IV Piggyback:50] Out: 0  Intake/Output this shift: No intake/output data recorded.  PE: General-- walking around room  Abdomen-- mild abdominal pain the epigastric and good bowel sounds  Lab Results:  Recent Labs  01/21/17 1916 01/22/17 0618 01/23/17 0529 01/23/17 1213  WBC 7.9 12.2* 13.7*  --   HGB 12.9* 15.7 14.8 13.9  HCT 38.3* 43.2 42.8 41.0  PLT 347 356 218  --    BMET  Recent Labs  01/21/17 1916 01/21/17 2125 01/21/17 2214 01/22/17 0951 01/23/17 0529 01/23/17 1213  NA 134*  --   --  129* 134* 137  K 6.8* >7.5* >7.5* 5.8* 4.3 3.4*  CL 103  --   --  99* 101 101  CO2 17*  --   --  21* 20*  --   CREATININE 0.96  --   --  1.30* 0.81 0.80   LFT  Recent Labs  01/21/17 1916 01/22/17 0951 01/23/17 0529  PROT RESULTS UNAVAILABLE DUE TO INTERFERING SUBSTANCE 6.8 7.3  AST RESULTS UNAVAILABLE DUE TO INTERFERING SUBSTANCE 66* 47*  ALT RESULTS UNAVAILABLE DUE TO INTERFERING SUBSTANCE 48 34  ALKPHOS 38 37* 40  BILITOT 4.2* 3.1* 2.1*   PT/INR No results for input(s): LABPROT, INR in the last 72 hours. PANCREAS  Recent Labs  01/21/17 1916  LIPASE 347*         Studies/Results: Ct Abdomen Pelvis W Contrast  Result Date: 01/24/2017 CLINICAL DATA:  Epigastric pain with nausea, vomiting and diarrhea for 3 days. History of pancreatitis. EXAM: CT ABDOMEN AND PELVIS WITH CONTRAST TECHNIQUE:  Multidetector CT imaging of the abdomen and pelvis was performed using the standard protocol following bolus administration of intravenous contrast. CONTRAST:  100 ISOVUE-300 IOPAMIDOL (ISOVUE-300) INJECTION 61% COMPARISON:  07/16/2016 and 03/02/2015. FINDINGS: Lower chest: Respiratory motion degrades image quality. Dependent atelectasis in the right lower lobe. Collapse/consolidation in the lingula and left lower lobe adjacent to an elevated left hemidiaphragm. Heart size normal. No pericardial or pleural effusion. Hepatobiliary: Liver is decreased in attenuation diffusely. Liver and gallbladder are otherwise unremarkable. No biliary ductal dilatation. Pancreas: The pancreatic head has a somewhat bullous appearance with blunting of the uncinate process, similar to the prior exam. There is haziness and a small amount of fluid in the surrounding fat with probable slight thickening of the adjacent duodenum. There may be mild ductal dilatation in the head of the pancreas is well. Remainder the pancreas is unremarkable. Spleen: Negative. Adrenals/Urinary Tract: Adrenal glands and kidneys are unremarkable. Ureters are decompressed. Bladder is grossly unremarkable. Stomach/Bowel: Tiny diverticulum off the posterior stomach. Stomach is decompressed. Mild thickening and inflammatory changes involving the horizontal portion of the duodenum. Small bowel, appendix and colon are otherwise unremarkable. Vascular/Lymphatic: Atherosclerotic calcification of the arterial vasculature without abdominal aortic aneurysm. No pathologically enlarged lymph nodes. Reproductive: Prostate is visualized. Other: Small bilateral inguinal hernias contain fat. A knuckle of small bowel is seen within the right inguinal  hernia. No free fluid. Mesenteries and peritoneum are otherwise unremarkable. Musculoskeletal: No worrisome lytic or sclerotic lesions. A probable 2.0 cm sebaceous cyst overlies the lower thoracic spine. IMPRESSION: 1. Acute  pancreatitis with secondary inflammation of the duodenum. 2. Hepatic steatosis. 3. Collapse/consolidation in the lingula and left lower lobe. Difficult to exclude pneumonia. 4.  Aortic atherosclerosis (ICD10-170.0). Electronically Signed   By: Leanna Battles M.D.   On: 01/24/2017 07:34   Ir Fluoro Guide Cv Line Right  Result Date: 01/23/2017 INDICATION: 51 year old with hypertriglyceridemia. Patient needs a catheter for pheresis. EXAM: FLUOROSCOPIC AND ULTRASOUND GUIDED PLACEMENT OF A NON-TUNNELED PHERESIS CATHETER Physician: Rachelle Hora. Henn, MD MEDICATIONS: None ANESTHESIA/SEDATION: None FLUOROSCOPY TIME:  Fluoroscopy Time: 24 seconds, 1.4 mGy COMPLICATIONS: None immediate. PROCEDURE: The procedure was explained to the patient. The risks and benefits of the procedure were discussed and the patient's questions were addressed. Informed consent was obtained from the patient. The patient was placed supine on the interventional table. Ultrasound confirmed a patent right internal jugular vein. Ultrasound images were obtained for documentation. The right side of the neck was prepped and draped in a sterile fashion. The right side of the neck was anesthetized with 1% lidocaine. Maximal barrier sterile technique was utilized including caps, mask, sterile gowns, sterile gloves, sterile drape, hand hygiene and skin antiseptic. A small incision was made with #11 blade scalpel. A 21 gauge needle directed into the right internal jugular vein with ultrasound guidance. A micropuncture dilator set was placed. A 20 cm Mahurkar catheter was selected. The catheter was advanced over a wire and positioned at the superior cavoatrial junction. Fluoroscopic images were obtained for documentation. Both pheresis lumens were found to aspirate and flush well. The proper amount of heparin was flushed in both lumens. The central venous lumen was flushed with normal saline. Catheter was sutured to skin. FINDINGS: Catheter tip at the superior  cavoatrial junction. IMPRESSION: Successful placement of a right jugular non-tunneled pheresis catheter using ultrasound and fluoroscopic guidance. Electronically Signed   By: [name] M.D.   On: 01/23/2017 11:38   Ir US Guide Vasc Access Right  Result Date: 01/23/2017 INDICATION: 51 year old with hypertriglyceridemia. Patient needs a catheter for pheresis. EXAM: FLUOROSCOPIC AND ULTRASOUND GUIDED PLACEMENT OF A NON-TUNNELED PHERESIS CATHETER Physician: Rachelle Hora. Henn, MD MEDICATIONS: None ANESTHESIA/SEDATION: None FLUOROSCOPY TIME:  Fluoroscopy Time: 24 seconds, 1.4 mGy COMPLICATIONS: None immediate. PROCEDURE: The procedure was explained to the patient. The risks and benefits of the procedure were discussed and the patient's questions were addressed. Informed consent was obtained from the patient. The patient was placed supine on the interventional table. Ultrasound confirmed a patent right internal jugular vein. Ultrasound images were obtained for documentation. The right side of the neck was prepped and draped in a sterile fashion. The right side of the neck was anesthetized with 1% lidocaine. Maximal barrier sterile technique was utilized including caps, mask, sterile gowns, sterile gloves, sterile drape, hand hygiene and skin antiseptic. A small incision was made with #11 blade scalpel. A 21 gauge needle directed into the right internal jugular vein with ultrasound guidance. A micropuncture dilator set was placed. A 20 cm Mahurkar catheter was selected. The catheter was advanced over a wire and positioned at the superior cavoatrial junction. Fluoroscopic images were obtained for documentation. Both pheresis lumens were found to aspirate and flush well. The proper amount of heparin was flushed in both lumens. The central venous lumen was flushed with normal saline. Catheter was sutured to skin. FINDINGS: Catheter tip at  the superior cavoatrial junction. IMPRESSION: Successful placement of a right jugular  non-tunneled pheresis catheter using ultrasound and fluoroscopic guidance. Electronically Signed   By: Richarda OverlieAdam  Henn M.D.   On: 01/23/2017 11:38   [place] Abdomen Limited Ruq  Result Date: 01/22/2017 CLINICAL DATA:  Acute pancreatitis. EXAM: US ABDOMEN LIMITED - RIGHT UPPER QUADRANT COMPARISON:  CT scan 07/16/2016. FINDINGS: Gallbladder: No gallstones or wall thickening visualized. No sonographic Murphy sign noted by sonographer. Common bile duct: Technically difficult to visualize, but appears to measure in the 3-4 mm range. Liver: Increased echogenicity with poor acoustic through transmission, features suggesting fatty deposition.No focal lesion identified. Within normal limits in parenchymal echogenicity. IMPRESSION: Fatty liver.  Otherwise unremarkable. Electronically Signed   By: Kennith CenterEric  Mansell M.D.   On: 01/22/2017 11:10    Medications: I have reviewed the patient's current medications.  Assessment/Plan: 1. Acute pancreatitis secondary to hypertriglyceridemia. Triglycerides down to a low range after plasmapheresis. Patient started on fenofibrate. At this point I would continue that medication and clear liquids with slow advanced to include baked potatoes without butter noodles etc. He will need follow-up with endocrinologist to evaluate for other contributing causes of his hypertriglyceridemia. Have discussed with him the need to find a way to pay for the medications to lower his triglycerides.   Magaret Justo JR,Kellin Fifer L 01/24/2017, 7:55 AM  This note was created using voice recognition software. Minor errors may Have occurred unintentionally.  Pager: [phone] If no answer or after hours call [phone]

## 2017-01-24 NOTE — Care Management Note (Signed)
Case Management Note  Patient Details  Name: Jordan [name] MRN: [id number] Date of Birth: 05-05-1966  Subjective/Objective:        CM following for progression and d/c planning.             Action/Plan: 01/24/2017 Noted pt having difficulty paying for meds, however this pt is insured therefore we are unable to assist with medications. Will recommend that pt visit [employer] for applications for assistance with meds. Pt financial status will determine his eligibility for an assistance program.   Expected Discharge Date:                  Expected Discharge Plan:  Home/Self Care  In-House Referral:  NA  Discharge planning Services  CM Consult, Medication Assistance  Post Acute Care Choice:  NA Choice offered to:  NA  DME Arranged:  N/A DME Agency:  NA  HH Arranged:  NA HH Agency:  NA  Status of Service:  Completed, signed off  If discussed at Long Length of Stay Meetings, dates discussed:    Additional Comments:  Jordan [name], Jordan Kohles U, RN 01/24/2017, 11:08 AM

## 2017-01-24 NOTE — Progress Notes (Signed)
Patient's TG level down to 300-400 levels last night and this am.  No indication for further pheresis at this time.  Will sign off.  Temp HD cath can be removed.    Vinson Moselleob Amaro Mangold MD [facility] pgr [phone]   01/24/2017, 1:34 PM

## 2017-01-24 NOTE — Progress Notes (Signed)
PROGRESS NOTE                                                                                                                                                                                                             Patient Demographics:    Jordan [name], is a 51 y.o. male, DOB - Oct 23, 1966, WJX:[id number]  Admit date - 01/21/2017   Admitting Physician Jonah Blue, MD  Outpatient Primary MD for the patient is No PCP Per Patient  LOS - 3  Outpatient Specialists:None  Chief Complaint  Patient presents with  . Abdominal Pain  . Emesis       Brief Narrative   51 year old male with history of pancreatitis secondary to hypertriglyceridemia and hypertension presented with 3-4 days of abdominal pain associated with nausea and vomiting. He was hospitalized in September 2017 for similar symptoms and was discharged on fenofibrate but did not feel them due to affordability. (He did not have insurance at that time). He was taking 1 fish oil daily. Blood work done showed lipase of 347, WBC of 12.2 K and triglycerides of 4891. Patient admitted to [facility] and transferred to [facility] for plasmapheresis.   Subjective:   Patient reports mild epigastric discomfort. No nausea or vomiting.   Assessment  & Plan :    Principal Problem:   Acute pancreatitis -Secondary to severe hypertriglyceridemia. Central line placed and Patient received plasmapheresis x1. Triglycerides have improved continue to 300s today. -CT abdomen and pelvis on admission showing acute pancreatitis and mild duodenitis. -[facility] following. Recommend clear liquid diet with slow advance as tolerated. Continue hydration and pain medications. Discussed with nephrology. Holding off on further plasmapheresis. -Patient will need to follow-up as outpatient closely. He has been started on fibrate. With his new insurance she should be able to afford the medication. I  will arrange an appt with endocrinologist Dr Sharl Ma .  Active Problems:   Tobacco use Counseled on cessation. Nicotine patch.  Essential hypertension Not on any medications. I started him on amlodipine daily.   Hyperkalemia/Hypokalemia Corrected/ Replenished  Code Status : Full code  Family Communication  : None at bedside  Disposition Plan  : Home once clinically improved and tolerating advanced diet  Barriers For Discharge : Improving symptoms  Consults  :   [facility]  Procedures  :  Central  line placement and plasmapheresis  DVT Prophylaxis  :  Heparin  Lab Results  Component Value Date   PLT 218 01/23/2017    Antibiotics  :    Anti-infectives    None        Objective:   Vitals:   01/23/17 1653 01/23/17 2224 01/24/17 0700 01/24/17 0939  BP: 137/81 131/80 (!) 174/104 134/84  Pulse: 97 78 70 82  Resp: (!) 24 18 20 18   Temp: 98.3 F (36.8 C) 99.5 F (37.5 C) 98.2 F (36.8 C) 98.5 F (36.9 C)  TempSrc: Oral Oral Oral Oral  SpO2: 95% 98% 98% 100%  Weight:      Height:        Wt Readings from Last 3 Encounters:  01/23/17 86.4 kg (190 lb 7.6 oz)  08/17/16 85.1 kg (187 lb 9.6 oz)  08/14/16 83.9 kg (185 lb)     Intake/Output Summary (Last 24 hours) at 01/24/17 1146 Last data filed at 01/24/17 0900  Gross per 24 hour  Intake          1886.25 ml  Output                0 ml  Net          1886.25 ml     Physical Exam  Gen: not in distress HEENT: no pallor, moist mucosa, supple neck Chest: clear b/l, no added sounds CVS: N S1&S2, no murmurs, rubs or gallop GI: soft, NT, ND, BS+ Musculoskeletal: warm, no edema CNS: AAOX3, non focal    Data Review:    CBC  Recent Labs Lab 01/21/17 1916 01/22/17 0618 01/23/17 0529 01/23/17 1213  WBC 7.9 12.2* 13.7*  --   HGB 12.9* 15.7 14.8 13.9  HCT 38.3* 43.2 42.8 41.0  PLT 347 356 218  --   MCV 81.7 79.4 79.0  --   MCH 27.5 28.9 27.3  --   MCHC 33.7 36.3* 34.6  --   RDW 16.3* 15.6*  14.4  --     Chemistries   Recent Labs Lab 01/21/17 1916  01/21/17 2214 01/22/17 0951 01/23/17 0529 01/23/17 1213 01/24/17 0738  NA 134*  --   --  129* 134* 137 136  K 6.8*  < > >7.5* 5.8* 4.3 3.4* 3.5  CL 103  --   --  99* 101 101 103  CO2 17*  --   --  21* 20*  --  24  GLUCOSE 120*  --   --  120* 108* 101* 98  BUN 14  --   --  8 8 8 9   CREATININE 0.96  --   --  1.30* 0.81 0.80 0.87  CALCIUM 9.6  --   --  8.8* 9.1  --  8.8*  AST RESULTS UNAVAILABLE DUE TO INTERFERING SUBSTANCE  --   --  66* 47*  --   --   ALT RESULTS UNAVAILABLE DUE TO INTERFERING SUBSTANCE  --   --  48 34  --   --   ALKPHOS 38  --   --  37* 40  --   --   BILITOT 4.2*  --   --  3.1* 2.1*  --   --   < > = values in this interval not displayed. ------------------------------------------------------------------------------------------------------------------  Recent Labs  01/23/17 2028 01/24/17 0738  CHOL  --  119  HDL  --  <10*  LDLCALC  --  NOT CALCULATED  TRIG 312* 386*  CHOLHDL  --  NOT  CALCULATED    Lab Results  Component Value Date   HGBA1C 6.0 (H) 12/30/2013   ------------------------------------------------------------------------------------------------------------------ No results for input(s): TSH, T4TOTAL, T3FREE, THYROIDAB in the last 72 hours.  Invalid input(s): FREET3 ------------------------------------------------------------------------------------------------------------------ No results for input(s): VITAMINB12, FOLATE, FERRITIN, TIBC, IRON, RETICCTPCT in the last 72 hours.  Coagulation profile No results for input(s): INR, PROTIME in the last 168 hours.  No results for input(s): DDIMER in the last 72 hours.  Cardiac Enzymes No results for input(s): CKMB, TROPONINI, MYOGLOBIN in the last 168 hours.  Invalid input(s): CK ------------------------------------------------------------------------------------------------------------------ No results found for: BNP  Inpatient  Medications  Scheduled Meds: . enoxaparin (LOVENOX) injection  40 mg Subcutaneous Q24H  . famotidine (PEPCID) IV  20 mg Intravenous Q12H  . fenofibrate  160 mg Oral Daily  . nicotine  21 mg Transdermal Daily  . omega-3 acid ethyl esters  2 g Oral BID   Continuous Infusions: . dextrose 5 % and 0.9 % NaCl with KCl 20 mEq/L 75 mL/hr at 01/24/17 0617   PRN Meds:.hydrALAZINE, HYDROmorphone (DILAUDID) injection, ketorolac, lidocaine (PF), ondansetron **OR** ondansetron (ZOFRAN) IV  Micro Results No results found for this or any previous visit (from the past 240 hour(s)).  Radiology Reports Ct Abdomen Pelvis W Contrast  Result Date: 01/24/2017 CLINICAL DATA:  Epigastric pain with nausea, vomiting and diarrhea for 3 days. History of pancreatitis. EXAM: CT ABDOMEN AND PELVIS WITH CONTRAST TECHNIQUE: Multidetector CT imaging of the abdomen and pelvis was performed using the standard protocol following bolus administration of intravenous contrast. CONTRAST:  100 ISOVUE-300 IOPAMIDOL (ISOVUE-300) INJECTION 61% COMPARISON:  07/16/2016 and 03/02/2015. FINDINGS: Lower chest: Respiratory motion degrades image quality. Dependent atelectasis in the right lower lobe. Collapse/consolidation in the lingula and left lower lobe adjacent to an elevated left hemidiaphragm. Heart size normal. No pericardial or pleural effusion. Hepatobiliary: Liver is decreased in attenuation diffusely. Liver and gallbladder are otherwise unremarkable. No biliary ductal dilatation. Pancreas: The pancreatic head has a somewhat bullous appearance with blunting of the uncinate process, similar to the prior exam. There is haziness and a small amount of fluid in the surrounding fat with probable slight thickening of the adjacent duodenum. There may be mild ductal dilatation in the head of the pancreas is well. Remainder the pancreas is unremarkable. Spleen: Negative. Adrenals/Urinary Tract: Adrenal glands and kidneys are unremarkable. Ureters  are decompressed. Bladder is grossly unremarkable. Stomach/Bowel: Tiny diverticulum off the posterior stomach. Stomach is decompressed. Mild thickening and inflammatory changes involving the horizontal portion of the duodenum. Small bowel, appendix and colon are otherwise unremarkable. Vascular/Lymphatic: Atherosclerotic calcification of the arterial vasculature without abdominal aortic aneurysm. No pathologically enlarged lymph nodes. Reproductive: Prostate is visualized. Other: Small bilateral inguinal hernias contain fat. A knuckle of small bowel is seen within the right inguinal hernia. No free fluid. Mesenteries and peritoneum are otherwise unremarkable. Musculoskeletal: No worrisome lytic or sclerotic lesions. A probable 2.0 cm sebaceous cyst overlies the lower thoracic spine. IMPRESSION: 1. Acute pancreatitis with secondary inflammation of the duodenum. 2. Hepatic steatosis. 3. Collapse/consolidation in the lingula and left lower lobe. Difficult to exclude pneumonia. 4.  Aortic atherosclerosis (ICD10-170.0). Electronically Signed   By: Leanna BattlesMelinda  Blietz M.D.   On: 01/24/2017 07:34   Ir Fluoro Guide Cv Line Right  Result Date: 01/23/2017 INDICATION: 51 year old with hypertriglyceridemia. Patient needs a catheter for pheresis. EXAM: FLUOROSCOPIC AND ULTRASOUND GUIDED PLACEMENT OF A NON-TUNNELED PHERESIS CATHETER Physician: Rachelle HoraAdam R. Lowella DandyHenn, MD MEDICATIONS: None ANESTHESIA/SEDATION: None FLUOROSCOPY TIME:  Fluoroscopy Time: 24 seconds, 1.4  mGy COMPLICATIONS: None immediate. PROCEDURE: The procedure was explained to the patient. The risks and benefits of the procedure were discussed and the patient's questions were addressed. Informed consent was obtained from the patient. The patient was placed supine on the interventional table. Ultrasound confirmed a patent right internal jugular vein. Ultrasound images were obtained for documentation. The right side of the neck was prepped and draped in a sterile fashion. The  right side of the neck was anesthetized with 1% lidocaine. Maximal barrier sterile technique was utilized including caps, mask, sterile gowns, sterile gloves, sterile drape, hand hygiene and skin antiseptic. A small incision was made with #11 blade scalpel. A 21 gauge needle directed into the right internal jugular vein with ultrasound guidance. A micropuncture dilator set was placed. A 20 cm Mahurkar catheter was selected. The catheter was advanced over a wire and positioned at the superior cavoatrial junction. Fluoroscopic images were obtained for documentation. Both pheresis lumens were found to aspirate and flush well. The proper amount of heparin was flushed in both lumens. The central venous lumen was flushed with normal saline. Catheter was sutured to skin. FINDINGS: Catheter tip at the superior cavoatrial junction. IMPRESSION: Successful placement of a right jugular non-tunneled pheresis catheter using ultrasound and fluoroscopic guidance. Electronically Signed   By: [name] M.D.   On: 01/23/2017 11:38   Ir US Guide Vasc Access Right  Result Date: 01/23/2017 INDICATION: 51 year old with hypertriglyceridemia. Patient needs a catheter for pheresis. EXAM: FLUOROSCOPIC AND ULTRASOUND GUIDED PLACEMENT OF A NON-TUNNELED PHERESIS CATHETER Physician: Rachelle Hora. Henn, MD MEDICATIONS: None ANESTHESIA/SEDATION: None FLUOROSCOPY TIME:  Fluoroscopy Time: 24 seconds, 1.4 mGy COMPLICATIONS: None immediate. PROCEDURE: The procedure was explained to the patient. The risks and benefits of the procedure were discussed and the patient's questions were addressed. Informed consent was obtained from the patient. The patient was placed supine on the interventional table. Ultrasound confirmed a patent right internal jugular vein. Ultrasound images were obtained for documentation. The right side of the neck was prepped and draped in a sterile fashion. The right side of the neck was anesthetized with 1% lidocaine. Maximal barrier  sterile technique was utilized including caps, mask, sterile gowns, sterile gloves, sterile drape, hand hygiene and skin antiseptic. A small incision was made with #11 blade scalpel. A 21 gauge needle directed into the right internal jugular vein with ultrasound guidance. A micropuncture dilator set was placed. A 20 cm Mahurkar catheter was selected. The catheter was advanced over a wire and positioned at the superior cavoatrial junction. Fluoroscopic images were obtained for documentation. Both pheresis lumens were found to aspirate and flush well. The proper amount of heparin was flushed in both lumens. The central venous lumen was flushed with normal saline. Catheter was sutured to skin. FINDINGS: Catheter tip at the superior cavoatrial junction. IMPRESSION: Successful placement of a right jugular non-tunneled pheresis catheter using ultrasound and fluoroscopic guidance. Electronically Signed   By: [name] M.D.   On: 01/23/2017 11:38   US Abdomen Limited Ruq  Result Date: 01/22/2017 CLINICAL DATA:  Acute pancreatitis. EXAM: US ABDOMEN LIMITED - RIGHT UPPER QUADRANT COMPARISON:  CT scan 07/16/2016. FINDINGS: Gallbladder: No gallstones or wall thickening visualized. No sonographic Murphy sign noted by sonographer. Common bile duct: Technically difficult to visualize, but appears to measure in the 3-4 mm range. Liver: Increased echogenicity with poor acoustic through transmission, features suggesting fatty deposition.No focal lesion identified. Within normal limits in parenchymal echogenicity. IMPRESSION: Fatty liver.  Otherwise unremarkable. Electronically Signed  By: Kennith Center M.D.   On: 01/22/2017 11:10    Time Spent in minutes  25   Eddie North M.D on 01/24/2017 at 11:46 AM  Between 7am to 7pm - Pager - [phone]  After 7pm go to [email or web address] - password [facility]  [facility] -  Office  [phone]

## 2017-01-25 DIAGNOSIS — K8500 Idiopathic acute pancreatitis without necrosis or infection: Secondary | ICD-10-CM

## 2017-01-25 LAB — BASIC METABOLIC PANEL
Anion gap: 7 (ref 5–15)
BUN: 8 mg/dL (ref 6–20)
CO2: 23 mmol/L (ref 22–32)
Calcium: 8.7 mg/dL — ABNORMAL LOW (ref 8.9–10.3)
Chloride: 106 mmol/L (ref 101–111)
Creatinine, Ser: 0.8 mg/dL (ref 0.61–1.24)
GFR calc Af Amer: >60 mL/min (ref >60)
GFR calc non Af Amer: >60 mL/min (ref >60)
Glucose, Bld: 109 mg/dL — ABNORMAL HIGH (ref 65–99)
Potassium: 3.9 mmol/L (ref 3.5–5.1)
Sodium: 136 mmol/L (ref 135–145)

## 2017-01-25 LAB — TRIGLYCERIDES: Triglycerides: 448 mg/dL — ABNORMAL HIGH (ref <150)

## 2017-01-25 MED ORDER — FAMOTIDINE 20 MG PO TABS
20.0000 mg | ORAL_TABLET | Freq: Two times a day (BID) | ORAL | Status: DC
  Administered 2017-01-25 – 2017-01-26 (×2): 20 mg via ORAL
  Filled 2017-01-25 (×2): qty 1

## 2017-01-25 NOTE — Progress Notes (Signed)
PROGRESS NOTE                                                                                                                                                                                                             Patient Demographics:    Jordan [name], is a 51 y.o. male, DOB - August 06, 1966, ZOX:[id number]  Admit date - 01/21/2017   Admitting Physician Jonah Blue, MD  Outpatient Primary MD for the patient is No PCP Per Patient  LOS - 4  Outpatient Specialists:None  Chief Complaint  Patient presents with  . Abdominal Pain  . Emesis       Brief Narrative   52 year old male with history of pancreatitis secondary to hypertriglyceridemia and hypertension presented with 3-4 days of abdominal pain associated with nausea and vomiting. He was hospitalized in September 2017 for similar symptoms and was discharged on fenofibrate but did not feel them due to affordability. (He did not have insurance at that time). He was taking 1 fish oil daily. Blood work done showed lipase of 347, WBC of 12.2 K and triglycerides of 4891. Patient admitted to [facility] and transferred to [facility] for plasmapheresis.   Subjective:   Mild epigastric pain but no nausea or vomiting. Once advanced diet.  Assessment  & Plan :    Principal Problem:   Acute pancreatitis -Secondary to severe hypertriglyceridemia. Central line placed and Patient received plasmapheresis x1. Triglycerides have improved ( <500 today) -CT abdomen and pelvis on admission showing acute pancreatitis and mild duodenitis. -[facility] following. Recommend clear liquid diet with slow advance as tolerated. Continue hydration and pain medications. No further need for plasmapheresis. Discontinue is to catheter.  -Patient will need to follow-up as outpatient closely. He has been started on fibrate and lovaza. Will confirm with pharmacy on cost. I will arrange an appt with  endocrinologist Dr Sharl Ma .  Active Problems:   Tobacco use Counseled on cessation. Nicotine patch.  Essential hypertension Not on any medications. I started him on amlodipine daily.   Hyperkalemia/Hypokalemia Corrected/ Replenished  Code Status : Full code  Family Communication  : wife at bedside  Disposition Plan  :advance det to soft for dinner if stable.  Home once clinically improved and tolerating advanced diet, possibly in 24-48 hrs  Barriers For Discharge : Improving symptoms  Consults  :  [facility]  Procedures  :  Central line placement and plasmapheresis  DVT Prophylaxis  :  Heparin  Lab Results  Component Value Date   PLT 218 01/23/2017    Antibiotics  :    Anti-infectives    None        Objective:   Vitals:   01/24/17 1734 01/24/17 2235 01/25/17 0542 01/25/17 1000  BP: 134/86 133/76 132/79 137/77  Pulse: 75 68 63 67  Resp: 18 18 16 18   Temp: 98.3 F (36.8 C) 99.4 F (37.4 C) 99.2 F (37.3 C) 98.2 F (36.8 C)  TempSrc: Oral Oral Oral Oral  SpO2: 100% 100% 97% 99%  Weight:  88.9 kg (195 lb 14.4 oz)    Height:        Wt Readings from Last 3 Encounters:  01/24/17 88.9 kg (195 lb 14.4 oz)  08/17/16 85.1 kg (187 lb 9.6 oz)  08/14/16 83.9 kg (185 lb)     Intake/Output Summary (Last 24 hours) at 01/25/17 1217 Last data filed at 01/25/17 0900  Gross per 24 hour  Intake          3358.75 ml  Output              400 ml  Net          2958.75 ml     Physical Exam  Gen: not in distress HEENT: moist mucosa, supple neck Chest: clear b/l, no added sounds CVS: N S1&S2, no murmurs GI: soft, mild epigastric tenderness, ND Musculoskeletal: warm, no edema     Data Review:    CBC  Recent Labs Lab 01/21/17 1916 01/22/17 0618 01/23/17 0529 01/23/17 1213  WBC 7.9 12.2* 13.7*  --   HGB 12.9* 15.7 14.8 13.9  HCT 38.3* 43.2 42.8 41.0  PLT 347 356 218  --   MCV 81.7 79.4 79.0  --   MCH 27.5 28.9 27.3  --   MCHC 33.7 36.3*  34.6  --   RDW 16.3* 15.6* 14.4  --     Chemistries   Recent Labs Lab 01/21/17 1916  01/22/17 0951 01/23/17 0529 01/23/17 1213 01/24/17 0738 01/25/17 0703  NA 134*  --  129* 134* 137 136 136  K 6.8*  < > 5.8* 4.3 3.4* 3.5 3.9  CL 103  --  99* 101 101 103 106  CO2 17*  --  21* 20*  --  24 23  GLUCOSE 120*  --  120* 108* 101* 98 109*  BUN 14  --  8 8 8 9 8   CREATININE 0.96  --  1.30* 0.81 0.80 0.87 0.80  CALCIUM 9.6  --  8.8* 9.1  --  8.8* 8.7*  AST RESULTS UNAVAILABLE DUE TO INTERFERING SUBSTANCE  --  66* 47*  --   --   --   ALT RESULTS UNAVAILABLE DUE TO INTERFERING SUBSTANCE  --  48 34  --   --   --   ALKPHOS 38  --  37* 40  --   --   --   BILITOT 4.2*  --  3.1* 2.1*  --   --   --   < > = values in this interval not displayed. ------------------------------------------------------------------------------------------------------------------  Recent Labs  01/24/17 0738 01/25/17 0703  CHOL 119  --   HDL <10*  --   LDLCALC NOT CALCULATED  --   TRIG 386* 448*  CHOLHDL NOT CALCULATED  --     Lab Results  Component Value Date  HGBA1C 6.0 (H) 12/30/2013   ------------------------------------------------------------------------------------------------------------------ No results for input(s): TSH, T4TOTAL, T3FREE, THYROIDAB in the last 72 hours.  Invalid input(s): FREET3 ------------------------------------------------------------------------------------------------------------------ No results for input(s): VITAMINB12, FOLATE, FERRITIN, TIBC, IRON, RETICCTPCT in the last 72 hours.  Coagulation profile No results for input(s): INR, PROTIME in the last 168 hours.  No results for input(s): DDIMER in the last 72 hours.  Cardiac Enzymes No results for input(s): CKMB, TROPONINI, MYOGLOBIN in the last 168 hours.  Invalid input(s): CK ------------------------------------------------------------------------------------------------------------------ No results found for:  BNP  Inpatient Medications  Scheduled Meds: . amLODipine  10 mg Oral Daily  . enoxaparin (LOVENOX) injection  40 mg Subcutaneous Q24H  . famotidine  20 mg Oral BID  . fenofibrate  160 mg Oral Daily  . nicotine  21 mg Transdermal Daily  . omega-3 acid ethyl esters  2 g Oral BID   Continuous Infusions: . dextrose 5 % and 0.9 % NaCl with KCl 20 mEq/L 75 mL/hr at 01/24/17 0617   PRN Meds:.hydrALAZINE, HYDROmorphone (DILAUDID) injection, ketorolac, lidocaine (PF), ondansetron **OR** ondansetron (ZOFRAN) IV  Micro Results No results found for this or any previous visit (from the past 240 hour(s)).  Radiology Reports Ct Abdomen Pelvis W Contrast  Result Date: 01/24/2017 CLINICAL DATA:  Epigastric pain with nausea, vomiting and diarrhea for 3 days. History of pancreatitis. EXAM: CT ABDOMEN AND PELVIS WITH CONTRAST TECHNIQUE: Multidetector CT imaging of the abdomen and pelvis was performed using the standard protocol following bolus administration of intravenous contrast. CONTRAST:  100 ISOVUE-300 IOPAMIDOL (ISOVUE-300) INJECTION 61% COMPARISON:  07/16/2016 and 03/02/2015. FINDINGS: Lower chest: Respiratory motion degrades image quality. Dependent atelectasis in the right lower lobe. Collapse/consolidation in the lingula and left lower lobe adjacent to an elevated left hemidiaphragm. Heart size normal. No pericardial or pleural effusion. Hepatobiliary: Liver is decreased in attenuation diffusely. Liver and gallbladder are otherwise unremarkable. No biliary ductal dilatation. Pancreas: The pancreatic head has a somewhat bullous appearance with blunting of the uncinate process, similar to the prior exam. There is haziness and a small amount of fluid in the surrounding fat with probable slight thickening of the adjacent duodenum. There may be mild ductal dilatation in the head of the pancreas is well. Remainder the pancreas is unremarkable. Spleen: Negative. Adrenals/Urinary Tract: Adrenal glands and  kidneys are unremarkable. Ureters are decompressed. Bladder is grossly unremarkable. Stomach/Bowel: Tiny diverticulum off the posterior stomach. Stomach is decompressed. Mild thickening and inflammatory changes involving the horizontal portion of the duodenum. Small bowel, appendix and colon are otherwise unremarkable. Vascular/Lymphatic: Atherosclerotic calcification of the arterial vasculature without abdominal aortic aneurysm. No pathologically enlarged lymph nodes. Reproductive: Prostate is visualized. Other: Small bilateral inguinal hernias contain fat. A knuckle of small bowel is seen within the right inguinal hernia. No free fluid. Mesenteries and peritoneum are otherwise unremarkable. Musculoskeletal: No worrisome lytic or sclerotic lesions. A probable 2.0 cm sebaceous cyst overlies the lower thoracic spine. IMPRESSION: 1. Acute pancreatitis with secondary inflammation of the duodenum. 2. Hepatic steatosis. 3. Collapse/consolidation in the lingula and left lower lobe. Difficult to exclude pneumonia. 4.  Aortic atherosclerosis (ICD10-170.0). Electronically Signed   By: Leanna Battles M.D.   On: 01/24/2017 07:34   Ir Fluoro Guide Cv Line Right  Result Date: 01/23/2017 INDICATION: 51 year old with hypertriglyceridemia. Patient needs a catheter for pheresis. EXAM: FLUOROSCOPIC AND ULTRASOUND GUIDED PLACEMENT OF A NON-TUNNELED PHERESIS CATHETER Physician: Rachelle Hora. Henn, MD MEDICATIONS: None ANESTHESIA/SEDATION: None FLUOROSCOPY TIME:  Fluoroscopy Time: 24 seconds, 1.4 mGy COMPLICATIONS: None immediate. PROCEDURE: The  procedure was explained to the patient. The risks and benefits of the procedure were discussed and the patient's questions were addressed. Informed consent was obtained from the patient. The patient was placed supine on the interventional table. Ultrasound confirmed a patent right internal jugular vein. Ultrasound images were obtained for documentation. The right side of the neck was prepped and  draped in a sterile fashion. The right side of the neck was anesthetized with 1% lidocaine. Maximal barrier sterile technique was utilized including caps, mask, sterile gowns, sterile gloves, sterile drape, hand hygiene and skin antiseptic. A small incision was made with #11 blade scalpel. A 21 gauge needle directed into the right internal jugular vein with ultrasound guidance. A micropuncture dilator set was placed. A 20 cm Mahurkar catheter was selected. The catheter was advanced over a wire and positioned at the superior cavoatrial junction. Fluoroscopic images were obtained for documentation. Both pheresis lumens were found to aspirate and flush well. The proper amount of heparin was flushed in both lumens. The central venous lumen was flushed with normal saline. Catheter was sutured to skin. FINDINGS: Catheter tip at the superior cavoatrial junction. IMPRESSION: Successful placement of a right jugular non-tunneled pheresis catheter using ultrasound and fluoroscopic guidance. Electronically Signed   By: [name] M.D.   On: 01/23/2017 11:38   Ir US Guide Vasc Access Right  Result Date: 01/23/2017 INDICATION: 51 year old with hypertriglyceridemia. Patient needs a catheter for pheresis. EXAM: FLUOROSCOPIC AND ULTRASOUND GUIDED PLACEMENT OF A NON-TUNNELED PHERESIS CATHETER Physician: Rachelle Hora. Henn, MD MEDICATIONS: None ANESTHESIA/SEDATION: None FLUOROSCOPY TIME:  Fluoroscopy Time: 24 seconds, 1.4 mGy COMPLICATIONS: None immediate. PROCEDURE: The procedure was explained to the patient. The risks and benefits of the procedure were discussed and the patient's questions were addressed. Informed consent was obtained from the patient. The patient was placed supine on the interventional table. Ultrasound confirmed a patent right internal jugular vein. Ultrasound images were obtained for documentation. The right side of the neck was prepped and draped in a sterile fashion. The right side of the neck was anesthetized  with 1% lidocaine. Maximal barrier sterile technique was utilized including caps, mask, sterile gowns, sterile gloves, sterile drape, hand hygiene and skin antiseptic. A small incision was made with #11 blade scalpel. A 21 gauge needle directed into the right internal jugular vein with ultrasound guidance. A micropuncture dilator set was placed. A 20 cm Mahurkar catheter was selected. The catheter was advanced over a wire and positioned at the superior cavoatrial junction. Fluoroscopic images were obtained for documentation. Both pheresis lumens were found to aspirate and flush well. The proper amount of heparin was flushed in both lumens. The central venous lumen was flushed with normal saline. Catheter was sutured to skin. FINDINGS: Catheter tip at the superior cavoatrial junction. IMPRESSION: Successful placement of a right jugular non-tunneled pheresis catheter using ultrasound and fluoroscopic guidance. Electronically Signed   By: [name] M.D.   On: 01/23/2017 11:38   US Abdomen Limited Ruq  Result Date: 01/22/2017 CLINICAL DATA:  Acute pancreatitis. EXAM: US ABDOMEN LIMITED - RIGHT UPPER QUADRANT COMPARISON:  CT scan 07/16/2016. FINDINGS: Gallbladder: No gallstones or wall thickening visualized. No sonographic Murphy sign noted by sonographer. Common bile duct: Technically difficult to visualize, but appears to measure in the 3-4 mm range. Liver: Increased echogenicity with poor acoustic through transmission, features suggesting fatty deposition.No focal lesion identified. Within normal limits in parenchymal echogenicity. IMPRESSION: Fatty liver.  Otherwise unremarkable. Electronically Signed   By: Jamison Oka.D.  On: 01/22/2017 11:10    Time Spent in minutes  25   Eddie NorthHUNGEL, Elouise Divelbiss M.D on 01/25/2017 at 12:17 PM  Between 7am to 7pm - Pager - [phone]  After 7pm go to [email or web address] - password [email or web address]  [facility] -  Office  [phone]

## 2017-01-25 NOTE — Progress Notes (Signed)
[facility] PROGRESS NOTE Subjective patient eating low-fat diet feeling much better no pain triglycerides 448  Objective: Vital signs in last 24 hours: Temp:  [97.8 F (36.6 C)-99.4 F (37.4 C)] 97.8 F (36.6 C) (03/08 1802) Pulse Rate:  [63-69] 69 (03/08 1802) Resp:  [16-18] 18 (03/08 1802) BP: (132-141)/(76-91) 141/91 (03/08 1802) SpO2:  [96 %-100 %] 96 % (03/08 1802) Weight:  [88.9 kg (195 lb 14.4 oz)] 88.9 kg (195 lb 14.4 oz) (03/07 2235) Last BM Date: 01/24/17  Intake/Output from previous day: 03/07 0701 - 03/08 0700 In: 3598.8 [P.O.:1920; I.V.:1678.8] Out: 400 [Urine:400] Intake/Output this shift: Total I/O In: 720 [P.O.:720] Out: 0   PE:  Abdomen-- minimal tenderness  Lab Results:  Recent Labs  01/23/17 0529 01/23/17 1213  WBC 13.7*  --   HGB 14.8 13.9  HCT 42.8 41.0  PLT 218  --    BMET  Recent Labs  01/23/17 0529 01/23/17 1213 01/24/17 0738 01/25/17 0703  NA 134* 137 136 136  K 4.3 3.4* 3.5 3.9  CL 101 101 103 106  CO2 20*  --  24 23  CREATININE 0.81 0.80 0.87 0.80   LFT  Recent Labs  01/23/17 0529  PROT 7.3  AST 47*  ALT 34  ALKPHOS 40  BILITOT 2.1*   PT/INR No results for input(s): LABPROT, INR in the last 72 hours. PANCREAS No results for input(s): LIPASE in the last 72 hours.       Studies/Results: Ct Abdomen Pelvis W Contrast  Result Date: 01/24/2017 CLINICAL DATA:  Epigastric pain with nausea, vomiting and diarrhea for 3 days. History of pancreatitis. EXAM: CT ABDOMEN AND PELVIS WITH CONTRAST TECHNIQUE: Multidetector CT imaging of the abdomen and pelvis was performed using the standard protocol following bolus administration of intravenous contrast. CONTRAST:  100 ISOVUE-300 IOPAMIDOL (ISOVUE-300) INJECTION 61% COMPARISON:  07/16/2016 and 03/02/2015. FINDINGS: Lower chest: Respiratory motion degrades image quality. Dependent atelectasis in the right lower lobe. Collapse/consolidation in the lingula and left lower  lobe adjacent to an elevated left hemidiaphragm. Heart size normal. No pericardial or pleural effusion. Hepatobiliary: Liver is decreased in attenuation diffusely. Liver and gallbladder are otherwise unremarkable. No biliary ductal dilatation. Pancreas: The pancreatic head has a somewhat bullous appearance with blunting of the uncinate process, similar to the prior exam. There is haziness and a small amount of fluid in the surrounding fat with probable slight thickening of the adjacent duodenum. There may be mild ductal dilatation in the head of the pancreas is well. Remainder the pancreas is unremarkable. Spleen: Negative. Adrenals/Urinary Tract: Adrenal glands and kidneys are unremarkable. Ureters are decompressed. Bladder is grossly unremarkable. Stomach/Bowel: Tiny diverticulum off the posterior stomach. Stomach is decompressed. Mild thickening and inflammatory changes involving the horizontal portion of the duodenum. Small bowel, appendix and colon are otherwise unremarkable. Vascular/Lymphatic: Atherosclerotic calcification of the arterial vasculature without abdominal aortic aneurysm. No pathologically enlarged lymph nodes. Reproductive: Prostate is visualized. Other: Small bilateral inguinal hernias contain fat. A knuckle of small bowel is seen within the right inguinal hernia. No free fluid. Mesenteries and peritoneum are otherwise unremarkable. Musculoskeletal: No worrisome lytic or sclerotic lesions. A probable 2.0 cm sebaceous cyst overlies the lower thoracic spine. IMPRESSION: 1. Acute pancreatitis with secondary inflammation of the duodenum. 2. Hepatic steatosis. 3. Collapse/consolidation in the lingula and left lower lobe. Difficult to exclude pneumonia. 4.  Aortic atherosclerosis (ICD10-170.0). Electronically Signed   By: Leanna Battles M.D.   On: 01/24/2017 07:34    Medications: I have reviewed  the patient's current medications.  Assessment/Plan: 1. Acute pancreatitis secondary to  hypertriglyceridemia. Would go ahead and send him home on fenofibrate. I have discussed with him and his family the fact that he needs to take this and definitely on an ongoing basis and needs to establish care with the primary care doctor. Please call us back for any further problems.   Shalawn Wynder JR,Floyce Bujak L 01/25/2017, 6:53 PM  This note was created using voice recognition software. Minor errors may Have occurred unintentionally.  Pager: [phone] If no answer or after hours call [phone]

## 2017-01-26 DIAGNOSIS — I10 Essential (primary) hypertension: Secondary | ICD-10-CM | POA: Diagnosis present

## 2017-01-26 LAB — TRIGLYCERIDES: TRIGLYCERIDES: 602 mg/dL — AB (ref <150)

## 2017-01-26 MED ORDER — FENOFIBRATE 160 MG PO TABS: 160.0000 mg | ORAL_TABLET | Freq: Every day | ORAL | 2 refills | Status: DC

## 2017-01-26 MED ORDER — NICOTINE 21 MG/24HR TD PT24: 21.0000 mg | MEDICATED_PATCH | Freq: Every day | TRANSDERMAL | 0 refills | Status: DC

## 2017-01-26 MED ORDER — AMLODIPINE BESYLATE 10 MG PO TABS: 10.0000 mg | ORAL_TABLET | Freq: Every day | ORAL | 0 refills | Status: DC

## 2017-01-26 NOTE — Discharge Summary (Signed)
Physician Discharge Summary  Kela MillinJutono Duplechain ZOX:[id number] DOB: 10/11/1966 DOA: 01/21/2017  PCP: No PCP Per Patient  Admit date: 01/21/2017 Discharge date: 01/26/2017  Admitted From: home Disposition:  home  Recommendations for Outpatient Follow-up:  1. Follow up with Dr Sharl Makerr in 2 weeks. Office will arrange outpt appointment.  Home Health: none Equipment/Devices: none  Discharge Condition: fair CODE STATUS:Full code Diet recommendation: Low sodium, low-cholesterol    Discharge Diagnoses:  Principal Problem:   Acute pancreatitis  Active Problems:   Tobacco use   Hypertriglyceridemia   Hyperkalemia  Hypokalemia   Essential hypertension, benign  Brief narrative/history of present illness 51 year old male with history of pancreatitis secondary to hypertriglyceridemia and hypertension presented with 3-4 days of abdominal pain associated with nausea and vomiting. He was hospitalized in September 2017 for similar symptoms and was discharged on fenofibrate but did not feel them due to affordability. (He did not have insurance at that time). He was taking 1 fish oil daily. Blood work done showed lipase of 347, WBC of 12.2 K and triglycerides of 4891. [facility] and [facility] plasmapheres[facility].   Principal Problem:   Acute pancreatitis -Secondary to severe hypertriglyceridemia. Central line placed and Patient received plasmapheresis x1. Triglycerides have improved to  <500 frequently -CT abdomen and pelvis on admission showing acute pancreatitis and mild duodenitis. -Appreciate [facility] evaluation. Tolerating advanced diet. No further plasmapheresis needed. -Patient will be discharged on fenofibrate (seems to be affordable with co-pay.) . lavaza  would be expensive for him even with co-pay.  instructed clearly that he should be on fibrates for  life.  -Patient will need to follow-up as outpatient closely.  Discussed with endocrinologist Dr Sharl MaKerr who  agrees on following patient as outpatient and will arrange follow-up.Marland Kitchen.  Active Problems:   Tobacco use Counseled on cessation. Nicotine patch scribed.  Essential hypertension Not on any medications. I have started him on amlodipine daily.   Hyperkalemia/Hypokalemia Corrected/ Replenished    Family Communication  : None at bedside  Disposition Plan  : Home   Consults  :   [facility]  Procedures  :  CT abdomen and pelvis  plasmapheresis  Discharge Instructions   Allergies as of 01/26/2017      Reactions   Fish Allergy Nausea And Vomiting, Other (See Comments)   Can eat shrimp & scallops...sometimes crab      Medication List    STOP taking these medications   ibuprofen 200 MG tablet Commonly known as:  ADVIL,MOTRIN     TAKE these medications   ACID REDUCER PO Take 1 capsule by mouth daily.   amLODipine 10 MG tablet Commonly known as:  NORVASC Take 1 tablet (10 mg total) by mouth daily. Start taking on:  01/27/2017   fenofibrate 160 MG tablet Take 1 tablet (160 mg total) by mouth daily. Start taking on:  01/27/2017   FISH OIL PO Take 1 capsule by mouth daily.   nicotine 21 mg/24hr patch Commonly known as:  NICODERM CQ - dosed in mg/24 hours Place 1 patch (21 mg total) onto the skin daily. Start taking on:  01/27/2017      Follow-up Information    KERR,JEFFREY, MD Follow up in 2 week(s).   Specialty:  Endocrinology Contact information: [address] [place] [address] [phone]          Allergies  Allergen Reactions  . Fish Allergy Nausea And Vomiting and Other (See Comments)  Can eat shrimp & scallops...sometimes crab      Procedures/Studies: Ct Abdomen Pelvis W Contrast  Result Date: 01/24/2017 CLINICAL DATA:  Epigastric pain with nausea, vomiting and diarrhea for 3 days. History of pancreatitis. EXAM: CT ABDOMEN AND PELVIS WITH CONTRAST TECHNIQUE: Multidetector CT imaging of the abdomen and  pelvis was performed using the standard protocol following bolus administration of intravenous contrast. CONTRAST:  100 ISOVUE-300 IOPAMIDOL (ISOVUE-300) INJECTION 61% COMPARISON:  07/16/2016 and 03/02/2015. FINDINGS: Lower chest: Respiratory motion degrades image quality. Dependent atelectasis in the right lower lobe. Collapse/consolidation in the lingula and left lower lobe adjacent to an elevated left hemidiaphragm. Heart size normal. No pericardial or pleural effusion. Hepatobiliary: Liver is decreased in attenuation diffusely. Liver and gallbladder are otherwise unremarkable. No biliary ductal dilatation. Pancreas: The pancreatic head has a somewhat bullous appearance with blunting of the uncinate process, similar to the prior exam. There is haziness and a small amount of fluid in the surrounding fat with probable slight thickening of the adjacent duodenum. There may be mild ductal dilatation in the head of the pancreas is well. Remainder the pancreas is unremarkable. Spleen: Negative. Adrenals/Urinary Tract: Adrenal glands and kidneys are unremarkable. Ureters are decompressed. Bladder is grossly unremarkable. Stomach/Bowel: Tiny diverticulum off the posterior stomach. Stomach is decompressed. Mild thickening and inflammatory changes involving the horizontal portion of the duodenum. Small bowel, appendix and colon are otherwise unremarkable. Vascular/Lymphatic: Atherosclerotic calcification of the arterial vasculature without abdominal aortic aneurysm. No pathologically enlarged lymph nodes. Reproductive: Prostate is visualized. Other: Small bilateral inguinal hernias contain fat. A knuckle of small bowel is seen within the right inguinal hernia. No free fluid. Mesenteries and peritoneum are otherwise unremarkable. Musculoskeletal: No worrisome lytic or sclerotic lesions. A probable 2.0 cm sebaceous cyst overlies the lower thoracic spine. IMPRESSION: 1. Acute pancreatitis with secondary inflammation of the  duodenum. 2. Hepatic steatosis. 3. Collapse/consolidation in the lingula and left lower lobe. Difficult to exclude pneumonia. 4.  Aortic atherosclerosis (ICD10-170.0). Electronically Signed   By: Leanna Battles M.D.   On: 01/24/2017 07:34   Ir Fluoro Guide Cv Line Right  Result Date: 01/23/2017 INDICATION: 51 year old with hypertriglyceridemia. Patient needs a catheter for pheresis. EXAM: FLUOROSCOPIC AND ULTRASOUND GUIDED PLACEMENT OF A NON-TUNNELED PHERESIS CATHETER Physician: Rachelle Hora. Henn, MD MEDICATIONS: None ANESTHESIA/SEDATION: None FLUOROSCOPY TIME:  Fluoroscopy Time: 24 seconds, 1.4 mGy COMPLICATIONS: None immediate. PROCEDURE: The procedure was explained to the patient. The risks and benefits of the procedure were discussed and the patient's questions were addressed. Informed consent was obtained from the patient. The patient was placed supine on the interventional table. Ultrasound confirmed a patent right internal jugular vein. Ultrasound images were obtained for documentation. The right side of the neck was prepped and draped in a sterile fashion. The right side of the neck was anesthetized with 1% lidocaine. Maximal barrier sterile technique was utilized including caps, mask, sterile gowns, sterile gloves, sterile drape, hand hygiene and skin antiseptic. A small incision was made with #11 blade scalpel. A 21 gauge needle directed into the right internal jugular vein with ultrasound guidance. A micropuncture dilator set was placed. A 20 cm Mahurkar catheter was selected. The catheter was advanced over a wire and positioned at the superior cavoatrial junction. Fluoroscopic images were obtained for documentation. Both pheresis lumens were found to aspirate and flush well. The proper amount of heparin was flushed in both lumens. The central venous lumen was flushed with normal saline. Catheter was sutured to skin. FINDINGS: Catheter tip at the superior  cavoatrial junction. IMPRESSION: Successful  placement of a right jugular non-tunneled pheresis catheter using ultrasound and fluoroscopic guidance. Electronically Signed   By: [name] M.D.   On: 01/23/2017 11:38   Ir US Guide Vasc Access Right  Result Date: 01/23/2017 INDICATION: 51 year old with hypertriglyceridemia. Patient needs a catheter for pheresis. EXAM: FLUOROSCOPIC AND ULTRASOUND GUIDED PLACEMENT OF A NON-TUNNELED PHERESIS CATHETER Physician: Rachelle Hora. Henn, MD MEDICATIONS: None ANESTHESIA/SEDATION: None FLUOROSCOPY TIME:  Fluoroscopy Time: 24 seconds, 1.4 mGy COMPLICATIONS: None immediate. PROCEDURE: The procedure was explained to the patient. The risks and benefits of the procedure were discussed and the patient's questions were addressed. Informed consent was obtained from the patient. The patient was placed supine on the interventional table. Ultrasound confirmed a patent right internal jugular vein. Ultrasound images were obtained for documentation. The right side of the neck was prepped and draped in a sterile fashion. The right side of the neck was anesthetized with 1% lidocaine. Maximal barrier sterile technique was utilized including caps, mask, sterile gowns, sterile gloves, sterile drape, hand hygiene and skin antiseptic. A small incision was made with #11 blade scalpel. A 21 gauge needle directed into the right internal jugular vein with ultrasound guidance. A micropuncture dilator set was placed. A 20 cm Mahurkar catheter was selected. The catheter was advanced over a wire and positioned at the superior cavoatrial junction. Fluoroscopic images were obtained for documentation. Both pheresis lumens were found to aspirate and flush well. The proper amount of heparin was flushed in both lumens. The central venous lumen was flushed with normal saline. Catheter was sutured to skin. FINDINGS: Catheter tip at the superior cavoatrial junction. IMPRESSION: Successful placement of a right jugular non-tunneled pheresis catheter using ultrasound  and fluoroscopic guidance. Electronically Signed   By: [name] M.D.   On: 01/23/2017 11:38   US Abdomen Limited Ruq  Result Date: 01/22/2017 CLINICAL DATA:  Acute pancreatitis. EXAM: US ABDOMEN LIMITED - RIGHT UPPER QUADRANT COMPARISON:  CT scan 07/16/2016. FINDINGS: Gallbladder: No gallstones or wall thickening visualized. No sonographic Murphy sign noted by sonographer. Common bile duct: Technically difficult to visualize, but appears to measure in the 3-4 mm range. Liver: Increased echogenicity with poor acoustic through transmission, features suggesting fatty deposition.No focal lesion identified. Within normal limits in parenchymal echogenicity. IMPRESSION: Fatty liver.  Otherwise unremarkable. Electronically Signed   By: Kennith Center M.D.   On: 01/22/2017 11:10       Subjective:  Feels better. No further abdominal pain and tolerating advanced diet. Discharge Exam: Vitals:   01/25/17 2058 01/26/17 0415  BP: 123/72 118/75  Pulse: 68 61  Resp: 18 18  Temp: 98.9 F (37.2 C) 98.1 F (36.7 C)   Vitals:   01/25/17 1000 01/25/17 1802 01/25/17 2058 01/26/17 0415  BP: 137/77 (!) 141/91 123/72 118/75  Pulse: 67 69 68 61  Resp: 18 18 18 18   Temp: 98.2 F (36.8 C) 97.8 F (36.6 C) 98.9 F (37.2 C) 98.1 F (36.7 C)  TempSrc: Oral Oral Oral Oral  SpO2: 99% 96% 96% 95%  Weight:   83.7 kg (184 lb 9.6 oz)   Height:         Gen: not in distress HEENT: moist mucosa, supple neck Chest: clear b/l, no added sounds CVS: N S1&S2, no murmurs GI: soft, mild epigastric tenderness, ND Musculoskeletal: warm, no edema   The results of significant diagnostics from this hospitalization (including imaging, microbiology, ancillary and laboratory) are listed below for reference.     Microbiology:  No results found for this or any previous visit (from the past 240 hour(s)).   Labs: BNP (last 3 results) No results for input(s): BNP in the last 8760 hours. Basic Metabolic Panel:  Recent  Labs Lab 01/21/17 1916  01/22/17 0951 01/23/17 0529 01/23/17 1213 01/24/17 0738 01/25/17 0703  NA 134*  --  129* 134* 137 136 136  K 6.8*  < > 5.8* 4.3 3.4* 3.5 3.9  CL 103  --  99* 101 101 103 106  CO2 17*  --  21* 20*  --  24 23  GLUCOSE 120*  --  120* 108* 101* 98 109*  BUN 14  --  8 8 8 9 8   CREATININE 0.96  --  1.30* 0.81 0.80 0.87 0.80  CALCIUM 9.6  --  8.8* 9.1  --  8.8* 8.7*  < > = values in this interval not displayed. Liver Function Tests:  Recent Labs Lab 01/21/17 1916 01/22/17 0951 01/23/17 0529  AST RESULTS UNAVAILABLE DUE TO INTERFERING SUBSTANCE 66* 47*  ALT RESULTS UNAVAILABLE DUE TO INTERFERING SUBSTANCE 48 34  ALKPHOS 38 37* 40  BILITOT 4.2* 3.1* 2.1*  PROT RESULTS UNAVAILABLE DUE TO INTERFERING SUBSTANCE 6.8 7.3  ALBUMIN 4.2 4.7 4.0    Recent Labs Lab 01/21/17 1916  LIPASE 347*   No results for input(s): AMMONIA in the last 168 hours. CBC:  Recent Labs Lab 01/21/17 1916 01/22/17 0618 01/23/17 0529 01/23/17 1213  WBC 7.9 12.2* 13.7*  --   HGB 12.9* 15.7 14.8 13.9  HCT 38.3* 43.2 42.8 41.0  MCV 81.7 79.4 79.0  --   PLT 347 356 218  --    Cardiac Enzymes: No results for input(s): CKTOTAL, CKMB, CKMBINDEX, TROPONINI in the last 168 hours. BNP: Invalid input(s): POCBNP CBG: No results for input(s): GLUCAP in the last 168 hours. D-Dimer No results for input(s): DDIMER in the last 72 hours. Hgb A1c No results for input(s): HGBA1C in the last 72 hours. Lipid Profile  Recent Labs  01/24/17 0738 01/25/17 0703 01/26/17 0623  CHOL 119  --   --   HDL <10*  --   --   LDLCALC NOT CALCULATED  --   --   TRIG 386* 448* 602*  CHOLHDL NOT CALCULATED  --   --    Thyroid function studies No results for input(s): TSH, T4TOTAL, T3FREE, THYROIDAB in the last 72 hours.  Invalid input(s): FREET3 Anemia work up No results for input(s): VITAMINB12, FOLATE, FERRITIN, TIBC, IRON, RETICCTPCT in the last 72 hours. Urinalysis    Component Value  Date/Time   COLORURINE YELLOW 01/21/2017 1916   APPEARANCEUR CLEAR 01/21/2017 1916   LABSPEC 1.021 01/21/2017 1916   PHURINE 5.0 01/21/2017 1916   GLUCOSEU NEGATIVE 01/21/2017 1916   HGBUR MODERATE (A) 01/21/2017 1916   BILIRUBINUR NEGATIVE 01/21/2017 1916   KETONESUR NEGATIVE 01/21/2017 1916   PROTEINUR 30 (A) 01/21/2017 1916   UROBILINOGEN 1.0 03/02/2015 1224   NITRITE NEGATIVE 01/21/2017 1916   LEUKOCYTESUR NEGATIVE 01/21/2017 1916   Sepsis Labs Invalid input(s): PROCALCITONIN,  WBC,  LACTICIDVEN Microbiology No results found for this or any previous visit (from the past 240 hour(s)).   Time coordinating discharge: Over 30 minutes  SIGNED:   Eddie North, MD  [facility] 01/26/2017, 10:44 AM Pager   If 7PM-7AM, please contact night-coverage [email or web address] Password [email or web address]

## 2017-01-26 NOTE — Discharge Instructions (Addendum)
Acute Pancreatitis Acute pancreatitis is a condition in which the pancreas suddenly becomes irritated and swollen (has inflammation). The pancreas is a gland that is located behind the stomach. It produces enzymes that help to digest food. The pancreas also releases the hormones glucagon and insulin, which help to regulate blood sugar. Damage to the pancreas occurs when the digestive enzymes from the pancreas are activated before they are released into the intestine. Most acute attacks last a couple of days and can cause serious problems. Some people become dehydrated and develop low blood pressure. In severe cases, bleeding into the pancreas can lead to shock and can be life-threatening. The lungs, heart, and kidneys may fail. What are the causes? The most common causes of this condition are:  Alcohol abuse.  Gallstones. Other causes include:  Certain medicines.  Exposure to certain chemicals.  Infection.  Damage caused by an accident (trauma).  Abdominal surgery. In some cases, the cause may not be known. What are the signs or symptoms? Symptoms of this condition include:  Pain in the upper abdomen that may radiate to the back.  Tenderness and swelling of the abdomen.  Nausea and vomiting. How is this diagnosed? This condition may be diagnosed based on:  A physical exam.  Blood tests.  Imaging tests, such as X-rays, CT scans, or an ultrasound of the abdomen. How is this treated? Treatment for this condition usually requires a stay in the hospital. Treatment may include:  Pain medicine.  Fluid replacement through an IV tube.  Placing a tube in the stomach to remove stomach contents and to control vomiting (NG tube, or nasogastric tube).  Not eating for 3-4 days. This gives the pancreas a rest, because enzymes are not being produced that can cause further damage.  Antibiotic medicines, if your condition is caused by an infection.  Surgery on the pancreas or  gallbladder. Follow these instructions at home: Eating and drinking   Follow instructions from your health care provider about diet. This may involve avoiding alcohol and decreasing the amount of fat in your diet.  Eat smaller, more frequent meals. This reduces the amount of digestive fluids that the pancreas produces.  Drink enough fluid to keep your urine clear or pale yellow.  Do not drink alcohol if it caused your condition. General instructions   Take over-the-counter and prescription medicines only as told by your health care provider.  Do not use any tobacco products, such as cigarettes, chewing tobacco, and e-cigarettes. If you need help quitting, ask your health care provider.  Get plenty of rest.  If directed, check your blood sugar at home as told by your health care provider.  Keep all follow-up visits as told by your health care provider. This is important. Contact a health care provider if:  You do not recover as quickly as expected.  You develop new or worsening symptoms.  You have persistent pain, weakness, or nausea.  You recover and then have another episode of pain.  You have a fever. Get help right away if:  You cannot eat or keep fluids down.  Your pain becomes severe.  Your skin or the white part of your eyes turns yellow (jaundice).  You vomit.  You feel dizzy or you faint.  Your blood sugar is high (over 300 mg/dL). This information is not intended to replace advice given to you by your health care provider. Make sure you discuss any questions you have with your health care provider. Document Released: 11/06/2005 Document Revised:  03/15/2016 Document Reviewed: 08/10/2015 Elsevier Interactive Patient Education  2017 [employer].   Food Choices to Lower Your Triglycerides Triglycerides are a type of fat in your blood. High levels of triglycerides can increase the risk of heart disease and stroke. If your triglyceride levels are high, the  foods you eat and your eating habits are very important. Choosing the right foods can help lower your triglycerides. What general guidelines do I need to follow?  Lose weight if you are overweight.  Limit or avoid alcohol.  Fill one half of your plate with vegetables and green salads.  Limit fruit to two servings a day. Choose fruit instead of juice.  Make one fourth of your plate whole grains. Look for the word "whole" as the first word in the ingredient list.  Fill one fourth of your plate with lean protein foods.  Enjoy fatty fish (such as salmon, mackerel, sardines, and tuna) three times a week.  Choose healthy fats.  Limit foods high in starch and sugar.  Eat more home-cooked food and less restaurant, buffet, and fast food.  Limit fried foods.  Cook foods using methods other than frying.  Limit saturated fats.  Check ingredient lists to avoid foods with partially hydrogenated oils (trans fats) in them. What foods can I eat? Grains  Whole grains, such as whole wheat or whole grain breads, crackers, cereals, and pasta. Unsweetened oatmeal, bulgur, barley, quinoa, or brown rice. Corn or whole wheat flour tortillas. Vegetables  Fresh or frozen vegetables (raw, steamed, roasted, or grilled). Green salads. Fruits  All fresh, canned (in natural juice), or frozen fruits. Meat and Other Protein Products  Ground beef (85% or leaner), grass-fed beef, or beef trimmed of fat. Skinless chicken or [place]. Ground chicken or [place]. Pork trimmed of fat. All fish and seafood. Eggs. Dried beans, peas, or lentils. Unsalted nuts or seeds. Unsalted canned or dry beans. Dairy  Low-fat dairy products, such as skim or 1% milk, 2% or reduced-fat cheeses, low-fat ricotta or cottage cheese, or plain low-fat yogurt. Fats and Oils  Tub margarines without trans fats. Light or reduced-fat mayonnaise and salad dressings. Avocado. Safflower, olive, or canola oils. Natural peanut or almond butter. The  items listed above may not be a complete list of recommended foods or beverages. Contact your dietitian for more options.  What foods are not recommended? Grains  White bread. White pasta. White rice. Cornbread. Bagels, pastries, and croissants. Crackers that contain trans fat. Vegetables  White potatoes. Corn. Creamed or fried vegetables. Vegetables in a cheese sauce. Fruits  Dried fruits. Canned fruit in light or heavy syrup. Fruit juice. Meat and Other Protein Products  Fatty cuts of meat. Ribs, chicken wings, bacon, sausage, bologna, salami, chitterlings, fatback, hot dogs, bratwurst, and packaged luncheon meats. Dairy  Whole or 2% milk, cream, half-and-half, and cream cheese. Whole-fat or sweetened yogurt. Full-fat cheeses. Nondairy creamers and whipped toppings. Processed cheese, cheese spreads, or cheese curds. Sweets and Desserts  Corn syrup, sugars, honey, and molasses. Candy. Jam and jelly. Syrup. Sweetened cereals. Cookies, pies, cakes, donuts, muffins, and ice cream. Fats and Oils  Butter, stick margarine, lard, shortening, ghee, or bacon fat. Coconut, palm kernel, or palm oils. Beverages  Alcohol. Sweetened drinks (such as sodas, lemonade, and fruit drinks or punches). The items listed above may not be a complete list of foods and beverages to avoid. Contact your dietitian for more information.  This information is not intended to replace advice given to you by your health care  provider. Make sure you discuss any questions you have with your health care provider. Document Released: 08/24/2004 Document Revised: 04/13/2016 Document Reviewed: 09/10/2013 Elsevier Interactive Patient Education  2017 [employer]

## 2017-01-26 NOTE — Progress Notes (Signed)
Pt discharge to home, discharge instructions, medications and follow up appointments discussed and reviewed with pt, verbalized understanding. IV discontinued, catheter intact, site clean and dry. Pt was escorted out of the unit in wheelchair, took all belongings with him.

## 2017-01-26 NOTE — Care Management Note (Signed)
Case Management Note  Patient Details  Name: Jordan [name] MRN: [id number] Date of Birth: 18-Jan-1966  Subjective/Objective:     CM following for progression and d/c planning.                Action/Plan: 01/26/2017 Per MD request cost of pt medication for elevated trigly requested from pt insurance provider and given to Dr Ronna PolioGhungel.  Per pt insurance provider [employer]: Fenfobibrate copay $16.39 Lovaza copay: $285.59  Expected Discharge Date:    01/26/2017              Expected Discharge Plan:  Home/Self Care  In-House Referral:  NA  Discharge planning Services  CM Consult, Medication Assistance  Post Acute Care Choice:  NA Choice offered to:  NA  DME Arranged:  N/A DME Agency:  NA  HH Arranged:  NA HH Agency:  NA  Status of Service:  Completed, signed off  If discussed at Long Length of Stay Meetings, dates discussed:    Additional Comments:  Jordan [name], Jordan Loiseau U, RN 01/26/2017, 11:11 AM

## 2017-03-05 ENCOUNTER — Encounter: Payer: BLUE CROSS/BLUE SHIELD | Attending: Internal Medicine | Admitting: Dietician

## 2017-03-05 ENCOUNTER — Encounter: Payer: Self-pay | Admitting: Dietician

## 2017-03-05 DIAGNOSIS — E781 Pure hyperglyceridemia: Secondary | ICD-10-CM | POA: Insufficient documentation

## 2017-03-05 DIAGNOSIS — K859 Acute pancreatitis without necrosis or infection, unspecified: Secondary | ICD-10-CM | POA: Diagnosis not present

## 2017-03-05 DIAGNOSIS — Z713 Dietary counseling and surveillance: Secondary | ICD-10-CM | POA: Diagnosis present

## 2017-03-05 NOTE — Patient Instructions (Addendum)
Consider reading labels.  If it says hydrogenated or partially hydrogenated then avoid. Consider alternatives for the AMP and gingerale.  Water is great! Avoid other sweetened beverages such as juice and sweet tea. Find ways to increase the vegetables in your diet.   Consider making your rolls with 1/2 whole wheat flour. Consider finding a recipe that calls for a small amount of oil (canola or olive oil) rather than lard or shortening. Lean meat Bake, boil, grill rather than fry. Try to limit fat to 50 grams per day. Consider My Fitness Pal app on your phone. McDonald's:  Egg White delite McMuffin rather than the bacon, egg, and cheese. Subway:  [place] sub without mayo Aim for baked or broiled choices rather than fried when eating out. Be more aware of what is in the foods when eating out? Control what you can.

## 2017-03-05 NOTE — Progress Notes (Signed)
Medical Nutrition Therapy:  Appt start time: 0945 end time:  1050.   Assessment:  Primary concerns today: Patient is here today alone.  He would like to learn what to avoid eating to help avoid pancreatitis and help with increased triglycerides.  Hx includes pancreatitis and smoking.  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  He reports that he has been having leg pain and pin like pain in his foot.  He states that he will be going to a pediatrist for this.  This limits his exercise other than what is required for his job.  When on duty, he has to stay within a certain vicinity which does not allow many meal options.  His triglycerides are very elevated and noted to be >600 01/26/17.  Patient lives with a male friend.  Mostly eats out.  His friend cooks only 1-2 days per week.  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  He mostly works first shift but covers 2nd and 3rd shifts as needed.  Patient states that his friend makes rolls that he likes a lot (made with 2 sticks lard/shortening and white flour).  He and another friend have made them.  Preferred Learning Style:   No preference indicated   Learning Readiness:   Contemplating   MEDICATIONS: see list   DIETARY INTAKE:  Usual eating pattern includes 2 meals and 0-1 snacks per day.  Everyday foods include fast foods.  Avoided foods include milk.  Patient states that he is lactose intolerant.  24-hr recall:  B ( AM): AMP Energy drink AND [employer] fried chicken sandwich on bun with fries OR McDonald's Bacon, Egg  and [employer] with a Hashbrown OR skips Snk ( AM): none  L ( PM):  Frozen dinner OR [employer] fired chicken tenders and french fries OR corner store (3 fried chicken tenders, deep fried potato wedges Snk ( PM): occasional slice of cake D ( PM): [place] chop (fried in olive oil), mashed  potatoes, spinach OR Subway or [employer] [place] sub with very lettuce and very little mayo Snk ( PM): none Beverages: 1-2 AMP Energy drink daily (drinks 2 only when going to second job), water with sugar free lemonade packet, Gingerale (1 bottle),  Alcohol (last drink 4-5 months ago).  Usual physical activity: walks some on the job, otherwise none  Estimated energy needs: 1600 calories 200 g carbohydrates 135 g protein 44 g fat  Progress Towards Goal(s):  In progress.   Nutritional Diagnosis:  NB-1.1 Food and nutrition-related knowledge deficit As related to diet to decrease triglycerides to decrease risk for pancreatitis.  .  As evidenced by diet hx and patient report.    Intervention:  Nutrition counseling/education on nutrition to reduce triglycerides and pancreatitis risk.  Patient states that he rarely drinks alcohol.  Recommended that he continue to avoid.  Discussed need to reduce fat intake and menu items to choose to help with this as well as importance of decreasing his refined sugar intake and increasing vegetable intake.  Discussed simple meals that could be prepared at home.  Patient knows how to cook but does not.    Consider reading labels.  If it says hydrogenated or partially hydrogenated then avoid. Consider alternatives for the AMP and gingerale.  Water is great! Avoid other sweetened beverages such as juice and sweet tea. Find ways to  increase the vegetables in your diet.   Consider making your rolls with 1/2 whole wheat flour. Consider finding a recipe that calls for a small amount of oil (canola or olive oil) rather than lard or shortening. Lean meat Bake, boil, grill rather than fry. Try to limit fat to 50 grams per day. Consider My Fitness Pal app on your phone. McDonald's:  Egg White delite McMuffin rather than the bacon, egg, and cheese. Subway:  [place] sub without mayo Aim for baked or broiled choices rather than fried when eating out. Be more aware of  what is in the foods when eating out? Control what you can.   Teaching Method Utilized:  Visual Auditory Hands on  Handouts given during visit include:  My plate  High Triglyceride levels meal planning tips (mailed)  High Triglyceride Nutrition Therapy (mailed)  Whole wheat, low fat roll recipe  Barriers to learning/adherence to lifestyle change: eats out most often.  Desire to change  Demonstrated degree of understanding via:  Teach Back   Monitoring/Evaluation:  Dietary intake, exercise, label reading, and body weight prn.

## 2017-03-20 ENCOUNTER — Ambulatory Visit: Payer: BLUE CROSS/BLUE SHIELD

## 2017-03-20 ENCOUNTER — Encounter (INDEPENDENT_AMBULATORY_CARE_PROVIDER_SITE_OTHER): Payer: BLUE CROSS/BLUE SHIELD | Admitting: Podiatry

## 2017-03-20 DIAGNOSIS — M79672 Pain in left foot: Secondary | ICD-10-CM

## 2017-03-20 NOTE — Progress Notes (Signed)
This encounter was created in error - please disregard.

## 2017-04-02 ENCOUNTER — Ambulatory Visit: Payer: BLUE CROSS/BLUE SHIELD | Admitting: Dietician

## 2017-05-01 ENCOUNTER — Ambulatory Visit: Payer: BLUE CROSS/BLUE SHIELD

## 2017-05-01 ENCOUNTER — Encounter (INDEPENDENT_AMBULATORY_CARE_PROVIDER_SITE_OTHER): Payer: BLUE CROSS/BLUE SHIELD | Admitting: Podiatry

## 2017-05-01 DIAGNOSIS — M779 Enthesopathy, unspecified: Secondary | ICD-10-CM

## 2017-05-01 NOTE — Progress Notes (Signed)
This encounter was created in error - please disregard.

## 2017-05-02 ENCOUNTER — Ambulatory Visit (INDEPENDENT_AMBULATORY_CARE_PROVIDER_SITE_OTHER): Payer: BLUE CROSS/BLUE SHIELD | Admitting: Podiatry

## 2017-05-02 ENCOUNTER — Encounter: Payer: Self-pay | Admitting: Podiatry

## 2017-05-02 ENCOUNTER — Ambulatory Visit: Payer: BLUE CROSS/BLUE SHIELD

## 2017-05-02 VITALS — BP 122/70 | HR 69

## 2017-05-02 DIAGNOSIS — R0989 Other specified symptoms and signs involving the circulatory and respiratory systems: Secondary | ICD-10-CM | POA: Diagnosis not present

## 2017-05-02 DIAGNOSIS — S93402A Sprain of unspecified ligament of left ankle, initial encounter: Secondary | ICD-10-CM | POA: Diagnosis not present

## 2017-05-02 DIAGNOSIS — M79662 Pain in left lower leg: Secondary | ICD-10-CM | POA: Diagnosis not present

## 2017-05-02 NOTE — Patient Instructions (Signed)
Today I had difficulty feeling the pulses inferior right and left feet and will order a lower extremity arterial Doppler to check the vascular supply in your legs. Also, there is calf tenderness in low-grade swelling will order a venous Doppler left rule out a blood clot. Will notify you the results of the lab. In the event that the venous Doppler in your left leg is positive for a clot you'll immediately need medical attention from your primary care physician or emergency department

## 2017-05-02 NOTE — Progress Notes (Signed)
   Subjective:    Patient ID: Jordan [name], male    DOB: 10-07-1966, 51 y.o.   MRN: [id number]  HPI This patient presents today with approximately one-month history of pain that started in plantar aspect of left foot, resulting in a painful gait resulting in complaint of pain in the medial left ankle and now the medial posterior left leg. The symptoms are rather persistent on and off weightbearing, however, are aggravated with standing and walking and relieved somewhat with rest and elevation. The symptoms have been persistent and have become more painful over time.  Patient is a current smoker smoking 1 pack of cigarettes daily for the past 30 years   Review of Systems  Musculoskeletal: Positive for gait problem and joint swelling.       Objective:   Physical Exam  Orientated 3  Vascular: DP pulses 0/4 bilaterally PT pulses 0/4 bilaterally Capillary reflex immediate bilaterally Palpable tenderness posterior left calf with low-grade edema  Neurological: Sensation to 10 g monofilament wire intact 5/5 bilaterally Vibratory sensation reactive bilaterally Ankle reflex reactive bilaterally  Dermatological: No open skin lesions bilaterally Dry skin bilaterally  Musculoskeletal: Pes planus bilaterally Painful gait Palpable tenderness in the fascial thickening plantar left arch Palpable tenderness medial left ankle and posterior left calf       Assessment & Plan:   Assessment: Absent pedal pulses rule out PAD Calf pain calf tenderness rule out DVT Plantar fasciitis and plantar fibromatosis left  Plan: Informed patient not able to palpate pedal pulses at this time Refer patient to vascular lab immediately for lower extremity arterial Doppler with ABIs and TBI Refer patient to vascular lab medially for lower extremity venous Doppler left rule out DVT Informed patient that if lab positive for DVT would need immediate treatment from primary care physician or ED  Return  patient on receipt of vascular exam

## 2017-05-17 ENCOUNTER — Ambulatory Visit (HOSPITAL_COMMUNITY)
Admission: RE | Admit: 2017-05-17 | Payer: BLUE CROSS/BLUE SHIELD | Source: Ambulatory Visit | Attending: Podiatry | Admitting: Podiatry

## 2017-05-22 ENCOUNTER — Ambulatory Visit (HOSPITAL_COMMUNITY): Payer: BLUE CROSS/BLUE SHIELD

## 2017-07-16 ENCOUNTER — Emergency Department (HOSPITAL_COMMUNITY): Payer: BLUE CROSS/BLUE SHIELD

## 2017-07-16 ENCOUNTER — Emergency Department (HOSPITAL_COMMUNITY)
Admission: EM | Admit: 2017-07-16 | Discharge: 2017-07-16 | Discharge status: Against Medical Advice | Payer: BLUE CROSS/BLUE SHIELD | Attending: Emergency Medicine | Admitting: Emergency Medicine

## 2017-07-16 ENCOUNTER — Encounter (HOSPITAL_COMMUNITY): Payer: Self-pay

## 2017-07-16 DIAGNOSIS — R1012 Left upper quadrant pain: Secondary | ICD-10-CM | POA: Diagnosis not present

## 2017-07-16 DIAGNOSIS — I10 Essential (primary) hypertension: Secondary | ICD-10-CM | POA: Diagnosis not present

## 2017-07-16 DIAGNOSIS — R1013 Epigastric pain: Secondary | ICD-10-CM | POA: Diagnosis present

## 2017-07-16 DIAGNOSIS — Z79899 Other long term (current) drug therapy: Secondary | ICD-10-CM | POA: Diagnosis not present

## 2017-07-16 DIAGNOSIS — F17210 Nicotine dependence, cigarettes, uncomplicated: Secondary | ICD-10-CM | POA: Diagnosis not present

## 2017-07-16 LAB — URINALYSIS, ROUTINE W REFLEX MICROSCOPIC
BACTERIA UA: NONE SEEN
BILIRUBIN URINE: NEGATIVE
Glucose, UA: NEGATIVE mg/dL
KETONES UR: NEGATIVE mg/dL
LEUKOCYTES UA: NEGATIVE
NITRITE: NEGATIVE
PROTEIN: NEGATIVE mg/dL
SQUAMOUS EPITHELIAL / LPF: NONE SEEN
Specific Gravity, Urine: 1.005 (ref 1.005–1.030)
pH: 5 (ref 5.0–8.0)

## 2017-07-16 LAB — CBC WITH DIFFERENTIAL/PLATELET
BASOS PCT: 0 %
Basophils Absolute: 0 10*3/uL (ref 0.0–0.1)
EOS ABS: 0.2 10*3/uL (ref 0.0–0.7)
EOS PCT: 2 %
HCT: 40.9 % (ref 39.0–52.0)
HEMOGLOBIN: 14.3 g/dL (ref 13.0–17.0)
LYMPHS PCT: 42 %
Lymphs Abs: 3.2 10*3/uL (ref 0.7–4.0)
MCH: 28 pg (ref 26.0–34.0)
MCHC: 35 g/dL (ref 30.0–36.0)
MCV: 80.2 fL (ref 78.0–100.0)
MONOS PCT: 10 %
Monocytes Absolute: 0.8 10*3/uL (ref 0.1–1.0)
NEUTROS PCT: 46 %
Neutro Abs: 3.5 10*3/uL (ref 1.7–7.7)
Platelets: 345 10*3/uL (ref 150–400)
RBC: 5.1 MIL/uL (ref 4.22–5.81)
RDW: 15.2 % (ref 11.5–15.5)
WBC: 7.7 10*3/uL (ref 4.0–10.5)

## 2017-07-16 LAB — I-STAT CHEM 8, ED
BUN: 12 mg/dL (ref 6–20)
CALCIUM ION: 1.09 mmol/L — AB (ref 1.15–1.40)
CHLORIDE: 104 mmol/L (ref 101–111)
Creatinine, Ser: 1 mg/dL (ref 0.61–1.24)
Glucose, Bld: 88 mg/dL (ref 65–99)
HCT: 44 % (ref 39.0–52.0)
Hemoglobin: 15 g/dL (ref 13.0–17.0)
Potassium: 4.5 mmol/L (ref 3.5–5.1)
SODIUM: 137 mmol/L (ref 135–145)
TCO2: 27 mmol/L (ref 22–32)

## 2017-07-16 LAB — COMPREHENSIVE METABOLIC PANEL
ALBUMIN: 4.1 g/dL (ref 3.5–5.0)
ANION GAP: 7 (ref 5–15)
Alkaline Phosphatase: 32 U/L — ABNORMAL LOW (ref 38–126)
BUN: 10 mg/dL (ref 6–20)
CHLORIDE: 102 mmol/L (ref 101–111)
CO2: 22 mmol/L (ref 22–32)
Calcium: 8.8 mg/dL — ABNORMAL LOW (ref 8.9–10.3)
Creatinine, Ser: 1.01 mg/dL (ref 0.61–1.24)
GFR calc Af Amer: >60 mL/min (ref >60)
GFR calc non Af Amer: >60 mL/min (ref >60)
GLUCOSE: 75 mg/dL (ref 65–99)
SODIUM: 131 mmol/L — AB (ref 135–145)
Total Bilirubin: 6.1 mg/dL — ABNORMAL HIGH (ref 0.3–1.2)

## 2017-07-16 LAB — CBC
HEMATOCRIT: 43.6 % (ref 39.0–52.0)
HEMOGLOBIN: 13.9 g/dL (ref 13.0–17.0)
MCH: 25.6 pg — ABNORMAL LOW (ref 26.0–34.0)
MCHC: 31.9 g/dL (ref 30.0–36.0)
MCV: 80.4 fL (ref 78.0–100.0)
Platelets: 337 10*3/uL (ref 150–400)
RBC: 5.42 MIL/uL (ref 4.22–5.81)
RDW: 15.8 % — ABNORMAL HIGH (ref 11.5–15.5)
WBC: 7.8 10*3/uL (ref 4.0–10.5)

## 2017-07-16 LAB — LIPASE, BLOOD: Lipase: 112 U/L — ABNORMAL HIGH (ref 11–51)

## 2017-07-16 MED ORDER — MORPHINE SULFATE (PF) 4 MG/ML IV SOLN
4.0000 mg | Freq: Once | INTRAVENOUS | Status: DC
  Filled 2017-07-16: qty 1

## 2017-07-16 MED ORDER — SODIUM CHLORIDE 0.9 % IV BOLUS (SEPSIS)
1000.0000 mL | Freq: Once | INTRAVENOUS | Status: AC
  Administered 2017-07-16: 1000 mL via INTRAVENOUS

## 2017-07-16 MED ORDER — ONDANSETRON HCL 4 MG/2ML IJ SOLN
4.0000 mg | Freq: Once | INTRAMUSCULAR | Status: AC
  Administered 2017-07-16: 4 mg via INTRAVENOUS
  Filled 2017-07-16: qty 2

## 2017-07-16 MED ORDER — HYDROMORPHONE HCL 1 MG/ML IJ SOLN
1.0000 mg | Freq: Once | INTRAMUSCULAR | Status: AC
  Administered 2017-07-16: 1 mg via INTRAVENOUS
  Filled 2017-07-16: qty 1

## 2017-07-16 MED ORDER — HYDROCODONE-ACETAMINOPHEN 5-325 MG PO TABS: 1.0000 | ORAL_TABLET | ORAL | 0 refills | Status: DC | PRN

## 2017-07-16 MED ORDER — ONDANSETRON 4 MG PO TBDP: 8.0000 mg | ORAL_TABLET | Freq: Three times a day (TID) | ORAL | 0 refills | Status: DC | PRN

## 2017-07-16 MED ORDER — IOPAMIDOL (ISOVUE-300) INJECTION 61%
INTRAVENOUS | Status: AC
Start: 2017-07-16 — End: 2017-07-16
  Administered 2017-07-16: 100 mL
  Filled 2017-07-16: qty 100

## 2017-07-16 NOTE — Discharge Instructions (Signed)
Your imaging of your abdomen is reassuring. Your labs are reassuring. You need to have your blood work repeated this to make sure that your liver enzymes and pancrease enzymes do not elevate. Your bilirubin is also elevated you need to have this blood work rechecked. THis can be a life threatening emergency and you accept the risk of leaving and possible death.

## 2017-07-16 NOTE — ED Notes (Signed)
Lab called-- states labs are hemolyzed-- will redraw-- per Anell Barr, RN

## 2017-07-16 NOTE — ED Triage Notes (Signed)
Pt presents to the ed with complaints of abdominal pain all over primarily on his left side into his back. Reports some nausea. Pt states that this feels like when he had pancreatitis. Pt is in no distress in triage.

## 2017-07-16 NOTE — ED Notes (Signed)
Spoke with CT, pt is next on the list

## 2017-07-16 NOTE — ED Provider Notes (Signed)
[facility]EMERGENCY DEPT Provider Note   CSN: [id number] Arrival date & time: 07/16/17  4098     History   Chief Complaint Chief Complaint  Patient presents with  . Abdominal Pain    HPI Jordan [name] is a 51 y.o. male.  HPI 51 year old African-American male past medical history significant for pancreatitis, GERD, hypertension not currently taking medications that presents to the emergency Department today with complaints of abdominal pain. Patient states his pain is in epigastric region and left upper quadrant radiates to his back. States this feels like his typical pancreatitis flare. He reports nausea but denies any emesis. Patient had tried nothing for his symptoms at home. Nothing makes better or worse. Pain is not associated with food. He reports normal bowel movements. No diarrhea, melena, hematochezia. Denies any urinary symptoms.   Pt denies any fever, chill, ha, vision changes, lightheadedness, dizziness, congestion, neck pain, cp, sob, cough, v/d, urinary symptoms, change in bowel habits, melena, hematochezia, lower extremity paresthesias.  Past Medical History:  Diagnosis Date  . Arthritis    right elbow pain all the time, took cortisone shot for  . Elevated blood pressure reading with diagnosis of hypertension   . GERD (gastroesophageal reflux disease)   . Pancreatitis 12-29-2013    Patient Active Problem List   Diagnosis Date Noted  . Essential hypertension, benign 01/26/2017  . Hyperkalemia 01/22/2017  . Left knee pain 01/21/2017  . Acute pancreatitis 08/15/2016  . Elevated blood pressure reading with diagnosis of hypertension 08/15/2016  . Hypertriglyceridemia 12/31/2013  . Abdominal pain 12/29/2013  . Nausea and vomiting 12/29/2013  . Tobacco use 12/29/2013  . Leukocytosis 12/29/2013    Past Surgical History:  Procedure Laterality Date  . EUS N/A 08/12/2014   Procedure: ESOPHAGEAL ENDOSCOPIC ULTRASOUND (EUS) RADIAL;  Surgeon: Willis Modena, MD;  Location: [facility]  ENDOSCOPY;  Service: Endoscopy;  Laterality: N/A;  . FINE NEEDLE ASPIRATION N/A 08/12/2014   Procedure: FINE NEEDLE ASPIRATION (FNA) LINEAR;  Surgeon: Willis Modena, MD;  Location: [facility] ENDOSCOPY;  Service: Endoscopy;  Laterality: N/A;  . IR GENERIC HISTORICAL  01/23/2017   IR US GUIDE VASC ACCESS RIGHT 01/23/2017 [name], MD [facility]  . IR GENERIC HISTORICAL  01/23/2017   IR FLUORO GUIDE CV LINE RIGHT 01/23/2017 [name], MD [facility]  . NO PAST SURGERIES         Home Medications    Prior to Admission medications   Medication Sig Start Date End Date Taking? Authorizing Provider  acetaminophen (TYLENOL) 325 MG tablet Take 650 mg by mouth every 6 (six) hours as needed for moderate pain or headache.   Yes [provider]  RaNITidine HCl (ACID REDUCER PO) Take 1 capsule by mouth daily as needed (for acid reflux).    Yes [provider]  amLODipine (NORVASC) 10 MG tablet Take 1 tablet (10 mg total) by mouth daily. Patient not taking: Reported on 03/05/2017 01/27/17   Dhungel, Theda Belfast, MD  fenofibrate 160 MG tablet Take 1 tablet (160 mg total) by mouth daily. Patient not taking: Reported on 07/16/2017 01/27/17   Dhungel, Theda Belfast, MD  HYDROcodone-acetaminophen (NORCO/VICODIN) 5-325 MG tablet Take 1-2 tablets by mouth every 4 (four) hours as needed. 07/16/17   Rise Mu, PA-C  nicotine (NICODERM CQ - DOSED IN MG/24 HOURS) 21 mg/24hr patch Place 1 patch (21 mg total) onto the skin daily. Patient not taking: Reported on 03/05/2017 01/27/17   Dhungel, Theda Belfast, MD  ondansetron (ZOFRAN-ODT) 4 MG disintegrating tablet Take 2 tablets (8 mg  total) by mouth every 8 (eight) hours as needed for nausea. 07/16/17   Rise Mu, PA-C    Family History Family History  Problem Relation Age of Onset  . Other Mother        Healthy  . Other Father        Healthy  . Pancreatitis Neg Hx     Social History Social History  Substance Use Topics  . Smoking status: Current  Every Day Smoker    Packs/day: 1.00    Years: 38.00    Types: Cigarettes  . Smokeless tobacco: Never Used  . Alcohol use Yes     Comment: little to no     Allergies   Fish allergy   Review of Systems Review of Systems  Constitutional: Negative for chills and fever.  HENT: Negative for congestion and sore throat.   Eyes: Negative for visual disturbance.  Respiratory: Negative for cough and shortness of breath.   Cardiovascular: Negative for chest pain.  Gastrointestinal: Positive for abdominal pain and nausea. Negative for diarrhea and vomiting.  Genitourinary: Negative for dysuria, flank pain, frequency, hematuria, scrotal swelling, testicular pain and urgency.  Musculoskeletal: Negative for arthralgias and myalgias.  Skin: Negative for rash.  Neurological: Negative for dizziness, syncope, weakness, light-headedness, numbness and headaches.  Psychiatric/Behavioral: Negative for sleep disturbance. The patient is not nervous/anxious.      Physical Exam Updated Vital Signs BP (!) 173/116 (BP Location: Right Arm) Comment: Simultaneous filing. User may not have seen previous data.  Pulse (!) 51 Comment: Simultaneous filing. User may not have seen previous data.  Temp 97.9 F (36.6 C) (Oral)   Resp 14   Wt 84.8 kg (187 lb)   SpO2 100%   BMI 31.12 kg/m   Physical Exam  Constitutional: He is oriented to person, place, and time. He appears well-developed and well-nourished.  Non-toxic appearance. No distress.  HENT:  Head: Normocephalic and atraumatic.  Mouth/Throat: Oropharynx is clear and moist.  Eyes: Pupils are equal, round, and reactive to light. Conjunctivae are normal. Right eye exhibits no discharge. Left eye exhibits no discharge.  Neck: Normal range of motion. Neck supple.  Cardiovascular: Normal rate, regular rhythm, normal heart sounds and intact distal pulses.  Exam reveals no gallop and no friction rub.   No murmur heard. Pulmonary/Chest: Effort normal and  breath sounds normal. No respiratory distress. He has no wheezes. He has no rales. He exhibits no tenderness.  Abdominal: Soft. Bowel sounds are normal. He exhibits no distension and no pulsatile midline mass. There is generalized tenderness and tenderness in the right upper quadrant and left upper quadrant. There is no rigidity, no rebound, no guarding, no CVA tenderness, no tenderness at McBurney's point and negative Murphy's sign.  Musculoskeletal: Normal range of motion. He exhibits no tenderness.  Lymphadenopathy:    He has no cervical adenopathy.  Neurological: He is alert and oriented to person, place, and time.  Skin: Skin is warm and dry. Capillary refill takes less than 2 seconds. No rash noted.  Psychiatric: His behavior is normal. Judgment and thought content normal.  Nursing note and vitals reviewed.    ED Treatments / Results  Labs (all labs ordered are listed, but only abnormal results are displayed) Labs Reviewed  CBC - Abnormal; Notable for the following:       Result Value   MCH 25.6 (*)    RDW 15.8 (*)    All other components within normal limits  URINALYSIS, ROUTINE W REFLEX MICROSCOPIC -  Abnormal; Notable for the following:    APPearance HAZY (*)    Hgb urine dipstick SMALL (*)    All other components within normal limits  COMPREHENSIVE METABOLIC PANEL - Abnormal; Notable for the following:    Sodium 131 (*)    Potassium >7.5 (*)    Calcium 8.8 (*)    Alkaline Phosphatase 32 (*)    Total Bilirubin 6.1 (*)    All other components within normal limits  LIPASE, BLOOD - Abnormal; Notable for the following:    Lipase 112 (*)    All other components within normal limits  I-STAT CHEM 8, ED - Abnormal; Notable for the following:    Calcium, Ion 1.09 (*)    All other components within normal limits  CBC WITH DIFFERENTIAL/PLATELET    EKG  EKG Interpretation None       Radiology Ct Abdomen Pelvis W Contrast  Result Date: 07/16/2017 CLINICAL DATA:   51 year old male with abdominal pain, nausea, abnormal laboratory tests. EXAM: CT ABDOMEN AND PELVIS WITH CONTRAST TECHNIQUE: Multidetector CT imaging of the abdomen and pelvis was performed using the standard protocol following bolus administration of intravenous contrast. CONTRAST:  ISOVUE-300 IOPAMIDOL (ISOVUE-300) INJECTION 61% COMPARISON:  01/23/2017 FINDINGS: Lower chest: No acute abnormality. Hepatobiliary: There is diffuse fatty infiltration of the liver. No focal liver abnormality is seen. No gallstones, gallbladder wall thickening, or biliary dilatation. Pancreas: There has been interval improvement in the inflammatory changes of the pancreas compared to a prior examination. No significant residual inflammatory changes are identified at the pancreatic head or adjacent duodenum. The pancreas enhances homogeneously. No adjacent fluid collections or pseudocysts. Spleen: Normal in size without focal abnormality. Adrenals/Urinary Tract: Adrenal glands are unremarkable. Kidneys are normal, without renal calculi, focal lesion, or hydronephrosis. Bladder is unremarkable. Stomach/Bowel: Stomach is within normal limits. Appendix appears normal. No evidence of bowel wall thickening, distention, or inflammatory changes. Vascular/Lymphatic: Aortic atherosclerosis. No enlarged abdominal or pelvic lymph nodes. Reproductive: Prostate is unremarkable. Other: There are bilateral inguinal hernias. The right-sided hernia now contains a loop of small bowel without evidence for proximal obstruction at this time. No abdominopelvic ascites. Musculoskeletal: No acute or significant osseous findings. IMPRESSION: 1. No CT evidence for acute intra-abdominal process to explain the patient's clinical symptoms. 2. There has been marked interval improvement of the inflammatory changes at the pancreas compared to prior examination, with no significant residual inflammatory changes remaining. The pancreas enhances homogeneously with  no adjacent fluid collections or pseudocysts. 3. A right-sided inguinal hernia now contains a loop of small bowel without evidence for proximal obstruction at this time. 4. Hepatic steatosis. 5. Aortic atherosclerosis. Electronically Signed   By: Sande Brothers M.D.   On: 07/16/2017 14:47    Procedures Procedures (including critical care time)  Medications Ordered in ED Medications  ondansetron (ZOFRAN) injection 4 mg (4 mg Intravenous Given 07/16/17 1150)  sodium chloride 0.9 % bolus 1,000 mL (0 mLs Intravenous Stopped 07/16/17 1526)  HYDROmorphone (DILAUDID) injection 1 mg (1 mg Intravenous Given 07/16/17 1153)  iopamidol (ISOVUE-300) 61 % injection (100 mLs  Contrast Given 07/16/17 1421)     Initial Impression / Assessment and Plan / ED Course  I have reviewed the triage vital signs and the nursing notes.  Pertinent labs & imaging results that were available during my care of the patient were reviewed by me and considered in my medical decision making (see chart for details).     Patient presents to the emergency Department today with complaints  of generalized abdominal pain specifically in the epigastric and right upper quadrant region. Patient states he has had pancreatitis in the past and this feels similar. He reports associated nausea but denies any emesis, diarrhea, melena, hematochezia, fevers.  Patient is overall well-appearing and nontoxic. Vital signs are reassuring. Patient is afebrile, no tachycardia or hypotension. The patient is hypertensive but states that he does not taking medications for blood pressure. Does have a history of high blood pressure but states he stopped taking them because it was not high.  Initial lab work was obtained. Initial CMP revealed a potassium of 7.5 with gross hemolysis. No leukocytosis was noted. Lipase mildly elevated at 112. UA showed no signs of infection. Repeat i-STAT Chem-8 revealed normal potassium. AST and ALT were not able to be determined  due to gross hemolysis. The patient's total bilirubin is 6.1 history of elevated bilirubin however this seems markedly elevated.  CT scan was obtained. Showed no acute on her maladies. Does note improving inflammatory changes of the pancreas. Does note inguinal hernia without any signs of obstruction. Patient informed of findings.  Discussed with patient that I will like to repeat his labs to obtain his liver enzymes and repeat his bilirubin. Concern for possible choledocholithiasis versus cholangitis. Patient refused to stay for repeat blood draw. States that he feels significantly improved after pain medicine and nausea medicine and would like to be discharged. States he has an appointment with his primary care doctor in 2 days.   They have decided to leave AMA. I have discussed my concerns as a provider and the possibility that this may worsen. We discussed the nature, risks and benefits, and alternatives to treatment. I have specifically discussed that without further evaluation I cannot guarantee there is not a life threatening event occuring.  Time was given to allow the opportunity to ask questions and consider the options, and after the discussion, the patient decided to refuse the offered treatment. Pt is A&Ox4, his own POA and states understanding of my concerns and the possible consequences. After refusal, I made every reasonable opportunity to treat them to the best of my ability. I have made the patient aware that this is an AMA discharge, but they may return at any time for further evaluation and treatment.  Patient discussed with my attending Dr. Clayborne Dana who is agreeable to above plan.     Final Clinical Impressions(s) / ED Diagnoses   Final diagnoses:  Epigastric pain    New Prescriptions Discharge Medication List as of 07/16/2017  3:24 PM    START taking these medications   Details  HYDROcodone-acetaminophen (NORCO/VICODIN) 5-325 MG tablet Take 1-2 tablets by mouth every 4  (four) hours as needed., Starting Mon 07/16/2017, Print    ondansetron (ZOFRAN-ODT) 4 MG disintegrating tablet Take 2 tablets (8 mg total) by mouth every 8 (eight) hours as needed for nausea., Starting Mon 07/16/2017, Print         Rise Mu, PA-C 07/16/17 1957    Mesner, Barbara Cower, MD 07/17/17 2703

## 2017-07-16 NOTE — ED Notes (Signed)
Patient leaving AMA - he verbalizes understanding of leaving against medical advice and states "it's not my time, if it gets worse, I'll come back." Patient ambulatory with steady gait, no signs of acute distress noted.

## 2018-05-19 IMAGING — CT CT RENAL STONE PROTOCOL
1 series · 1 of 1 positions shown · non-contrast
Comparison: 03/02/2015

CLINICAL DATA: Epigastric pain beginning last night. Nausea,
vomiting.

EXAM:
CT ABDOMEN AND PELVIS WITHOUT CONTRAST
TECHNIQUE: Multidetector CT imaging of the abdomen and pelvis was performed
following the standard protocol without IV contrast.

[Series 100: scout · coronal · 0.6mm · 0.98mm/px · 1 of 1 slices shown]
[im 1/1]
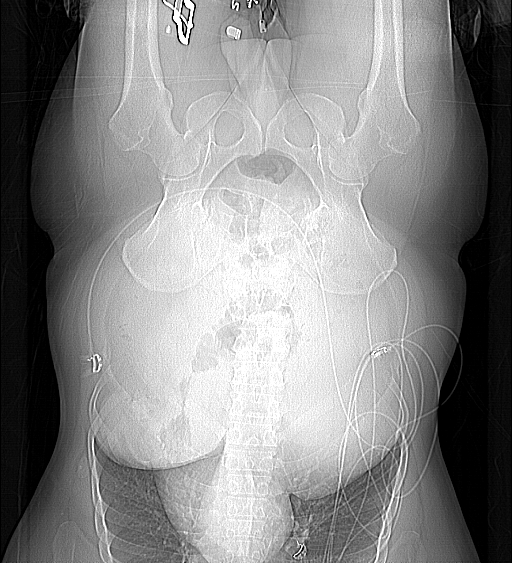

[1 of 1 positions shown; findings below may reference images not displayed]

FINDINGS: Lower chest: Dependent atelectasis or scarring in the lung bases. No
effusions. Heart is upper limits normal in size.

Hepatobiliary: Diffuse fatty infiltration of the liver. No focal
abnormality or biliary ductal dilatation. Gallbladder unremarkable.

Pancreas: No focal abnormality or ductal dilatation.

Spleen: No focal abnormality.  Normal size.

Adrenals/Urinary Tract: No adrenal abnormality. No focal renal
abnormality. No stones or hydronephrosis. Urinary bladder is
unremarkable.

Stomach/Bowel: Normal appendix. Stomach and small bowel
unremarkable. Descending colonic and sigmoid diverticulosis. No
active diverticulitis.

Vascular/Lymphatic: Aortic and iliac calcifications.  No aneurysm.

Reproductive: No visible focal abnormality.

Other: No free fluid or free air. Small bilateral inguinal hernias
containing fat

Musculoskeletal: No acute bony abnormality or focal bone lesion.
IMPRESSION: Fatty infiltration of the liver.

Left colonic diverticulosis.  No active diverticulitis.

No acute findings.

## 2018-12-16 ENCOUNTER — Emergency Department (HOSPITAL_COMMUNITY): Payer: BLUE CROSS/BLUE SHIELD

## 2018-12-16 ENCOUNTER — Encounter (HOSPITAL_COMMUNITY): Payer: Self-pay | Admitting: Emergency Medicine

## 2018-12-16 ENCOUNTER — Other Ambulatory Visit: Payer: Self-pay

## 2018-12-16 ENCOUNTER — Inpatient Hospital Stay (HOSPITAL_COMMUNITY)
Admission: EM | Admit: 2018-12-16 | Discharge: 2018-12-19 | DRG: 439 | Disposition: A | Discharge status: Home / Self Care | Payer: BLUE CROSS/BLUE SHIELD | Attending: Internal Medicine | Admitting: Internal Medicine

## 2018-12-16 DIAGNOSIS — Z79899 Other long term (current) drug therapy: Secondary | ICD-10-CM

## 2018-12-16 DIAGNOSIS — R740 Nonspecific elevation of levels of transaminase and lactic acid dehydrogenase [LDH]: Secondary | ICD-10-CM

## 2018-12-16 DIAGNOSIS — R112 Nausea with vomiting, unspecified: Secondary | ICD-10-CM

## 2018-12-16 DIAGNOSIS — I723 Aneurysm of iliac artery: Secondary | ICD-10-CM | POA: Diagnosis present

## 2018-12-16 DIAGNOSIS — I70201 Unspecified atherosclerosis of native arteries of extremities, right leg: Secondary | ICD-10-CM | POA: Diagnosis not present

## 2018-12-16 DIAGNOSIS — I739 Peripheral vascular disease, unspecified: Secondary | ICD-10-CM | POA: Diagnosis present

## 2018-12-16 DIAGNOSIS — E781 Pure hyperglyceridemia: Secondary | ICD-10-CM | POA: Diagnosis present

## 2018-12-16 DIAGNOSIS — K863 Pseudocyst of pancreas: Secondary | ICD-10-CM | POA: Diagnosis present

## 2018-12-16 DIAGNOSIS — E875 Hyperkalemia: Secondary | ICD-10-CM | POA: Diagnosis present

## 2018-12-16 DIAGNOSIS — I708 Atherosclerosis of other arteries: Secondary | ICD-10-CM | POA: Diagnosis present

## 2018-12-16 DIAGNOSIS — K861 Other chronic pancreatitis: Secondary | ICD-10-CM

## 2018-12-16 DIAGNOSIS — R030 Elevated blood-pressure reading, without diagnosis of hypertension: Secondary | ICD-10-CM | POA: Diagnosis not present

## 2018-12-16 DIAGNOSIS — K869 Disease of pancreas, unspecified: Secondary | ICD-10-CM

## 2018-12-16 DIAGNOSIS — Z91013 Allergy to seafood: Secondary | ICD-10-CM | POA: Diagnosis not present

## 2018-12-16 DIAGNOSIS — K760 Fatty (change of) liver, not elsewhere classified: Secondary | ICD-10-CM

## 2018-12-16 DIAGNOSIS — K858 Other acute pancreatitis without necrosis or infection: Secondary | ICD-10-CM | POA: Diagnosis present

## 2018-12-16 DIAGNOSIS — I10 Essential (primary) hypertension: Secondary | ICD-10-CM | POA: Diagnosis present

## 2018-12-16 DIAGNOSIS — E871 Hypo-osmolality and hyponatremia: Secondary | ICD-10-CM | POA: Diagnosis present

## 2018-12-16 DIAGNOSIS — K219 Gastro-esophageal reflux disease without esophagitis: Secondary | ICD-10-CM | POA: Diagnosis present

## 2018-12-16 DIAGNOSIS — R7401 Elevation of levels of liver transaminase levels: Secondary | ICD-10-CM

## 2018-12-16 DIAGNOSIS — Z7982 Long term (current) use of aspirin: Secondary | ICD-10-CM | POA: Diagnosis not present

## 2018-12-16 DIAGNOSIS — I771 Stricture of artery: Secondary | ICD-10-CM | POA: Diagnosis not present

## 2018-12-16 DIAGNOSIS — K859 Acute pancreatitis without necrosis or infection, unspecified: Secondary | ICD-10-CM | POA: Diagnosis present

## 2018-12-16 DIAGNOSIS — F17210 Nicotine dependence, cigarettes, uncomplicated: Secondary | ICD-10-CM | POA: Diagnosis present

## 2018-12-16 LAB — RENAL FUNCTION PANEL
ANION GAP: 9 (ref 5–15)
Albumin: 4.2 g/dL (ref 3.5–5.0)
BUN: 8 mg/dL (ref 6–20)
CO2: 18 mmol/L — ABNORMAL LOW (ref 22–32)
Calcium: 8.4 mg/dL — ABNORMAL LOW (ref 8.9–10.3)
Chloride: 102 mmol/L (ref 98–111)
Creatinine, Ser: 0.71 mg/dL (ref 0.61–1.24)
GFR calc Af Amer: >60 mL/min (ref >60)
GFR calc non Af Amer: >60 mL/min (ref >60)
Glucose, Bld: 136 mg/dL — ABNORMAL HIGH (ref 70–99)
Phosphorus: >30 mg/dL — ABNORMAL HIGH (ref 2.5–4.6)
Potassium: >7.5 mmol/L (ref 3.5–5.1)
Sodium: 129 mmol/L — ABNORMAL LOW (ref 135–145)

## 2018-12-16 LAB — CBC
HCT: 45.6 % (ref 39.0–52.0)
HEMOGLOBIN: 17.1 g/dL — AB (ref 13.0–17.0)
MCH: 30.7 pg (ref 26.0–34.0)
MCHC: 37.5 g/dL — AB (ref 30.0–36.0)
MCV: 81.9 fL (ref 80.0–100.0)
Platelets: 366 10*3/uL (ref 150–400)
RBC: 5.57 MIL/uL (ref 4.22–5.81)
RDW: 14.2 % (ref 11.5–15.5)
WBC: 10.1 10*3/uL (ref 4.0–10.5)
nRBC: 0 % (ref 0.0–0.2)

## 2018-12-16 LAB — GLUCOSE, CAPILLARY
GLUCOSE-CAPILLARY: 114 mg/dL — AB (ref 70–99)
Glucose-Capillary: 107 mg/dL — ABNORMAL HIGH (ref 70–99)
Glucose-Capillary: 115 mg/dL — ABNORMAL HIGH (ref 70–99)
Glucose-Capillary: 118 mg/dL — ABNORMAL HIGH (ref 70–99)
Glucose-Capillary: 119 mg/dL — ABNORMAL HIGH (ref 70–99)
Glucose-Capillary: 126 mg/dL — ABNORMAL HIGH (ref 70–99)
Glucose-Capillary: 129 mg/dL — ABNORMAL HIGH (ref 70–99)
Glucose-Capillary: 145 mg/dL — ABNORMAL HIGH (ref 70–99)
Glucose-Capillary: 150 mg/dL — ABNORMAL HIGH (ref 70–99)
Glucose-Capillary: 171 mg/dL — ABNORMAL HIGH (ref 70–99)

## 2018-12-16 LAB — COMPREHENSIVE METABOLIC PANEL
ALT: 61 U/L — ABNORMAL HIGH (ref 0–44)
ANION GAP: 12 (ref 5–15)
AST: 84 U/L — AB (ref 15–41)
Albumin: 4.4 g/dL (ref 3.5–5.0)
Alkaline Phosphatase: 42 U/L (ref 38–126)
BILIRUBIN TOTAL: 2.7 mg/dL — AB (ref 0.3–1.2)
BUN: 11 mg/dL (ref 6–20)
CO2: 22 mmol/L (ref 22–32)
Calcium: 9.1 mg/dL (ref 8.9–10.3)
Chloride: 101 mmol/L (ref 98–111)
Creatinine, Ser: 0.98 mg/dL (ref 0.61–1.24)
Glucose, Bld: 124 mg/dL — ABNORMAL HIGH (ref 70–99)
POTASSIUM: 5.5 mmol/L — AB (ref 3.5–5.1)
Sodium: 135 mmol/L (ref 135–145)
TOTAL PROTEIN: 7.7 g/dL (ref 6.5–8.1)

## 2018-12-16 LAB — BASIC METABOLIC PANEL
ANION GAP: 12 (ref 5–15)
BUN: 6 mg/dL (ref 6–20)
CO2: 19 mmol/L — ABNORMAL LOW (ref 22–32)
Calcium: 9.1 mg/dL (ref 8.9–10.3)
Chloride: 99 mmol/L (ref 98–111)
Creatinine, Ser: 0.81 mg/dL (ref 0.61–1.24)
GFR calc non Af Amer: >60 mL/min (ref >60)
Glucose, Bld: 109 mg/dL — ABNORMAL HIGH (ref 70–99)
Potassium: 5.5 mmol/L — ABNORMAL HIGH (ref 3.5–5.1)
Sodium: 130 mmol/L — ABNORMAL LOW (ref 135–145)

## 2018-12-16 LAB — TRIGLYCERIDES
Triglycerides: 3351 mg/dL — ABNORMAL HIGH (ref <150)
Triglycerides: 1901 mg/dL — ABNORMAL HIGH (ref <150)

## 2018-12-16 LAB — URINALYSIS, ROUTINE W REFLEX MICROSCOPIC
BACTERIA UA: NONE SEEN
BILIRUBIN URINE: NEGATIVE
GLUCOSE, UA: NEGATIVE mg/dL
KETONES UR: 5 mg/dL — AB
LEUKOCYTES UA: NEGATIVE
NITRITE: NEGATIVE
PROTEIN: NEGATIVE mg/dL
Specific Gravity, Urine: 1.02 (ref 1.005–1.030)
pH: 7 (ref 5.0–8.0)

## 2018-12-16 LAB — TROPONIN I

## 2018-12-16 LAB — LIPASE, BLOOD: LIPASE: 65 U/L — AB (ref 11–51)

## 2018-12-16 LAB — POTASSIUM: Potassium: 6.9 mmol/L (ref 3.5–5.1)

## 2018-12-16 MED ORDER — ACETAMINOPHEN 325 MG PO TABS
650.0000 mg | ORAL_TABLET | Freq: Four times a day (QID) | ORAL | Status: DC | PRN
  Administered 2018-12-16 – 2018-12-17 (×2): 650 mg via ORAL
  Filled 2018-12-16 (×2): qty 2

## 2018-12-16 MED ORDER — HYDROCHLOROTHIAZIDE 12.5 MG PO CAPS
12.5000 mg | ORAL_CAPSULE | Freq: Every day | ORAL | Status: DC
  Administered 2018-12-16: 12.5 mg via ORAL
  Filled 2018-12-16: qty 1

## 2018-12-16 MED ORDER — INSULIN REGULAR BOLUS VIA INFUSION
0.0000 [IU] | Freq: Three times a day (TID) | INTRAVENOUS | Status: DC
  Filled 2018-12-16: qty 10

## 2018-12-16 MED ORDER — HYDROMORPHONE HCL 1 MG/ML IJ SOLN
1.0000 mg | Freq: Once | INTRAMUSCULAR | Status: AC
  Administered 2018-12-16: 1 mg via INTRAVENOUS
  Filled 2018-12-16: qty 1

## 2018-12-16 MED ORDER — HYDRALAZINE HCL 20 MG/ML IJ SOLN
5.0000 mg | Freq: Once | INTRAMUSCULAR | Status: AC
  Administered 2018-12-16: 5 mg via INTRAVENOUS
  Filled 2018-12-16: qty 1

## 2018-12-16 MED ORDER — DEXTROSE 5 % IV SOLN
INTRAVENOUS | Status: DC
  Administered 2018-12-16 – 2018-12-18 (×5): via INTRAVENOUS

## 2018-12-16 MED ORDER — SODIUM CHLORIDE 0.9 % IV SOLN: INTRAVENOUS | Status: DC

## 2018-12-16 MED ORDER — SODIUM CHLORIDE 0.9 % IV SOLN
INTRAVENOUS | Status: DC
  Administered 2018-12-16: 12:00:00 via INTRAVENOUS

## 2018-12-16 MED ORDER — INSULIN (MYXREDLIN) INFUSION FOR HYPERTRIGLYCERIDEMIA
0.1000 [IU]/kg/h | INTRAVENOUS | Status: DC
  Administered 2018-12-16 – 2018-12-18 (×4): 0.1 [IU]/kg/h via INTRAVENOUS
  Filled 2018-12-16 (×7): qty 100

## 2018-12-16 MED ORDER — SENNOSIDES-DOCUSATE SODIUM 8.6-50 MG PO TABS: 1.0000 | ORAL_TABLET | Freq: Every evening | ORAL | Status: DC | PRN

## 2018-12-16 MED ORDER — ENOXAPARIN SODIUM 40 MG/0.4ML ~~LOC~~ SOLN
40.0000 mg | SUBCUTANEOUS | Status: DC
  Administered 2018-12-16 – 2018-12-18 (×3): 40 mg via SUBCUTANEOUS
  Filled 2018-12-16 (×3): qty 0.4

## 2018-12-16 MED ORDER — HYDROMORPHONE HCL 1 MG/ML IJ SOLN
1.0000 mg | Freq: Once | INTRAMUSCULAR | Status: AC
Start: 2018-12-16 — End: 2018-12-16
  Administered 2018-12-16: 1 mg via INTRAVENOUS
  Filled 2018-12-16: qty 1

## 2018-12-16 MED ORDER — ONDANSETRON HCL 4 MG/2ML IJ SOLN
4.0000 mg | Freq: Four times a day (QID) | INTRAMUSCULAR | Status: DC | PRN
  Administered 2018-12-16 – 2018-12-17 (×4): 4 mg via INTRAVENOUS
  Filled 2018-12-16 (×5): qty 2

## 2018-12-16 MED ORDER — DEXTROSE-NACL 5-0.45 % IV SOLN
INTRAVENOUS | Status: DC
  Administered 2018-12-16: 14:00:00 via INTRAVENOUS

## 2018-12-16 MED ORDER — ONDANSETRON HCL 4 MG/2ML IJ SOLN
4.0000 mg | Freq: Once | INTRAMUSCULAR | Status: AC
  Administered 2018-12-16: 4 mg via INTRAVENOUS
  Filled 2018-12-16: qty 2

## 2018-12-16 MED ORDER — SODIUM CHLORIDE 0.9 % IV BOLUS
500.0000 mL | Freq: Once | INTRAVENOUS | Status: AC
  Administered 2018-12-16: 500 mL via INTRAVENOUS

## 2018-12-16 MED ORDER — METOCLOPRAMIDE HCL 5 MG/ML IJ SOLN
10.0000 mg | Freq: Once | INTRAMUSCULAR | Status: AC
  Administered 2018-12-16: 10 mg via INTRAVENOUS
  Filled 2018-12-16: qty 2

## 2018-12-16 MED ORDER — HYDROMORPHONE HCL 1 MG/ML IJ SOLN
1.0000 mg | INTRAMUSCULAR | Status: DC | PRN
  Administered 2018-12-16 – 2018-12-19 (×14): 1 mg via INTRAVENOUS
  Filled 2018-12-16 (×15): qty 1

## 2018-12-16 MED ORDER — ACETAMINOPHEN 650 MG RE SUPP: 650.0000 mg | Freq: Four times a day (QID) | RECTAL | Status: DC | PRN

## 2018-12-16 MED ORDER — LABETALOL HCL 5 MG/ML IV SOLN
10.0000 mg | Freq: Four times a day (QID) | INTRAVENOUS | Status: DC | PRN
  Administered 2018-12-16 – 2018-12-17 (×2): 10 mg via INTRAVENOUS
  Filled 2018-12-16 (×2): qty 4

## 2018-12-16 MED ORDER — IOHEXOL 300 MG/ML  SOLN
100.0000 mL | Freq: Once | INTRAMUSCULAR | Status: AC
  Administered 2018-12-16: 100 mL via INTRAVENOUS

## 2018-12-16 MED ORDER — SODIUM CHLORIDE 0.9 % IV BOLUS
1000.0000 mL | Freq: Once | INTRAVENOUS | Status: AC
  Administered 2018-12-16: 1000 mL via INTRAVENOUS

## 2018-12-16 MED ORDER — INSULIN REGULAR(HUMAN) IN NACL 100-0.9 UT/100ML-% IV SOLN
INTRAVENOUS | Status: DC
  Administered 2018-12-16: 0.9 [IU]/h via INTRAVENOUS
  Filled 2018-12-16 (×3): qty 100

## 2018-12-16 MED ORDER — SODIUM CHLORIDE 0.9% FLUSH: 3.0000 mL | Freq: Once | INTRAVENOUS | Status: DC

## 2018-12-16 MED ORDER — DEXTROSE 50 % IV SOLN: 25.0000 mL | INTRAVENOUS | Status: DC | PRN

## 2018-12-16 NOTE — ED Notes (Signed)
Patient transported to CT 

## 2018-12-16 NOTE — ED Notes (Signed)
Pt was noted to be actively vomiting in the lobby bathroom. Clydie Braun, RN aware and pt informed she will administer zofran as soon as a triage room is available.

## 2018-12-16 NOTE — H&P (Addendum)
Date: 12/16/2018               Patient Name:  Jordan [name] MRN: [id number]  DOB: 1966/03/16 Age / Sex: 53 y.o., male   PCP: Bertha Stakes         Medical Service: Internal Medicine Teaching Service         Attending Physician: Dr. Gust Rung, DO    First Contact: Dr. Criss Alvine Pager: [phone]  Second Contact: Dr. 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 He reports the pain is localized to epigastric region and radiates to the back. He has associated symptoms of nausea. States he last ate Sunday night which included pork and potatoes. He tolerated the meal fine.  He states that the epigastric pain has progressively gotten worse over the past 2 days and prompted him to come to the ED. Last bowel movement was yesterday.He denies fever/chills, vomiting, chest pain, shortness of breath or leg swelling.   Meds: No outpatient medications  Allergies: Allergies as of 12/16/2018 - Review Complete 12/16/2018  Allergen Reaction Noted  . Fish allergy Nausea And Vomiting and Other (See Comments) 11/27/2015   Past Medical History:  Diagnosis Date  . Arthritis    right elbow pain all the time, took cortisone shot for  . Elevated blood pressure reading with diagnosis of hypertension   . GERD (gastroesophageal reflux disease)   . Pancreatitis 12-29-2013    Family History:  Family History  Problem Relation Age of Onset  . Other Mother        Healthy  . Other Father        Healthy  . Pancreatitis Neg Hx      Social History: tobacco abuse: Current smoker. 15-pack-year smoking  history Alcohol use: Denies Illicit drug use: Denies  Review of Systems: A complete ROS was negative except as per HPI.   Physical Exam: Blood pressure (!) 156/100, pulse 72, temperature 98 F (36.7 C), temperature source Oral, resp. rate 16, height 5\' 5"  (1.651 m), weight 81.6 kg, SpO2 100 %. Physical Exam  Constitutional: He is well-developed, well-nourished, and in no distress.  HENT:  Head: Normocephalic and atraumatic.  Eyes: Pupils are equal, round, and reactive to light.  Neck: Normal range of motion. Neck supple.  Cardiovascular: Normal rate, regular rhythm and normal heart sounds. Exam reveals no gallop and no friction rub.  No murmur heard. Pulmonary/Chest: Effort normal and breath sounds normal. No respiratory distress. He has no wheezes. He has no rales. He exhibits no tenderness.  Abdominal: Soft. Bowel sounds are normal. He exhibits distension. He exhibits no mass. There is no rebound and no guarding.  Tenderness to epigastric region  Musculoskeletal:        General: No edema.  Skin: Skin is warm and dry.    EKG: personally reviewed my interpretation is normal sinus rhythm    Assessment & Plan by Problem: Active Problems:   Pancreatitis   Pancreatic lesion  Pancreatitis  Hypertriglyceridemia  CT suggestive of mild pancreatitis  from hypertriglyceridemia.  He denies alcohol use and imaging not suggestive  of biliary obstruction or stone.  A new lesion in the pancreatic neck favoring pseudocyst was noted. Follow up recommended.  Will treat with IV fluids, pain control and anti-emetics. Due to triglycerides in the 3,000s will start insulin drip and monitor triglycerides Q12h. -IV fluids -Nothing by mouth, advance diet as tolerated -Zofran -Dilaudid -triglycerides Q12H  Hyperkalemia K 5.5. Patient received a liter and a half in the ED. On repeat K >7.5 however hemolyzed and excerbatted by lipidemia.  Repeat EKG without peaked t-waves -cardiac monitoring.    Elevated blood pressure Blood pressure is elevated in the setting of abdominal pain. Patient will need primary care physician at discharge and may need to be started on antihypertensives while inpatient. -Monitor and may need the addition of antihypertensives while inpatient. -case manager for pcp  Hepatic Steatosis Noted on CT abd/pelvis.  AST/ALT elevated.  Likely from elevated triglycerides.  Will obtain hepatitis panel. -Hepatitis panel   Dispo: Admit patient to Observation with expected length of stay less than 2 midnights.  Signed: Camelia PhenesHoffman, Liberti Appleton Ratliff, DO 12/16/2018, 10:30 AM  Pager: [phone]

## 2018-12-16 NOTE — ED Triage Notes (Addendum)
C/o sharp mid upper abd pain since 8:30pm with dry heaves and mild nausea.  History of pancreatitis.

## 2018-12-16 NOTE — ED Provider Notes (Signed)
[facility] Note   CSN: [id number] Arrival date & time: 12/16/18  0248     History   Chief Complaint Chief Complaint  Patient presents with  . Abdominal Pain    HPI Jordan [name] is a 53 y.o. male with a hx of arthritis, HTN, GERD, pancreatitis presents to the Emergency Department complaining of gradual, persistent, progressively worsening epigastric abd pain onset 36 hours ago.  Pt reports pain intensified tonight and he began vomiting after arrival in the ED.  Pt states he rarely drinks and his previous episodes have been from high triglycerides.  Pt denies travel, sick contacts.  He reports no treatments PTA.  Nothing makes the symptoms better or worse.  Pt denies fever, chills, headache, neck pain, chest pain, SOB, weakness, dizziness, syncope.     The history is provided by the patient and medical records. No language interpreter was used.    Past Medical History:  Diagnosis Date  . Arthritis    right elbow pain all the time, took cortisone shot for  . Elevated blood pressure reading with diagnosis of hypertension   . GERD (gastroesophageal reflux disease)   . Pancreatitis 12-29-2013    Patient Active Problem List   Diagnosis Date Noted  . Essential hypertension, benign 01/26/2017  . Hyperkalemia 01/22/2017  . Left knee pain 01/21/2017  . Acute pancreatitis 08/15/2016  . Elevated blood pressure reading with diagnosis of hypertension 08/15/2016  . Hypertriglyceridemia 12/31/2013  . Abdominal pain 12/29/2013  . Nausea and vomiting 12/29/2013  . Tobacco use 12/29/2013  . Leukocytosis 12/29/2013    Past Surgical History:  Procedure Laterality Date  . EUS N/A 08/12/2014   Procedure: ESOPHAGEAL ENDOSCOPIC ULTRASOUND (EUS) RADIAL;  Surgeon: Willis ModenaWilliam Outlaw, MD;  Location: [facility] ENDOSCOPY;  Service: Endoscopy;  Laterality: N/A;  . FINE NEEDLE ASPIRATION N/A 08/12/2014   Procedure: FINE NEEDLE ASPIRATION (FNA) LINEAR;  Surgeon:  Willis ModenaWilliam Outlaw, MD;  Location: [facility] ENDOSCOPY;  Service: Endoscopy;  Laterality: N/A;  . IR GENERIC HISTORICAL  01/23/2017   IR US GUIDE VASC ACCESS RIGHT 01/23/2017 Richarda OverlieAdam Henn, MD [facility]  . IR GENERIC HISTORICAL  01/23/2017   IR FLUORO GUIDE CV LINE RIGHT 01/23/2017 Richarda OverlieAdam Henn, MD [facility]  . NO PAST SURGERIES          Home Medications    Prior to Admission medications   Medication Sig Start Date End Date Taking? Authorizing Provider  amLODipine (NORVASC) 10 MG tablet Take 1 tablet (10 mg total) by mouth daily. Patient not taking: Reported on 03/05/2017 01/27/17   Dhungel, Theda BelfastNishant, MD  fenofibrate 160 MG tablet Take 1 tablet (160 mg total) by mouth daily. Patient not taking: Reported on 07/16/2017 01/27/17   Dhungel, Theda BelfastNishant, MD  HYDROcodone-acetaminophen (NORCO/VICODIN) 5-325 MG tablet Take 1-2 tablets by mouth every 4 (four) hours as needed. Patient not taking: Reported on 12/16/2018 07/16/17   Demetrios LollLeaphart, Kenneth T, PA-C  nicotine (NICODERM CQ - DOSED IN MG/24 HOURS) 21 mg/24hr patch Place 1 patch (21 mg total) onto the skin daily. Patient not taking: Reported on 03/05/2017 01/27/17   Dhungel, Theda BelfastNishant, MD  ondansetron (ZOFRAN-ODT) 4 MG disintegrating tablet Take 2 tablets (8 mg total) by mouth every 8 (eight) hours as needed for nausea. Patient not taking: Reported on 12/16/2018 07/16/17   Rise MuLeaphart, Kenneth T, PA-C    Family History Family History  Problem Relation Age of Onset  . Other Mother        Healthy  . Other Father  Healthy  . Pancreatitis Neg Hx     Social History Social History   Tobacco Use  . Smoking status: Current Every Day Smoker    Packs/day: 1.00    Years: 38.00    Pack years: 38.00    Types: Cigarettes  . Smokeless tobacco: Never Used  Substance Use Topics  . Alcohol use: Yes    Comment: little to no  . Drug use: No     Allergies   Fish allergy   Review of Systems Review of Systems  Constitutional: Positive for diaphoresis. Negative for  appetite change, fatigue, fever and unexpected weight change.  HENT: Negative for mouth sores.   Eyes: Negative for visual disturbance.  Respiratory: Negative for cough, chest tightness, shortness of breath and wheezing.   Cardiovascular: Negative for chest pain.  Gastrointestinal: Positive for abdominal pain, nausea and vomiting. Negative for constipation and diarrhea.  Endocrine: Negative for polydipsia, polyphagia and polyuria.  Genitourinary: Negative for dysuria, frequency, hematuria and urgency.  Musculoskeletal: Negative for back pain and neck stiffness.  Skin: Negative for rash.  Allergic/Immunologic: Negative for immunocompromised state.  Neurological: Negative for syncope, light-headedness and headaches.  Hematological: Does not bruise/bleed easily.  Psychiatric/Behavioral: Negative for sleep disturbance. The patient is not nervous/anxious.      Physical Exam Updated Vital Signs BP (!) 185/134 (BP Location: Right Arm)   Pulse 93   Temp 98 F (36.7 C) (Oral)   Resp (!) 24   SpO2 99%   Physical Exam Vitals signs and nursing note reviewed.  Constitutional:      General: He is in acute distress.     Appearance: He is well-developed. He is diaphoretic.     Comments: Awake, alert, distressed,   HENT:     Head: Normocephalic and atraumatic.     Mouth/Throat:     Pharynx: No oropharyngeal exudate.  Eyes:     General: No scleral icterus.    Conjunctiva/sclera: Conjunctivae normal.  Neck:     Musculoskeletal: Normal range of motion and neck supple.  Cardiovascular:     Rate and Rhythm: Normal rate and regular rhythm.  Pulmonary:     Effort: Pulmonary effort is normal. No respiratory distress.     Breath sounds: Normal breath sounds. No wheezing.  Abdominal:     General: Bowel sounds are normal.     Palpations: Abdomen is soft. There is no mass.     Tenderness: There is abdominal tenderness in the right upper quadrant and epigastric area. There is guarding and rebound.   Musculoskeletal: Normal range of motion.  Skin:    General: Skin is warm.  Neurological:     Mental Status: He is alert.     Comments: Speech is clear and goal oriented Moves extremities without ataxia      ED Treatments / Results  Labs (all labs ordered are listed, but only abnormal results are displayed) Labs Reviewed  LIPASE, BLOOD - Abnormal; Notable for the following components:      Result Value   Lipase 65 (*)    All other components within normal limits  COMPREHENSIVE METABOLIC PANEL - Abnormal; Notable for the following components:   Potassium 5.5 (*)    Glucose, Bld 124 (*)    AST 84 (*)    ALT 61 (*)    Total Bilirubin 2.7 (*)    All other components within normal limits  CBC - Abnormal; Notable for the following components:   Hemoglobin 17.1 (*)    MCHC 37.5 (*)  All other components within normal limits  URINALYSIS, ROUTINE W REFLEX MICROSCOPIC  TROPONIN I    EKG None  Radiology No results found.  Procedures Procedures (including critical care time)  Medications Ordered in ED Medications  sodium chloride flush (NS) 0.9 % injection 3 mL (3 mLs Intravenous Not Given 12/16/18 0503)  sodium chloride 0.9 % bolus 500 mL (500 mLs Intravenous New Bag/Given 12/16/18 0611)  sodium chloride 0.9 % bolus 500 mL (0 mLs Intravenous Stopped 12/16/18 0606)  ondansetron (ZOFRAN) injection 4 mg (4 mg Intravenous Given 12/16/18 0511)  HYDROmorphone (DILAUDID) injection 1 mg (1 mg Intravenous Given 12/16/18 0511)  HYDROmorphone (DILAUDID) injection 1 mg (1 mg Intravenous Given 12/16/18 6440)     Initial Impression / Assessment and Plan / ED Course  I have reviewed the triage vital signs and the nursing notes.  Pertinent labs & imaging results that were available during my care of the patient were reviewed by me and considered in my medical decision making (see chart for details).  Clinical Course as of Dec 16 610  Mon Dec 16, 2018  3474 Patient is significantly  hypertensive however is in severe pain.  BP(!): 185/134 [HM]  0550 Hyperkalemia noted.  Patient appears to be more concentrated with elevated hemoglobin as well.  We will give fluids and recheck.  Potassium(!): 5.5 [HM]  0551 The elevated lipase along with elevation in AST and ALT gives rise to concern for cholecystitis versus choledocholithiasis.  Will obtain ultrasound.  Lipase(!): 65 [HM]    Clinical Course User Index [HM] Fardowsa Authier, Dahlia Client, PA-C    Patient presents for epigastric and right upper quadrant abdominal pain.  He states this is his pancreatitis.  Labs are concerning for potential cholecystitis versus choledocholithiasis with elevation in lipase and AST/ALT.  Additionally patient is hemoconcentrated with an elevated potassium.  Fluids given.  Difficulty controlling patient's pain.  He will have abdominal ultrasound.  At shift change care was transferred to [facility], PA-C who will follow pending studies, re-evaulate and determine disposition.     Final Clinical Impressions(s) / ED Diagnoses   Final diagnoses:  Epigastric pain  Elevated transaminase level    ED Discharge Orders    None       Mardene Sayer Boyd Kerbs 12/16/18 0612    Ward, Layla Maw, DO 12/16/18 [phone]

## 2018-12-16 NOTE — ED Provider Notes (Signed)
Received patient at signout from PA Muthersbaugh.  Refer to provider note for full history and physical examination.  Briefly, patient is a 53 year old male with history of arthritis, hypertension, GERD, pancreatitis presents for evaluation of epigastric abdominal pain with nausea and vomiting beginning 36 hours prior to assessment.  He has received pain medicine and nausea medications and with some improvement.  Lab work revealed mildly elevated lipase and elevated LFTs, awaiting right upper quadrant ultrasound to rule out obstructive stone.  Will reassess.  If symptoms are well controlled and imaging is unremarkable, likely stable for discharge home. Physical Exam  BP (!) 154/98   Pulse 72   Temp 98 F (36.7 C) (Oral)   Resp 17   Ht 5\' 5"  (1.651 m)   Wt 81.6 kg   SpO2 90%   BMI 29.95 kg/m   Physical Exam Vitals signs and nursing note reviewed.  Constitutional:      General: He is not in acute distress.    Appearance: He is well-developed.  HENT:     Head: Normocephalic and atraumatic.  Eyes:     General:        Right eye: No discharge.        Left eye: No discharge.     Conjunctiva/sclera: Conjunctivae normal.  Neck:     Vascular: No JVD.     Trachea: No tracheal deviation.  Cardiovascular:     Rate and Rhythm: Normal rate.     Comments: Difficult to palpate peripheral though DP/PT pulses are dopplerable bilaterally. Pulmonary:     Effort: Pulmonary effort is normal.  Abdominal:     General: Abdomen is protuberant. Bowel sounds are normal. There is no distension.     Palpations: Abdomen is soft.     Tenderness: There is abdominal tenderness in the epigastric area. There is guarding. There is no rebound.  Skin:    General: Skin is warm and dry.     Findings: No erythema.  Neurological:     Mental Status: He is alert.  Psychiatric:        Behavior: Behavior normal.     ED Course/Procedures   Clinical Course as of Dec 16 1008  Mon Dec 16, 2018  0175 Patient is  significantly hypertensive however is in severe pain.  BP(!): 185/134 [HM]  0550 Hyperkalemia noted.  Patient appears to be more concentrated with elevated hemoglobin as well.  We will give fluids and recheck.  Potassium(!): 5.5 [HM]  0551 The elevated lipase along with elevation in AST and ALT gives rise to concern for cholecystitis versus choledocholithiasis.  Will obtain ultrasound.  Lipase(!): 65 [HM]    Clinical Course User Index [HM] Muthersbaugh, Dahlia Client, PA-C    Procedures  MDM  Right upper groin ultrasound shows no acute changes but does show hepatic steatosis.  On reassessment, patient reports his pain is persistent and had another episode of emesis while in the room.  Will obtain CT for further evaluation.  CT shows mild pancreatitis as well as an 18 mm lesion in the pancreatic neck favoring pseudocyst.  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  He does report known history of peripheral arterial disease and vascular claudication symptoms which he reports have been worsening chronically.  He has poor peripheral pulses but they are dopplerable.  Given persistent symptoms, intractable nausea and vomiting, uncontrolled pain will admit for management of pancreatitis.  Internal medicine teaching service  to admit.      Jeanie SewerFawze, Afrika Brick A, PA-C 12/16/18 1038    Ward, Layla MawKristen N, DO 12/17/18 0000

## 2018-12-17 DIAGNOSIS — K869 Disease of pancreas, unspecified: Secondary | ICD-10-CM

## 2018-12-17 DIAGNOSIS — I10 Essential (primary) hypertension: Secondary | ICD-10-CM

## 2018-12-17 DIAGNOSIS — K858 Other acute pancreatitis without necrosis or infection: Principal | ICD-10-CM

## 2018-12-17 LAB — COMPREHENSIVE METABOLIC PANEL
ALT: 40 U/L (ref 0–44)
ANION GAP: 14 (ref 5–15)
AST: 49 U/L — ABNORMAL HIGH (ref 15–41)
Albumin: 4.3 g/dL (ref 3.5–5.0)
Alkaline Phosphatase: 39 U/L (ref 38–126)
BUN: <5 mg/dL — ABNORMAL LOW (ref 6–20)
CO2: 24 mmol/L (ref 22–32)
Calcium: 9.5 mg/dL (ref 8.9–10.3)
Chloride: 87 mmol/L — ABNORMAL LOW (ref 98–111)
Creatinine, Ser: 0.6 mg/dL — ABNORMAL LOW (ref 0.61–1.24)
GFR calc Af Amer: >60 mL/min (ref >60)
GFR calc non Af Amer: >60 mL/min (ref >60)
GLUCOSE: 99 mg/dL (ref 70–99)
Potassium: 3.6 mmol/L (ref 3.5–5.1)
Sodium: 125 mmol/L — ABNORMAL LOW (ref 135–145)
Total Bilirubin: 2 mg/dL — ABNORMAL HIGH (ref 0.3–1.2)
Total Protein: 8.5 g/dL — ABNORMAL HIGH (ref 6.5–8.1)

## 2018-12-17 LAB — GLUCOSE, CAPILLARY
GLUCOSE-CAPILLARY: 93 mg/dL (ref 70–99)
GLUCOSE-CAPILLARY: 129 mg/dL — AB (ref 70–99)
GLUCOSE-CAPILLARY: 68 mg/dL — AB (ref 70–99)
GLUCOSE-CAPILLARY: 74 mg/dL (ref 70–99)
GLUCOSE-CAPILLARY: 66 mg/dL — AB (ref 70–99)
GLUCOSE-CAPILLARY: 95 mg/dL (ref 70–99)
GLUCOSE-CAPILLARY: 83 mg/dL (ref 70–99)
Glucose-Capillary: 100 mg/dL — ABNORMAL HIGH (ref 70–99)
Glucose-Capillary: 103 mg/dL — ABNORMAL HIGH (ref 70–99)
Glucose-Capillary: 104 mg/dL — ABNORMAL HIGH (ref 70–99)
Glucose-Capillary: 69 mg/dL — ABNORMAL LOW (ref 70–99)
Glucose-Capillary: 70 mg/dL (ref 70–99)
Glucose-Capillary: 71 mg/dL (ref 70–99)
Glucose-Capillary: 75 mg/dL (ref 70–99)
Glucose-Capillary: 77 mg/dL (ref 70–99)
Glucose-Capillary: 82 mg/dL (ref 70–99)
Glucose-Capillary: 82 mg/dL (ref 70–99)
Glucose-Capillary: 82 mg/dL (ref 70–99)
Glucose-Capillary: 88 mg/dL (ref 70–99)
Glucose-Capillary: 89 mg/dL (ref 70–99)
Glucose-Capillary: 94 mg/dL (ref 70–99)
Glucose-Capillary: 97 mg/dL (ref 70–99)

## 2018-12-17 LAB — TRIGLYCERIDES
Triglycerides: 1108 mg/dL — ABNORMAL HIGH (ref <150)
Triglycerides: 632 mg/dL — ABNORMAL HIGH (ref <150)

## 2018-12-17 LAB — CBC
HCT: 45.1 % (ref 39.0–52.0)
Hemoglobin: 15.6 g/dL (ref 13.0–17.0)
MCH: 27 pg (ref 26.0–34.0)
MCHC: 34.6 g/dL (ref 30.0–36.0)
MCV: 78 fL — ABNORMAL LOW (ref 80.0–100.0)
Platelets: 302 10*3/uL (ref 150–400)
RBC: 5.78 MIL/uL (ref 4.22–5.81)
RDW: 13.8 % (ref 11.5–15.5)
WBC: 16 10*3/uL — ABNORMAL HIGH (ref 4.0–10.5)
nRBC: 0 % (ref 0.0–0.2)

## 2018-12-17 LAB — HEPATITIS B CORE ANTIBODY, TOTAL: Hep B Core Total Ab: NEGATIVE

## 2018-12-17 LAB — HEPATITIS C ANTIBODY (REFLEX)

## 2018-12-17 LAB — HEPATITIS B SURFACE ANTIBODY, QUANTITATIVE: Hep B S AB Quant (Post): <3.1 m[IU]/mL — ABNORMAL LOW (ref >9.9)

## 2018-12-17 LAB — HEPATITIS A ANTIBODY, IGM: Hep A IgM: NEGATIVE

## 2018-12-17 LAB — HIV ANTIBODY (ROUTINE TESTING W REFLEX): HIV Screen 4th Generation wRfx: NONREACTIVE

## 2018-12-17 LAB — HCV COMMENT:

## 2018-12-17 LAB — HEPATITIS B SURFACE ANTIGEN: Hepatitis B Surface Ag: NEGATIVE

## 2018-12-17 MED ORDER — FENOFIBRATE 54 MG PO TABS
54.0000 mg | ORAL_TABLET | Freq: Every day | ORAL | Status: DC
  Administered 2018-12-18 – 2018-12-19 (×2): 54 mg via ORAL
  Filled 2018-12-17 (×3): qty 1

## 2018-12-17 NOTE — Progress Notes (Signed)
   Subjective: Mr. Jordan [name] states that his epigastric abdominal pain continues but has improved. He does believe that the IV dilaudid is helping. He has not eaten anything since admission but is willing to try advancing his diet. He denies   Objective:  Vital signs in last 24 hours: Vitals:   12/16/18 1600 12/16/18 1920 12/16/18 2338 12/17/18 0300  BP: (!) 188/115 (!) 160/92 (!) 155/104 (!) 159/97  Pulse: 64 82 69 68  Resp: 17 17 (!) 24 18  Temp: 98.2 F (36.8 C) 98.2 F (36.8 C) 98.7 F (37.1 C) 98 F (36.7 C)  TempSrc:  Oral Oral Oral  SpO2: 94% (!) 89% 95% 97%  Weight:      Height:       General: Lying in bed in no acute distress Abdomen: Tender to palpation to epigastric area, no tenderness to palpation in all other areas of the abdomen, soft, non-distended MSK: No LE edema bilaterally, non-tender to palpation  Assessment/Plan:  Principal Problem:   Other acute pancreatitis without necrosis or infection Active Problems:   Hypertriglyceridemia   Essential hypertension, benign   Pancreatic lesion  Pancreatitis: 2/2 Hypertriglyceridemia. Triglycerides slowly improving with insulin drip (3351->1901->1108 this morning). Will recheck his triglycerides this afternoon and discontinue insulin and D5 when TG <500. Will also continue pain control and anti-emetics today. - Continue IV dilaudid 1 mg q4h PRN - Continue Tylenol 650 mg q6h PRN - Continue Zofran 4 mg q5h PRN - Start fenofibrate 54 mg QD for long-term treatment of hypertriglyceridemia  Hyperkalemia: Likely due to hemolysis. K normal this morning at 3.6. - Continue cardiac monitoring  Hypertension: Does not take anti-hypertensive's at home. One dose of hydralazine and HCTZ given yesterday. HCTZ was discontinued given that it can cause elevation in TG. Will continue to monitor.  Hepatic steatosis: Noted on CT abd/pelvis. Likely due to hypertriglyceridemia. AST/ALT this morning within acceptable limits. Hepatitis testing  unremarkable. Of note, he has no immunity to hepatitis B and vaccination should be considered at outpatient follow-up visit.   Pancreatic neck lesion: ?Pseudocyst seen on CT. Will need outpatient follow-up.  Dispo: Anticipated discharge in approximately 1-2 days.  Synetta Shadow, MD 12/17/2018, 6:39 AM Pager: [phone]

## 2018-12-18 DIAGNOSIS — I70201 Unspecified atherosclerosis of native arteries of extremities, right leg: Secondary | ICD-10-CM

## 2018-12-18 DIAGNOSIS — I771 Stricture of artery: Secondary | ICD-10-CM

## 2018-12-18 DIAGNOSIS — E871 Hypo-osmolality and hyponatremia: Secondary | ICD-10-CM

## 2018-12-18 LAB — CBC WITH DIFFERENTIAL/PLATELET
ABS IMMATURE GRANULOCYTES: 0.12 10*3/uL — AB (ref 0.00–0.07)
Basophils Absolute: 0 10*3/uL (ref 0.0–0.1)
Basophils Relative: 0 %
Eosinophils Absolute: 0.1 10*3/uL (ref 0.0–0.5)
Eosinophils Relative: 1 %
HCT: 43.3 % (ref 39.0–52.0)
Hemoglobin: 14.7 g/dL (ref 13.0–17.0)
Immature Granulocytes: 1 %
Lymphocytes Relative: 14 %
Lymphs Abs: 2.5 10*3/uL (ref 0.7–4.0)
MCH: 26.2 pg (ref 26.0–34.0)
MCHC: 33.9 g/dL (ref 30.0–36.0)
MCV: 77 fL — ABNORMAL LOW (ref 80.0–100.0)
Monocytes Absolute: 1.5 10*3/uL — ABNORMAL HIGH (ref 0.1–1.0)
Monocytes Relative: 8 %
NEUTROS ABS: 14.4 10*3/uL — AB (ref 1.7–7.7)
NEUTROS PCT: 76 %
Platelets: 273 10*3/uL (ref 150–400)
RBC: 5.62 MIL/uL (ref 4.22–5.81)
RDW: 13.4 % (ref 11.5–15.5)
WBC: 18.6 10*3/uL — ABNORMAL HIGH (ref 4.0–10.5)
nRBC: 0 % (ref 0.0–0.2)

## 2018-12-18 LAB — BASIC METABOLIC PANEL
ANION GAP: 15 (ref 5–15)
BUN: 7 mg/dL (ref 6–20)
CHLORIDE: 83 mmol/L — AB (ref 98–111)
CO2: 25 mmol/L (ref 22–32)
Calcium: 9.5 mg/dL (ref 8.9–10.3)
Creatinine, Ser: 0.83 mg/dL (ref 0.61–1.24)
GFR calc Af Amer: >60 mL/min (ref >60)
GFR calc non Af Amer: >60 mL/min (ref >60)
Glucose, Bld: 63 mg/dL — ABNORMAL LOW (ref 70–99)
Potassium: 3.4 mmol/L — ABNORMAL LOW (ref 3.5–5.1)
Sodium: 123 mmol/L — ABNORMAL LOW (ref 135–145)

## 2018-12-18 LAB — GLUCOSE, CAPILLARY
GLUCOSE-CAPILLARY: 78 mg/dL (ref 70–99)
Glucose-Capillary: 72 mg/dL (ref 70–99)
Glucose-Capillary: 70 mg/dL (ref 70–99)
Glucose-Capillary: 72 mg/dL (ref 70–99)
Glucose-Capillary: 63 mg/dL — ABNORMAL LOW (ref 70–99)
Glucose-Capillary: 65 mg/dL — ABNORMAL LOW (ref 70–99)
Glucose-Capillary: 98 mg/dL (ref 70–99)
Glucose-Capillary: 76 mg/dL (ref 70–99)
Glucose-Capillary: 69 mg/dL — ABNORMAL LOW (ref 70–99)
Glucose-Capillary: 101 mg/dL — ABNORMAL HIGH (ref 70–99)

## 2018-12-18 LAB — TRIGLYCERIDES
Triglycerides: 407 mg/dL — ABNORMAL HIGH (ref <150)
Triglycerides: 543 mg/dL — ABNORMAL HIGH (ref <150)

## 2018-12-18 LAB — OSMOLALITY: Osmolality: 265 mOsm/kg — ABNORMAL LOW (ref 275–295)

## 2018-12-18 MED ORDER — ASPIRIN EC 81 MG PO TBEC
81.0000 mg | DELAYED_RELEASE_TABLET | Freq: Every day | ORAL | Status: DC
  Administered 2018-12-18 – 2018-12-19 (×2): 81 mg via ORAL
  Filled 2018-12-18 (×2): qty 1

## 2018-12-18 MED ORDER — POTASSIUM CHLORIDE CRYS ER 20 MEQ PO TBCR
40.0000 meq | EXTENDED_RELEASE_TABLET | Freq: Once | ORAL | Status: AC
  Administered 2018-12-18: 40 meq via ORAL
  Filled 2018-12-18: qty 2

## 2018-12-18 MED ORDER — BACLOFEN 5 MG HALF TABLET
5.0000 mg | ORAL_TABLET | Freq: Three times a day (TID) | ORAL | Status: AC
  Administered 2018-12-18 (×3): 5 mg via ORAL
  Filled 2018-12-18 (×3): qty 1

## 2018-12-18 NOTE — Progress Notes (Signed)
   Subjective: Jordan [name] is feeling better this morning. His abdominal pain is well controlled on current regimen. Has been able to tolerate liquids and small amounts of apple sauce without worsening pain or nausea. Endorses leg pain with ambulation. States he has known occlusion in his LLE diagnosed at the end of 2018. Seen by vascular surgeon in the past, but unable to follow-up due to insurance.    Objective:  Vital signs in last 24 hours: Vitals:   12/17/18 2235 12/18/18 0000 12/18/18 0332 12/18/18 0713  BP: (!) 158/106 (!) 150/100 (!) 124/59 131/90  Pulse: 92 88 92 85  Resp: (!) 21 17 20 16   Temp: 99.2 F (37.3 C)  99.5 F (37.5 C) 99.6 F (37.6 C)  TempSrc: Oral  Oral Oral  SpO2: 92% 92% (!) 89% 92%  Weight:      Height:       General: awake, alert, lying in bed in NAD CV: RRR; no murmurs, rubs or gallops Abd: BS+; abdomen is soft, non-distended; tenderness improved since yesterday    Assessment/Plan:  Principal Problem:   Other acute pancreatitis without necrosis or infection Active Problems:   Hypertriglyceridemia   Essential hypertension, benign   Pancreatic lesion  Pancreatitis: 2/2 Hypertriglyceridemia. Triglycerides continue to improve on insulin drip (632 yesterday->this mornings TG level pending). If TG <500 will discontinue insulin and D5. Will continue pain medications per below and plan on de-escalating tomorrow.  - Continue IV dilaudid 1 mg q4h PRN - Continue Tylenol 650 mg q6h PRN - Continue Zofran 4 mg q5h PRN - Continue fenofibrate 54 mg  Hypertension: BP within acceptable limits. Will continue to monitor. 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 He underwent dopplers and was asked to return in 3 months. He was unable to do so because of losing his insurance. He was also recommended to take  aspirin but was unaware of this. - Start Aspirin 81 mg QD - Will refer to vascular surgery as an outpatient.   Dispo: Anticipated discharge in approximately 1-2 days.   Jordan Shadow, MD 12/18/2018, 7:52 AM Pager: [phone]

## 2018-12-18 NOTE — Plan of Care (Signed)
°  Problem: Education: °Goal: Knowledge of General Education information will improve °Description: Including pain rating scale, medication(s)/side effects and non-pharmacologic comfort measures °Outcome: Progressing °  °Problem: Clinical Measurements: °Goal: Ability to maintain clinical measurements within normal limits will improve °Outcome: Progressing °  °Problem: Nutrition: °Goal: Adequate nutrition will be maintained °Outcome: Progressing °  °

## 2018-12-19 DIAGNOSIS — Z7982 Long term (current) use of aspirin: Secondary | ICD-10-CM

## 2018-12-19 DIAGNOSIS — Z79899 Other long term (current) drug therapy: Secondary | ICD-10-CM

## 2018-12-19 DIAGNOSIS — I723 Aneurysm of iliac artery: Secondary | ICD-10-CM

## 2018-12-19 LAB — LIPID PANEL
Cholesterol: 321 mg/dL — ABNORMAL HIGH (ref 0–200)
HDL: 28 mg/dL — ABNORMAL LOW (ref >40)
LDL CALC: UNDETERMINED mg/dL (ref 0–99)
Total CHOL/HDL Ratio: 11.5 RATIO
Triglycerides: 547 mg/dL — ABNORMAL HIGH (ref <150)
VLDL: UNDETERMINED mg/dL (ref 0–40)

## 2018-12-19 LAB — CBC WITH DIFFERENTIAL/PLATELET
Abs Immature Granulocytes: 0.05 10*3/uL (ref 0.00–0.07)
Basophils Absolute: 0 10*3/uL (ref 0.0–0.1)
Basophils Relative: 0 %
Eosinophils Absolute: 0.1 10*3/uL (ref 0.0–0.5)
Eosinophils Relative: 1 %
HCT: 45.2 % (ref 39.0–52.0)
HEMOGLOBIN: 15 g/dL (ref 13.0–17.0)
Immature Granulocytes: 1 %
LYMPHS PCT: 30 %
Lymphs Abs: 3.3 10*3/uL (ref 0.7–4.0)
MCH: 26.4 pg (ref 26.0–34.0)
MCHC: 33.2 g/dL (ref 30.0–36.0)
MCV: 79.4 fL — AB (ref 80.0–100.0)
Monocytes Absolute: 1.1 10*3/uL — ABNORMAL HIGH (ref 0.1–1.0)
Monocytes Relative: 10 %
Neutro Abs: 6.5 10*3/uL (ref 1.7–7.7)
Neutrophils Relative %: 58 %
Platelets: 282 10*3/uL (ref 150–400)
RBC: 5.69 MIL/uL (ref 4.22–5.81)
RDW: 13.7 % (ref 11.5–15.5)
WBC: 11 10*3/uL — ABNORMAL HIGH (ref 4.0–10.5)
nRBC: 0 % (ref 0.0–0.2)

## 2018-12-19 LAB — BASIC METABOLIC PANEL
Anion gap: 13 (ref 5–15)
BUN: 14 mg/dL (ref 6–20)
CHLORIDE: 88 mmol/L — AB (ref 98–111)
CO2: 29 mmol/L (ref 22–32)
Calcium: 9.7 mg/dL (ref 8.9–10.3)
Creatinine, Ser: 1 mg/dL (ref 0.61–1.24)
GFR calc Af Amer: >60 mL/min (ref >60)
GFR calc non Af Amer: >60 mL/min (ref >60)
Glucose, Bld: 127 mg/dL — ABNORMAL HIGH (ref 70–99)
Potassium: 4.1 mmol/L (ref 3.5–5.1)
SODIUM: 130 mmol/L — AB (ref 135–145)

## 2018-12-19 MED ORDER — ASPIRIN 81 MG PO TBEC: 81.0000 mg | DELAYED_RELEASE_TABLET | Freq: Every day | ORAL | 0 refills | Status: DC

## 2018-12-19 MED ORDER — FENOFIBRATE 120 MG PO TABS: 120.0000 mg | ORAL_TABLET | Freq: Every day | ORAL | 0 refills | Status: DC

## 2018-12-19 NOTE — Care Management Note (Signed)
Case Management Note  Patient Details  Name: Jordan MillinJutono Fuhr MRN: [id number] Date of Birth: 10/24/1966  Subjective/Objective:  From home, for dc today , NCM scheduled [facility] at 3:10. This information was given go patient along with a brochure and added to AVS.                 Action/Plan: DC when ready.  Expected Discharge Date:  12/19/18               Expected Discharge Plan:  Home/Self Care  In-House Referral:     Discharge planning Services  CM Consult, Follow-up appt scheduled  Post Acute Care Choice:    Choice offered to:     DME Arranged:    DME Agency:     HH Arranged:    HH Agency:     Status of Service:  Completed, signed off  If discussed at [employer] Length of Stay Meetings, dates discussed:    Additional Comments:  Leone Havenaylor, Sheala Dosh Clinton, RN 12/19/2018, 12:04 PM

## 2018-12-19 NOTE — Progress Notes (Signed)
   Subjective:  Jordan [name] was doing well this morning requested to go home.  He denies abdominal pain and has been tolerating a regular diet.  Objective:  Vital signs in last 24 hours: Vitals:   12/18/18 2001 12/18/18 2333 12/19/18 0404 12/19/18 0716  BP: 107/86 127/81 (!) 130/103 105/83  Pulse: 92 70 84 86  Resp: 20 20 20 15   Temp: 98.4 F (36.9 C) 98.6 F (37 C) 98.4 F (36.9 C) 98.8 F (37.1 C)  TempSrc: Oral Oral Oral Oral  SpO2: 97% 92% 90% 96%  Weight:      Height:       General: Well-appearing male, sitting up in bed eating breakfast, no acute distress CV: RRR, no mrg  Pulm: CTAB, no wheezes or crackles  Abd: obese, soft, NTND, normoactive bowel sounds   Assessment/Plan:  Principal Problem:   Other acute pancreatitis without necrosis or infection Active Problems:   Hypertriglyceridemia   Essential hypertension, benign   Pancreatic lesion  # Pancreatitis 2/2 hypertriglyceridemia:Patient has been off insulin gtt for 2 days and continues to improve. Denied abdominal pain today and is tolerating a regular diet.  TG this AM 543.  He was started on fenofibrate during this admission and educated about the side effects.  We recommended him to establish with a PCP now that he has insurance as well as advised lipid profile testing on his children to avoid potential complications of hyperlipidemia in the future.  He is medically stable for discharge today.  # Hypertension:  Diastolic blood pressure elevated above 100.  Not addressed this in the acute setting.  Recommended establishing care with a primary care doctor that can start antihypertensive and follow-up with patient.  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  We have started him on aspirin 81 mg daily and will refer him to vascular surgery now that  he has insurance.   Dispo: Anticipated discharge today.    Burna Cash, MD 12/19/2018, 10:40 AM Pager: [phone]

## 2018-12-19 NOTE — Discharge Summary (Signed)
Name: Jordan [name] MRN: [id number] DOB: 1966/02/29 53 y.o. PCP: Bertha StakesAlthisar, Henry, PA-C  Date of Admission: 12/16/2018  3:03 AM Date of Discharge: 12/19/2018 Attending Physician: Gust RungHoffman, Erik C, DO  Discharge Diagnosis: 1. Pancreatitis  2. Hypertriglyceridemia  3. Peripheral arterial disease 3. Elevated blood pressure   Discharge Medications: Allergies as of 12/19/2018      Reactions   Fish Allergy Nausea And Vomiting, Other (See Comments)   Can eat shrimp & scallops...sometimes crab      Medication List    STOP taking these medications   amLODipine 10 MG tablet Commonly known as:  NORVASC   HYDROcodone-acetaminophen 5-325 MG tablet Commonly known as:  NORCO/VICODIN   nicotine 21 mg/24hr patch Commonly known as:  NICODERM CQ - dosed in mg/24 hours   ondansetron 4 MG disintegrating tablet Commonly known as:  ZOFRAN-ODT     TAKE these medications   aspirin 81 MG EC tablet Take 1 tablet (81 mg total) by mouth daily. Start taking on:  December 20, 2018   Fenofibrate 120 MG Tabs Take 1 tablet (120 mg total) by mouth daily for 30 days. What changed:    medication strength  how much to take       Disposition and follow-up:   Jordan [name] was discharged from [facility] in Stable condition.  At the hospital follow up visit please address:  1.  Please assess for ongoing abdominal pain and N/V. Please assess for compliance with fenofibrate. He will need a repeat lipid profile in 2-3 months. Please consider starting statin as well for PAD and ensure patient has follow up with vascular surgery.   2.  Labs / imaging needed at time of follow-up: none   3.  Pending labs/ test needing follow-up: none   Follow-up Appointments: Follow-up Information    [facility] Follow up on 01/09/2019.   Why:  3:10 for hospital follow up please call [phone] if you need to reschedule Contact information: [address] [place] [phone]          Hospital Course by problem list: 1. Pancreatitis secondary to hypertriglyceridemia: Ms. Susette [name] has a history of recurrent pancreatitis and presented with similar symptoms of abdominal pain, N/V, and poor PO intake. He was found to have an elevated lipase and TG over 3,000 and was started on IV insulin as well as IVF. TGs improved and he was transitioned to fenofibrate as he has a reported intolerance to gemfibrozil. He will need a repeat lipid profile in 2 months. LDL 182, this resulted after patient was discharged. We recommend initiation of a high intensity statin.   2. 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  He was started on aspirin 81 mg QD and referred to vascular surgery.   3. Elevated blood pressure: Patient does not have a history of hypertension, but his blood pressure remained elevated during this admission with systolic BP in the 150-160s. We recommend outpatient evaluation and initiation of therapy if indicated.   Discharge Vitals:   BP 105/83 (BP Location: Left Arm)   Pulse 86   Temp 98.8 F (37.1 C) (Oral)   Resp 15   Ht 5\' 5"  (1.651 m)   Wt 81.6 kg   SpO2 96%   BMI 29.95 kg/m   Pertinent Labs, Studies, and Procedures:   CBC Latest Ref Rng & Units 12/19/2018 12/18/2018 12/17/2018  WBC 4.0 - 10.5 K/uL 11.0(H) 18.6(H) 16.0(H)  Hemoglobin 13.0 - 17.0 g/dL 88.5 02.7 74.1  Hematocrit 39.0 - 52.0 % 45.2 43.3 45.1  Platelets 150 - 400 K/uL 282 273 302   BMP Latest Ref Rng & Units 12/19/2018 12/18/2018 12/17/2018  Glucose 70 - 99 mg/dL 287(O) 67(E) 99  BUN 6 - 20 mg/dL 14 7 <7(M)  Creatinine 0.61 - 1.24 mg/dL 0.94 7.09 6.28(Z)  Sodium 135 - 145 mmol/L 130(L) 123(L) 125(L)  Potassium 3.5 - 5.1 mmol/L 4.1 3.4(L) 3.6  Chloride 98 - 111 mmol/L 88(L) 83(L) 87(L)  CO2 22 - 32 mmol/L 29 25 24   Calcium 8.9 - 10.3 mg/dL 9.7 9.5 9.5   Lipid Panel     Component Value  Date/Time   CHOL 321 (H) 12/19/2018 0303   TRIG 547 (H) 12/19/2018 0303   HDL 28 (L) 12/19/2018 0303   CHOLHDL 11.5 12/19/2018 0303   VLDL UNABLE TO CALCULATE IF TRIGLYCERIDE OVER 400 mg/dL 66/29/4765 4650   LDLCALC UNABLE TO CALCULATE IF TRIGLYCERIDE OVER 400 mg/dL 35/46/5681 2751   Urinalysis    Component Value Date/Time   COLORURINE YELLOW 12/16/2018 0900   APPEARANCEUR CLEAR 12/16/2018 0900   LABSPEC 1.020 12/16/2018 0900   PHURINE 7.0 12/16/2018 0900   GLUCOSEU NEGATIVE 12/16/2018 0900   HGBUR SMALL (A) 12/16/2018 0900   BILIRUBINUR NEGATIVE 12/16/2018 0900   KETONESUR 5 (A) 12/16/2018 0900   PROTEINUR NEGATIVE 12/16/2018 0900   UROBILINOGEN 1.0 03/02/2015 1224   NITRITE NEGATIVE 12/16/2018 0900   LEUKOCYTESUR NEGATIVE 12/16/2018 0900    US Abdomen limited: FINDINGS: Gallbladder: No gallstones or wall thickening visualized. No sonographic Murphy sign noted by sonographer. Common bile duct: Diameter: 5 mm.  Where visualized, no filling defect. Liver: Echogenic liver with sparing at the gallbladder fossa. No focal lesion is seen. There is limited visualization of the deep liver due to diminished acoustic penetration. Portal vein is patent on color Doppler imaging with normal direction of blood flow towards the liver.   CT A/P:  IMPRESSION: 1. Suspect mild pancreatitis. New 18 mm lesion in the pancreatic neck, favor pseudocyst given history of prior pancreatitis. Recommend follow-up to ensure expected evolution. 2. Marked hepatic steatosis. 3. 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   Discharge Instructions: Discharge Instructions    Call MD for:  persistant nausea and vomiting   Complete by:  As directed    Call MD for:  severe uncontrolled pain   Complete by:  As directed    Diet - low sodium heart healthy   Complete by:  As directed    Discharge instructions   Complete  by:  As directed    Jordan [name], Jordan [name] were admitted to the hospital due to pancreatitis caused by very high triglycerides in your blood.  You required medications through the IV to help lower your triglycerides.  We have started you on a medication to keep your triglycerides low called fenofibrate.  It is of extreme importance that you continue taking this at home.  You will take 1 tablet every day.  Please make sure to find a primary care doctor who is able to follow you and can repeat your triglyceride levels in 2 months to make sure the medication is working. Remember to tell your children to get their cholesterol and triglycerides tested.  For the clogged arteries in your leg, please start taking a baby aspirin every day.  We have also referred you to the vascular surgeon, you should be expecting a call from their office to schedule an appointment.  If you do not receive a call make sure to establish with a primary care doctor who can also refer you to vascular surgery as well.  We sent a prescription for aspirin and fenofibrate to [facility] on [address].  Please let us know if you have any questions or concerns.  - Dr. Evelene Croon   Increase activity slowly   Complete by:  As directed       Signed: Burna Cash, MD 12/19/2018, 4:42 PM   Pager: [phone]

## 2018-12-20 LAB — LDL CHOLESTEROL, DIRECT: Direct LDL: 182 mg/dL — ABNORMAL HIGH (ref 0–99)

## 2018-12-21 ENCOUNTER — Other Ambulatory Visit: Payer: Self-pay | Admitting: Internal Medicine

## 2018-12-21 DIAGNOSIS — I739 Peripheral vascular disease, unspecified: Secondary | ICD-10-CM

## 2019-01-02 ENCOUNTER — Telehealth: Payer: Self-pay | Admitting: *Deleted

## 2019-01-02 NOTE — Telephone Encounter (Addendum)
Information was sent through CoverMyMeds for Fenofibrate.  Call to [facility] on yesterday to get PA.  Then call to Pharmacy to get patient's new ID number. [id number].   Awaiting decision from CoverMyMeds.  Angelina Ok, RN 01/02/2019 4:11 PM.  Call to [employer] Fenofibrate was denied.  Patient will need to try and fail the alternatives [place] or Tricor.  Message to be sent to Dr. Lovenia Kim.  Angelina Ok, RN 01/06/2019 10:14 AM.

## 2019-01-06 ENCOUNTER — Other Ambulatory Visit: Payer: Self-pay | Admitting: Internal Medicine

## 2019-01-06 MED ORDER — FENOFIBRATE 145 MG PO TABS: 145.0000 mg | ORAL_TABLET | Freq: Every day | ORAL | 0 refills | Status: DC

## 2019-01-06 NOTE — Telephone Encounter (Signed)
Jordan [name] is not established with [place]. He has a PCP at [facility]. This medication was provided on day of discharge. I discontinued the fenofibrate and sent it a 30-day prescription for Tricor 145 mg QD. He needs to follow up with his PCP. Called patient to discuss but no answer.

## 2019-01-06 NOTE — Progress Notes (Unsigned)
lofr

## 2019-01-09 ENCOUNTER — Inpatient Hospital Stay: Payer: BLUE CROSS/BLUE SHIELD | Admitting: Family Medicine

## 2019-02-17 ENCOUNTER — Encounter (HOSPITAL_COMMUNITY): Payer: BLUE CROSS/BLUE SHIELD

## 2019-02-17 ENCOUNTER — Encounter: Payer: BLUE CROSS/BLUE SHIELD | Admitting: Surgery

## 2019-05-01 ENCOUNTER — Other Ambulatory Visit: Payer: Self-pay

## 2019-05-01 DIAGNOSIS — I739 Peripheral vascular disease, unspecified: Secondary | ICD-10-CM

## 2019-05-05 ENCOUNTER — Inpatient Hospital Stay (HOSPITAL_COMMUNITY): Admission: RE | Admit: 2019-05-05 | Payer: BLUE CROSS/BLUE SHIELD | Source: Ambulatory Visit

## 2019-05-05 ENCOUNTER — Encounter: Payer: Self-pay | Admitting: Family

## 2019-05-05 ENCOUNTER — Encounter: Payer: BLUE CROSS/BLUE SHIELD | Admitting: Surgery

## 2020-02-29 ENCOUNTER — Ambulatory Visit (HOSPITAL_COMMUNITY)
Admission: EM | Admit: 2020-02-29 | Discharge: 2020-02-29 | Disposition: A | Discharge status: Home / Self Care | Payer: 59 | Attending: Physician Assistant | Admitting: Physician Assistant

## 2020-02-29 ENCOUNTER — Encounter (HOSPITAL_COMMUNITY): Payer: Self-pay

## 2020-02-29 ENCOUNTER — Other Ambulatory Visit: Payer: Self-pay

## 2020-02-29 DIAGNOSIS — I10 Essential (primary) hypertension: Secondary | ICD-10-CM | POA: Diagnosis not present

## 2020-02-29 DIAGNOSIS — R6884 Jaw pain: Secondary | ICD-10-CM

## 2020-02-29 DIAGNOSIS — R519 Headache, unspecified: Secondary | ICD-10-CM

## 2020-02-29 MED ORDER — TRAMADOL HCL 50 MG PO TABS: 50.0000 mg | ORAL_TABLET | Freq: Four times a day (QID) | ORAL | 0 refills | Status: AC | PRN

## 2020-02-29 MED ORDER — PREDNISONE 10 MG PO TABS: 30.0000 mg | ORAL_TABLET | Freq: Every day | ORAL | 0 refills | Status: AC

## 2020-02-29 NOTE — ED Provider Notes (Signed)
[facility]    CSN: [id number] Arrival date & time: 02/29/20  1318      History   Chief Complaint Chief Complaint  Patient presents with  . Otalgia    right ear     HPI Jordan [name] is a 54 y.o. male.   Patient presents for 3 days of worsening right-sided ear, neck and jaw pain. Reports symptoms started Thursday rather suddenly. He has had throbbing continuous pain in the right side of his face, behind his jaw and feels like in his ear.  Denies ear drainage.  Denies significant pain with ear movement.  Denies hearing loss or ear fullness.  Denies fever or chills.  Reports it is worse with chewing and opening the mouth.  Reports left-sided dental issues however no history of right-sided dental issues.  Denies recent nasal congestion or cough, sore throat.  Does have a dental appointment this coming Tuesday and primary care follow-up on Friday.  Denies any chest pain, shortness of breath.  No history of similar incidents.  He states he cannot take NSAIDs due to pancreatitis and kidney failure issues.     Past Medical History:  Diagnosis Date  . Arthritis    right elbow pain all the time, took cortisone shot for  . Elevated blood pressure reading with diagnosis of hypertension   . GERD (gastroesophageal reflux disease)   . Pancreatitis 12-29-2013    Patient Active Problem List   Diagnosis Date Noted  . Pancreatic lesion 12/16/2018  . Essential hypertension, benign 01/26/2017  . Hyperkalemia 01/22/2017  . Left knee pain 01/21/2017  . Other acute pancreatitis without necrosis or infection 08/15/2016  . Elevated blood pressure reading with diagnosis of hypertension 08/15/2016  . Hypertriglyceridemia 12/31/2013  . Abdominal pain 12/29/2013  . Nausea and vomiting 12/29/2013  . Tobacco use 12/29/2013  . Leukocytosis 12/29/2013    Past Surgical History:  Procedure Laterality Date  . EUS N/A 08/12/2014   Procedure: ESOPHAGEAL ENDOSCOPIC ULTRASOUND (EUS) RADIAL;   Surgeon: Willis Modena, MD;  Location: [facility];  Service: Endoscopy;  Laterality: N/A;  . FINE NEEDLE ASPIRATION N/A 08/12/2014   Procedure: FINE NEEDLE ASPIRATION (FNA) LINEAR;  Surgeon: Willis Modena, MD;  Location: [facility];  Service: Endoscopy;  Laterality: N/A;  . IR GENERIC HISTORICAL  01/23/2017   IR US GUIDE VASC ACCESS RIGHT 01/23/2017 [name], MD [facility]  . IR GENERIC HISTORICAL  01/23/2017   IR FLUORO GUIDE CV LINE RIGHT 01/23/2017 [name], MD [facility]  . NO PAST SURGERIES         Home Medications    Prior to Admission medications   Medication Sig Start Date End Date Taking? Authorizing Provider  aspirin EC 81 MG EC tablet Take 1 tablet (81 mg total) by mouth daily. 12/20/18   Santos-Sanchez, Chelsea Primus, MD  fenofibrate (TRICOR) 145 MG tablet Take 1 tablet (145 mg total) by mouth daily. 01/06/19 01/06/20  Burna Cash, MD  predniSONE (DELTASONE) 10 MG tablet Take 3 tablets (30 mg total) by mouth daily for 5 days. 02/29/20 03/05/20  Branson Kranz, Veryl Speak, PA-C  traMADol (ULTRAM) 50 MG tablet Take 1 tablet (50 mg total) by mouth every 6 (six) hours as needed for up to 3 days. 02/29/20 03/03/20  Analy Bassford, Veryl Speak, PA-C    Family History Family History  Problem Relation Age of Onset  . Other Mother        Healthy  . Other Father        Healthy  .  Pancreatitis Neg Hx     Social History Social History   Tobacco Use  . Smoking status: Current Every Day Smoker    Packs/day: 1.00    Years: 38.00    Pack years: 38.00    Types: Cigarettes  . Smokeless tobacco: Never Used  Substance Use Topics  . Alcohol use: Yes    Comment: little to no  . Drug use: No     Allergies   Fish allergy   Review of Systems Review of Systems See HPI  Physical Exam Triage Vital Signs ED Triage Vitals  Enc Vitals Group     BP      Pulse      Resp      Temp      Temp src      SpO2      Weight      Height      Head Circumference      Peak Flow      Pain Score       Pain Loc      Pain Edu?      Excl. in GC?    No data found.  Updated Vital Signs BP (!) 187/126 (BP Location: Left Arm)   Pulse 66   Temp 98.7 F (37.1 C) (Oral)   Resp 18   SpO2 97%   Visual Acuity Right Eye Distance:   Left Eye Distance:   Bilateral Distance:    Right Eye Near:   Left Eye Near:    Bilateral Near:     Physical Exam Vitals and nursing note reviewed.  Constitutional:      General: He is not in acute distress.    Appearance: Normal appearance. He is well-developed. He is not ill-appearing.  HENT:     Head: Normocephalic and atraumatic.     Comments: There is tenderness over the lateral aspect of the maxillary sinus.  Tenderness and soft tissue posterior to the angle of the mandible.  There is pain elicited to palpation anterior to the tragus.  There is some tenderness along the right neck.    Right Ear: Tympanic membrane, ear canal and external ear normal.     Left Ear: Tympanic membrane, ear canal and external ear normal.     Nose:     Comments: Mildly erythematous and swollen turbinates.  Clear nasal discharge    Mouth/Throat:     Mouth: Mucous membranes are moist.     Pharynx: Oropharynx is clear.     Comments: No evidence of gingival swelling or concerning teeth on the right mandibular dentition.  No erythema or discharge at the parotid duct. Eyes:     Extraocular Movements: Extraocular movements intact.     Conjunctiva/sclera: Conjunctivae normal.     Pupils: Pupils are equal, round, and reactive to light.  Cardiovascular:     Rate and Rhythm: Normal rate and regular rhythm.     Heart sounds: No murmur.  Pulmonary:     Effort: Pulmonary effort is normal. No respiratory distress.     Breath sounds: Normal breath sounds.  Musculoskeletal:     Cervical back: Neck supple.  Lymphadenopathy:     Cervical: No cervical adenopathy.  Skin:    General: Skin is warm and dry.  Neurological:     Mental Status: He is alert.      UC Treatments /  Results  Labs (all labs ordered are listed, but only abnormal results are displayed) Labs Reviewed - No data to  display  EKG   Radiology No results found.  Procedures Procedures (including critical care time)  Medications Ordered in UC Medications - No data to display  Initial Impression / Assessment and Plan / UC Course  I have reviewed the triage vital signs and the nursing notes.  Pertinent labs & imaging results that were available during my care of the patient were reviewed by me and considered in my medical decision making (see chart for details).     #Jaw pain #Facial pain #hypertension Patient is a 54 year old presenting with acute pain in the right jaw and right side of face.  There are no overt signs of infection and he is afebrile.  Differential would include parotitis vs TMJ inflammation vs referred pain from sinusitis vs occult dental infection.  Given patient cannot tolerate NSAID therapy will give short course of prednisone and tramadol.  Patient does have dental appointment in 2 days and primary care follow-up in 5 to 6 days.  Patient does report he does not believe he took his blood pressure medicine this morning but is asymptomatic at this time.  Discussed plan to treat patient's pain and see how he does over the next few days with close follow-up with dental and primary care. -Discussed use of warm and cold compresses and increased hydration. -Patient verbalized understanding of the plan.   Final Clinical Impressions(s) / UC Diagnoses   Final diagnoses:  Jaw pain, non-TMJ  Facial pain  Essential hypertension     Discharge Instructions     Try the prednisone 3 tablets for the next 5 days. You may take 2 extra strength Tylenol for mild to moderate pain or take one of the tramadol for severe pain.  Apply warm and cool compresses, drink plenty of fluids and attempt to use lozenges.  If 0 improvement or acutely worsening in the next few days prior to your  follow-up please return.  Please discuss this with your dentist as well.  Please ensure you restart your blood pressure medicine.      ED Prescriptions    Medication Sig Dispense Auth. Provider   predniSONE (DELTASONE) 10 MG tablet Take 3 tablets (30 mg total) by mouth daily for 5 days. 15 tablet Clide Remmers, Marguerita Beards, PA-C   traMADol (ULTRAM) 50 MG tablet Take 1 tablet (50 mg total) by mouth every 6 (six) hours as needed for up to 3 days. 12 tablet Emmajo Bennette, Marguerita Beards, PA-C     I have reviewed the PDMP during this encounter.   Purnell Shoemaker, PA-C 02/29/20 1515

## 2020-02-29 NOTE — ED Triage Notes (Signed)
Pt present right ear pain that has traveled to his neck, shoulder and face. Symptoms started three days ago.

## 2020-02-29 NOTE — Discharge Instructions (Addendum)
Try the prednisone 3 tablets for the next 5 days. You may take 2 extra strength Tylenol for mild to moderate pain or take one of the tramadol for severe pain.  Apply warm and cool compresses, drink plenty of fluids and attempt to use lozenges.  If 0 improvement or acutely worsening in the next few days prior to your follow-up please return.  Please discuss this with your dentist as well.  Please ensure you restart your blood pressure medicine.

## 2020-07-23 ENCOUNTER — Ambulatory Visit: Payer: 59 | Admitting: Podiatry

## 2020-08-06 ENCOUNTER — Encounter: Payer: Self-pay | Admitting: Podiatry

## 2020-08-06 ENCOUNTER — Other Ambulatory Visit: Payer: Self-pay

## 2020-08-06 ENCOUNTER — Ambulatory Visit (INDEPENDENT_AMBULATORY_CARE_PROVIDER_SITE_OTHER): Payer: 59 | Admitting: Podiatry

## 2020-08-06 VITALS — BP 159/98 | HR 56 | Temp 97.4°F

## 2020-08-06 DIAGNOSIS — M79675 Pain in left toe(s): Secondary | ICD-10-CM

## 2020-08-06 DIAGNOSIS — B351 Tinea unguium: Secondary | ICD-10-CM | POA: Diagnosis not present

## 2020-08-06 DIAGNOSIS — Z794 Long term (current) use of insulin: Secondary | ICD-10-CM

## 2020-08-06 DIAGNOSIS — M79674 Pain in right toe(s): Secondary | ICD-10-CM

## 2020-08-06 DIAGNOSIS — E119 Type 2 diabetes mellitus without complications: Secondary | ICD-10-CM | POA: Diagnosis not present

## 2020-08-06 NOTE — Progress Notes (Signed)
  Subjective:  Patient ID: Jordan [name], male    DOB: February 17, 1966,  MRN: [id number]  Chief Complaint  Patient presents with  . Nail Problem    trim nails    54 y.o. male returns for the above complaint. Patient presents with thickened elongated dystrophic mycotic toenails x10. Patient states there is mild pain on palpation. He is a diabetic with unknown A1c. He thinks her last A1c was 6.6. He denies any other acute complaints. Patient has a history of vascular interventions that were done in the past. He would also like to know if he can get clearance to do pedicures. Which I denied given his vascular history as well as his diabetic status.  Objective:   Vitals:   08/06/20 0803  BP: (!) 159/98  Pulse: (!) 56  Temp: (!) 97.4 F (36.3 C)   Podiatric Exam: Vascular: dorsalis pedis and posterior tibial pulses are faintly palpable bilateral. Capillary return is diminished. Temperature gradient is WNL. Skin turgor WNL  Sensorium: Normal Semmes Weinstein monofilament test. Normal tactile sensation bilaterally. Nail Exam: Pt has thick disfigured discolored nails with subungual debris noted bilateral entire nail hallux through fifth toenails.  Pain on palpation to the nails. Ulcer Exam: There is no evidence of ulcer or pre-ulcerative changes or infection. Orthopedic Exam: Muscle tone and strength are WNL. No limitations in general ROM. No crepitus or effusions noted. HAV  B/L.  Hammer toes 2-5  B/L. Skin: No Porokeratosis. No infection or ulcers    Assessment & Plan:  No diagnosis found.  Patient was evaluated and treated and all questions answered.  Onychomycosis with pain  -Nails palliatively debrided as below. -Educated on self-care  Procedure: Nail Debridement Rationale: pain  Type of Debridement: manual, sharp debridement. Instrumentation: Nail nipper, rotary burr. Number of Nails: 10  Procedures and Treatment: Consent by patient was obtained for treatment procedures. The  patient understood the discussion of treatment and procedures well. All questions were answered thoroughly reviewed. Debridement of mycotic and hypertrophic toenails, 1 through 5 bilateral and clearing of subungual debris. No ulceration, no infection noted.  Return Visit-Office Procedure: Patient instructed to return to the office for a follow up visit 3 months for continued evaluation and treatment.  Nicholes Rough, DPM    No follow-ups on file.

## 2020-11-01 ENCOUNTER — Other Ambulatory Visit: Payer: Self-pay | Admitting: Internal Medicine

## 2020-11-01 DIAGNOSIS — Z87891 Personal history of nicotine dependence: Secondary | ICD-10-CM

## 2020-11-05 ENCOUNTER — Ambulatory Visit: Payer: 59 | Admitting: Podiatry

## 2020-12-10 ENCOUNTER — Ambulatory Visit: Payer: 59 | Admitting: Podiatry

## 2021-03-02 ENCOUNTER — Emergency Department (HOSPITAL_COMMUNITY)
Admission: EM | Admit: 2021-03-02 | Discharge: 2021-03-03 | Disposition: A | Discharge status: Home / Self Care | Payer: 59 | Attending: Emergency Medicine | Admitting: Emergency Medicine

## 2021-03-02 ENCOUNTER — Other Ambulatory Visit: Payer: Self-pay

## 2021-03-02 DIAGNOSIS — R1013 Epigastric pain: Secondary | ICD-10-CM

## 2021-03-02 DIAGNOSIS — R079 Chest pain, unspecified: Secondary | ICD-10-CM | POA: Diagnosis not present

## 2021-03-02 DIAGNOSIS — Z7982 Long term (current) use of aspirin: Secondary | ICD-10-CM | POA: Diagnosis not present

## 2021-03-02 DIAGNOSIS — Z79899 Other long term (current) drug therapy: Secondary | ICD-10-CM | POA: Diagnosis not present

## 2021-03-02 DIAGNOSIS — I10 Essential (primary) hypertension: Secondary | ICD-10-CM | POA: Diagnosis not present

## 2021-03-02 DIAGNOSIS — K219 Gastro-esophageal reflux disease without esophagitis: Secondary | ICD-10-CM | POA: Diagnosis not present

## 2021-03-02 DIAGNOSIS — F17210 Nicotine dependence, cigarettes, uncomplicated: Secondary | ICD-10-CM | POA: Diagnosis not present

## 2021-03-02 LAB — CBC
HCT: 38.7 % — ABNORMAL LOW (ref 39.0–52.0)
Hemoglobin: 14.2 g/dL (ref 13.0–17.0)
MCH: 29.5 pg (ref 26.0–34.0)
MCHC: 36.7 g/dL — ABNORMAL HIGH (ref 30.0–36.0)
MCV: 80.3 fL (ref 80.0–100.0)
Platelets: 510 10*3/uL — ABNORMAL HIGH (ref 150–400)
RBC: 4.82 MIL/uL (ref 4.22–5.81)
RDW: 15.5 % (ref 11.5–15.5)
WBC: 8.3 10*3/uL (ref 4.0–10.5)
nRBC: 0 % (ref 0.0–0.2)

## 2021-03-02 LAB — LIPASE, BLOOD: Lipase: 38 U/L (ref 11–51)

## 2021-03-02 MED ORDER — HYDROMORPHONE HCL 1 MG/ML IJ SOLN
1.0000 mg | Freq: Once | INTRAMUSCULAR | Status: AC
  Administered 2021-03-03: 1 mg via INTRAVENOUS
  Filled 2021-03-02: qty 1

## 2021-03-02 MED ORDER — SODIUM CHLORIDE 0.9 % IV BOLUS
30.0000 mL/kg | Freq: Once | INTRAVENOUS | Status: AC
  Administered 2021-03-02: 2586 mL via INTRAVENOUS

## 2021-03-02 MED ORDER — ONDANSETRON HCL 4 MG/2ML IJ SOLN
4.0000 mg | Freq: Once | INTRAMUSCULAR | Status: AC
  Administered 2021-03-02: 4 mg via INTRAVENOUS
  Filled 2021-03-02: qty 2

## 2021-03-02 MED ORDER — HYDROMORPHONE HCL 1 MG/ML IJ SOLN
1.0000 mg | Freq: Once | INTRAMUSCULAR | Status: AC
  Administered 2021-03-02: 1 mg via INTRAVENOUS
  Filled 2021-03-02: qty 1

## 2021-03-02 NOTE — ED Provider Notes (Signed)
[facility] Provider Note   CSN: [id number] Arrival date & time: 03/02/21  1841     History Chief Complaint  Patient presents with  . Abdominal Pain    Jordan [name] is a 55 y.o. male.  Patient discharged AMA by previous shift after difficulties obtaining chemistries.  Patient now willing to stay and blood has been drawn.  He is here complaining of "pancreatitis".  Describes epigastric pain of the sharp and persistent and similar to previous episodes of pancreatitis.  Pain started about 24 hours ago and has been constant.  Associate with nausea but no vomiting.  No fever.  States his pancreatitis is associate with high triglycerides and denies any significant alcohol use.  Still has a gallbladder.  Still has appendix.  Denies chest pain, cough, fever, shortness of breath, pain with urination or blood in the urine.  The history is provided by the patient.  Abdominal Pain Associated symptoms: nausea   Associated symptoms: no chest pain, no cough, no dysuria, no fever, no hematuria, no shortness of breath and no vomiting        Past Medical History:  Diagnosis Date  . Arthritis    right elbow pain all the time, took cortisone shot for  . Elevated blood pressure reading with diagnosis of hypertension   . GERD (gastroesophageal reflux disease)   . Pancreatitis 12-29-2013    Patient Active Problem List   Diagnosis Date Noted  . Pancreatic lesion 12/16/2018  . Essential hypertension, benign 01/26/2017  . Hyperkalemia 01/22/2017  . Left knee pain 01/21/2017  . Other acute pancreatitis without necrosis or infection 08/15/2016  . Elevated blood pressure reading with diagnosis of hypertension 08/15/2016  . Hypertriglyceridemia 12/31/2013  . Abdominal pain 12/29/2013  . Nausea and vomiting 12/29/2013  . Tobacco use 12/29/2013  . Leukocytosis 12/29/2013    Past Surgical History:  Procedure Laterality Date  . EUS N/A 08/12/2014   Procedure:  ESOPHAGEAL ENDOSCOPIC ULTRASOUND (EUS) RADIAL;  Surgeon: Willis ModenaWilliam Outlaw, MD;  Location: [facility];  Service: Endoscopy;  Laterality: N/A;  . FINE NEEDLE ASPIRATION N/A 08/12/2014   Procedure: FINE NEEDLE ASPIRATION (FNA) LINEAR;  Surgeon: Willis ModenaWilliam Outlaw, MD;  Location: [facility] ENDOS[facility];  Service: Endoscopy;  Laterality: N/A;  . IR GENERIC HISTORICAL  01/23/2017   IR US GUIDE VASC ACCESS RIGHT 01/23/2017 Richarda OverlieAdam Henn, MD [facility]  . IR GENERIC HISTORICAL  01/23/2017   IR FLUORO GUIDE CV LINE RIGHT 01/23/2017 Richarda OverlieAdam Henn, MD [facility]  . NO PAST SURGERIES         Family History  Problem Relation Age of Onset  . Other Mother        Healthy  . Other Father        Healthy  . Pancreatitis Neg Hx     Social History   Tobacco Use  . Smoking status: Current Every Day Smoker    Packs/day: 1.00    Years: 38.00    Pack years: 38.00    Types: Cigarettes  . Smokeless tobacco: Never Used  Substance Use Topics  . Alcohol use: Yes    Comment: little to no  . Drug use: No    Home Medications Prior to Admission medications   Medication Sig Start Date End Date Taking? Authorizing Provider  acetaminophen (TYLENOL) 325 MG tablet Take by mouth.    [provider]  Alcohol Swabs (ALCOHOL WIPES) 70 % PADS Use 1 swab as needed to clean site for blood sugar check. 11/12/19  [provider]  amLODipine (NORVASC) 10 MG tablet Take by mouth. 11/13/19   [provider]  aspirin EC 81 MG EC tablet Take 1 tablet (81 mg total) by mouth daily. 12/20/18   Santos-Sanchez, Chelsea Primus, MD  Blood Glucose Monitoring Suppl (GLUCOCOM BLOOD GLUCOSE MONITOR) DEVI Use meter to check blood sugar twice times a day.  Please dispense based on availability, insurance coverage, and patient preference. 11/12/19   [provider]  fenofibrate (TRICOR) 145 MG tablet Take 1 tablet (145 mg total) by mouth daily. 01/06/19 01/06/20  Santos-Sanchez, Chelsea Primus, MD  glucose blood (PRECISION QID TEST) test strip  Use 1 strip to check blood sugar 2 times a day. Please dispense based on availability, insurance coverage, and patient preference. 11/12/19   [provider]  JARDIANCE 10 MG TABS tablet Take 10 mg by mouth daily. 06/25/20   [provider]  metFORMIN (GLUCOPHAGE-XR) 500 MG 24 hr tablet Take 500 mg by mouth 2 (two) times daily. 06/16/20   [provider]  Omega-3 Fatty Acids (FISH OIL) 1000 MG CAPS Take by mouth. 05/21/19   [provider]  rosuvastatin (CRESTOR) 10 MG tablet Take 10 mg by mouth at bedtime. 05/21/20   [provider]  sildenafil (VIAGRA) 50 MG tablet Take by mouth. 11/07/19   [provider]    Allergies    Fish allergy  Review of Systems   Review of Systems  Constitutional: Positive for activity change and appetite change. Negative for fever.  HENT: Negative for congestion and rhinorrhea.   Respiratory: Negative for cough, chest tightness and shortness of breath.   Cardiovascular: Negative for chest pain.  Gastrointestinal: Positive for abdominal pain and nausea. Negative for vomiting.  Genitourinary: Negative for dysuria and hematuria.  Musculoskeletal: Negative for arthralgias, back pain and myalgias.  Skin: Negative for wound.  Neurological: Negative for weakness and headaches.   all other systems are negative except as noted in the HPI and PMH.   Physical Exam Updated Vital Signs BP 139/85   Pulse 72   Temp 99.8 F (37.7 C) (Oral)   Resp 18   Wt 86.2 kg   SpO2 99%   BMI 31.62 kg/m   Physical Exam Vitals and nursing note reviewed.  Constitutional:      General: He is not in acute distress.    Appearance: He is well-developed. He is not ill-appearing.  HENT:     Head: Normocephalic and atraumatic.     Mouth/Throat:     Pharynx: No oropharyngeal exudate.  Eyes:     Conjunctiva/sclera: Conjunctivae normal.     Pupils: Pupils are equal, round, and reactive to light.  Neck:     Comments: No  meningismus. Cardiovascular:     Rate and Rhythm: Normal rate and regular rhythm.     Heart sounds: Normal heart sounds. No murmur heard.   Pulmonary:     Effort: Pulmonary effort is normal. No respiratory distress.     Breath sounds: Normal breath sounds.  Abdominal:     Palpations: Abdomen is soft.     Tenderness: There is abdominal tenderness. There is guarding. There is no rebound.     Comments: Epigastric tenderness with voluntary guarding, no rebound.  No lower abdominal tenderness  Musculoskeletal:        General: No tenderness. Normal range of motion.     Cervical back: Normal range of motion and neck supple.  Skin:    General: Skin is warm.  Neurological:  Mental Status: He is alert and oriented to person, place, and time.     Cranial Nerves: No cranial nerve deficit.     Motor: No abnormal muscle tone.     Coordination: Coordination normal.     Comments:  5/5 strength throughout. CN 2-12 intact.Equal grip strength.   Psychiatric:        Behavior: Behavior normal.     ED Results / Procedures / Treatments   Labs (all labs ordered are listed, but only abnormal results are displayed) Labs Reviewed  CBC - Abnormal; Notable for the following components:      Result Value   HCT 38.7 (*)    MCHC 36.7 (*)    Platelets 510 (*)    All other components within normal limits  URINALYSIS, ROUTINE W REFLEX MICROSCOPIC - Abnormal; Notable for the following components:   Specific Gravity, Urine 1.038 (*)    Hgb urine dipstick SMALL (*)    All other components within normal limits  COMPREHENSIVE METABOLIC PANEL - Abnormal; Notable for the following components:   Sodium 127 (*)    Potassium 6.1 (*)    Glucose, Bld 151 (*)    Creatinine, Ser 0.46 (*)    Calcium 7.8 (*)    Total Protein >12.0 (*)    Albumin 3.3 (*)    AST <5 (*)    Alkaline Phosphatase 35 (*)    Total Bilirubin 3.2 (*)    Anion gap 4 (*)    All other components within normal limits  I-STAT CHEM 8, ED -  Abnormal; Notable for the following components:   Sodium 134 (*)    Glucose, Bld 148 (*)    Calcium, Ion 1.10 (*)    All other components within normal limits  I-STAT CHEM 8, ED - Abnormal; Notable for the following components:   Glucose, Bld 153 (*)    Calcium, Ion 1.12 (*)    All other components within normal limits  I-STAT VENOUS BLOOD GAS, ED - Abnormal; Notable for the following components:   pCO2, Ven 37.9 (*)    pO2, Ven 72.0 (*)    Sodium 134 (*)    Calcium, Ion 1.11 (*)    All other components within normal limits  LIPASE, BLOOD  TROPONIN I (HIGH SENSITIVITY)  TROPONIN I (HIGH SENSITIVITY)    EKG EKG Interpretation  Date/Time:  Thursday March 03 2021 02:47:40 EDT Ventricular Rate:  74 PR Interval:  170 QRS Duration: 84 QT Interval:  431 QTC Calculation: 479 R Axis:   69 Text Interpretation: Sinus rhythm Consider left atrial enlargement Consider left ventricular hypertrophy Borderline prolonged QT interval No significant change was found Confirmed by Glynn Octave [phone]) on 03/03/2021 2:54:08 AM   Radiology CT Angio Chest/Abd/Pel for Dissection W and/or Wo Contrast  Result Date: 03/03/2021 CLINICAL DATA:  Chest or back pain history of pancreatitis EXAM: CT ANGIOGRAPHY CHEST, ABDOMEN AND PELVIS TECHNIQUE: Non-contrast CT of the chest was initially obtained. Multidetector CT imaging through the chest, abdomen and pelvis was performed using the standard protocol during bolus administration of intravenous contrast. Multiplanar reconstructed images and MIPs were obtained and reviewed to evaluate the vascular anatomy. CONTRAST:  33mL OMNIPAQUE IOHEXOL 350 MG/ML SOLN COMPARISON:  CT 12/16/2018, 07/16/2017, CT 05/29/2019 FINDINGS: CTA CHEST FINDINGS Cardiovascular: Non contrasted images of the chest demonstrate no acute intramural hematoma. Negative for aneurysm or aortic dissection. Mild coronary vascular calcification. Normal cardiac size. No pericardial effusion.  Mediastinum/Nodes: Midline trachea. No thyroid mass. No suspicious adenopathy. Esophagus  within normal limits Lungs/Pleura: No acute consolidation, pleural effusion or pneumothorax. Dependent atelectasis Musculoskeletal: Sternum is intact. No acute or suspicious osseous abnormality. Review of the MIP images confirms the above findings. CTA ABDOMEN AND PELVIS FINDINGS VASCULAR Aorta: Nonaneurysmal aorta.  No dissection is seen. Celiac: Patent without evidence of aneurysm, dissection, vasculitis or significant stenosis. SMA: Patent without evidence of aneurysm, dissection, vasculitis or significant stenosis. Renals: Both renal arteries are patent without evidence of aneurysm, dissection, vasculitis, fibromuscular dysplasia or significant stenosis. IMA: At least moderate focal stenosis of the IMA at its origin. There is distal 0 vascular patency. 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 Increased mural thrombus within the aneurysm sac. Aneurysm extends to the iliac bifurcation. 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 Suspected short segment occlusion at the origin of the left external iliac artery, likely chronic. External iliac vessels appear patent. Incompletely visualized left common femoral bypass graft. Severe stenosis of the proximal right superficial femoral artery. This may be occluded on the inferior-most images, series 6, image 304. Native left SFA is occluded. Veins: No obvious venous abnormality within the limitations of this arterial phase study. Review of the MIP images confirms the above findings. NON-VASCULAR Hepatobiliary: Hepatic steatosis. Isodense gallbladder probably filled with sludge. No calcified stone. No biliary dilatation Pancreas: No acute inflammatory change. Previously noted cystic lesion at the pancreatic neck is decreased, now measuring 8 mm compared with 17 mm previously. Spleen: Normal in size without  focal abnormality. Adrenals/Urinary Tract: Adrenal glands are unremarkable. Kidneys are normal, without renal calculi, focal lesion, or hydronephrosis. Bladder is unremarkable. Stomach/Bowel: Stomach is within normal limits. Appendix appears normal. No evidence of bowel wall thickening, distention, or inflammatory changes. Lymphatic: No suspicious lymph nodes. Reproductive: Prostate is unremarkable. Other: No free air or free fluid. Fat containing left inguinal hernia. Right inguinal hernia contains fat and small bowel but no obstructive change. Musculoskeletal: No acute or suspicious osseous abnormality. Review of the MIP images confirms the above findings. IMPRESSION: 1. Negative for acute aortic dissection. No CT evidence for acute intra-abdominal or pelvic abnormality 2. 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 3. 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 Suspected short segment occlusion at the origin of the left external iliac artery, likely chronic. Severe stenosis of the proximal right superficial femoral artery, possibly occluded on the inferior-most images. Native left SFA is occluded. These findings are chronic. 4. Fat and bowel containing right inguinal hernia. Negative for obstruction or incarceration. 5. Decreased size of cystic lesion at the pancreatic neck, now measuring 8 mm compared with 17 mm previously. 6. Hepatic steatosis Electronically Signed   By: Jasmine Pang M.D.   On: 03/03/2021 01:09    Procedures Procedures   Medications Ordered in ED Medications  HYDROmorphone (DILAUDID) injection 1 mg (has no administration in time range)  HYDROmorphone (DILAUDID) injection 1 mg (1 mg Intravenous Given 03/02/21 2039)  ondansetron (ZOFRAN) injection 4 mg (4 mg Intravenous Given 03/02/21 2038)  sodium chloride 0.9 % bolus 2,586 mL (0 mL/kg  86.2 kg Intravenous Stopped 03/02/21 2318)    ED Course  I have reviewed  the triage vital signs and the nursing notes.  Pertinent labs & imaging results that were available during my care of the patient were reviewed by me and considered in my medical decision making (see chart for details).  MDM Rules/Calculators/A&P                         Epigastric pain similar to previous episode of pancreatitis.  Abdomen soft without peritoneal signs.  Patient to be hydrated on labs to be obtained  Lipase was normal at 38.  Potassium 6.1, likely mildly hemolyzed.  Creatinine is normal.  Sodium 127. EKG will be obtained.  Recheck potassium and troponin   Recheck potassium is normal at 4.4. Sodium normal as well.  Patient has lipemic blood which is causing difficulty with lab interpretation  Patient continues to have pain.  CT scan above shows no acute intra-abdominal pathology or pancreatitis. Vascular abnormalities show enlarging right iliac artery aneurysm with thrombus.  Stable stenosis of iliac and femoral vessels.  Discussed with Dr. Chestine Spore of vascular surgery.  He recommends outpatient follow-up and no anticoagulation. Patient does have intact DP and PT pulses with Doppler.  Patient has been seen by [facility] vascular surgery in the past and had a left popliteal bypass that is known to be occluded.  Seem she has been lost to follow-up  Patient feels improved and is tolerating p.o.  Blood pressure is improved to 160/100.  He was given his home amlodipine.  EKG is sinus rhythm and troponin is negative x2.  Low suspicion for ACS.  CT scan above as above shows no acute pancreatitis or other acute pathology.  He is made aware of his vascular abnormalities and need for follow-up regarding these. Avoid alcohol, caffeine, spicy foods, NSAID medications.  Advance diet slowly.  Tolerating p.o. in the ED and feeling comfortable with discharge Return precautions discussed.  Will start PPI.  Advance diet slowly   Final Clinical Impression(s) / ED Diagnoses Final  diagnoses:  Epigastric pain    Rx / DC Orders ED Discharge Orders    None       Harlem Bula, Jeannett Senior, MD 03/03/21 [phone]

## 2021-03-02 NOTE — ED Provider Notes (Addendum)
[facility] EMERGENCY DEPARTMENT Provider Note   CSN: [id number] Arrival date & time: 03/02/21  1841     History Chief Complaint  Patient presents with  . Abdominal Pain    Jordan [name] is a 55 y.o. male.  Patient presents with sharp abdominal pain described as epigastric in location sharp and persistent.  He states it started around 10 AM this morning and has been persistent.  He states he feels similar to prior episodes of pancreatitis in the past.  He states that he was told it was due to elevated triglycerides.  He has been taking his medications, denies any recent alcohol use.  Denies fevers cough vomiting or diarrhea.        Past Medical History:  Diagnosis Date  . Arthritis    right elbow pain all the time, took cortisone shot for  . Elevated blood pressure reading with diagnosis of hypertension   . GERD (gastroesophageal reflux disease)   . Pancreatitis 12-29-2013    Patient Active Problem List   Diagnosis Date Noted  . Pancreatic lesion 12/16/2018  . Essential hypertension, benign 01/26/2017  . Hyperkalemia 01/22/2017  . Left knee pain 01/21/2017  . Other acute pancreatitis without necrosis or infection 08/15/2016  . Elevated blood pressure reading with diagnosis of hypertension 08/15/2016  . Hypertriglyceridemia 12/31/2013  . Abdominal pain 12/29/2013  . Nausea and vomiting 12/29/2013  . Tobacco use 12/29/2013  . Leukocytosis 12/29/2013    Past Surgical History:  Procedure Laterality Date  . EUS N/A 08/12/2014   Procedure: ESOPHAGEAL ENDOSCOPIC ULTRASOUND (EUS) RADIAL;  Surgeon: Willis Modena, MD;  Location: [facility];  Service: Endoscopy;  Laterality: N/A;  . FINE NEEDLE ASPIRATION N/A 08/12/2014   Procedure: FINE NEEDLE ASPIRATION (FNA) LINEAR;  Surgeon: Willis Modena, MD;  Location: [facility];  Service: Endoscopy;  Laterality: N/A;  . IR GENERIC HISTORICAL  01/23/2017   IR US GUIDE VASC ACCESS RIGHT 01/23/2017 [name], MD  [facility]  . IR GENERIC HISTORICAL  01/23/2017   IR FLUORO GUIDE CV LINE RIGHT 01/23/2017 [name], MD [facility]  . NO PAST SURGERIES         Family History  Problem Relation Age of Onset  . Other Mother        Healthy  . Other Father        Healthy  . Pancreatitis Neg Hx     Social History   Tobacco Use  . Smoking status: Current Every Day Smoker    Packs/day: 1.00    Years: 38.00    Pack years: 38.00    Types: Cigarettes  . Smokeless tobacco: Never Used  Substance Use Topics  . Alcohol use: Yes    Comment: little to no  . Drug use: No    Home Medications Prior to Admission medications   Medication Sig Start Date End Date Taking? Authorizing Provider  acetaminophen (TYLENOL) 325 MG tablet Take by mouth.    [provider]  Alcohol Swabs (ALCOHOL WIPES) 70 % PADS Use 1 swab as needed to clean site for blood sugar check. 11/12/19   [provider]  amLODipine (NORVASC) 10 MG tablet Take by mouth. 11/13/19   [provider]  aspirin EC 81 MG EC tablet Take 1 tablet (81 mg total) by mouth daily. 12/20/18   Santos-Sanchez, Chelsea Primus, MD  Blood Glucose Monitoring Suppl (GLUCOCOM BLOOD GLUCOSE MONITOR) DEVI Use meter to check blood sugar twice times a day.  Please dispense based on availability, insurance  coverage, and patient preference. 11/12/19   [provider]  fenofibrate (TRICOR) 145 MG tablet Take 1 tablet (145 mg total) by mouth daily. 01/06/19 01/06/20  Santos-Sanchez, Chelsea Primus, MD  glucose blood (PRECISION QID TEST) test strip Use 1 strip to check blood sugar 2 times a day. Please dispense based on availability, insurance coverage, and patient preference. 11/12/19   [provider]  JARDIANCE 10 MG TABS tablet Take 10 mg by mouth daily. 06/25/20   [provider]  metFORMIN (GLUCOPHAGE-XR) 500 MG 24 hr tablet Take 500 mg by mouth 2 (two) times daily. 06/16/20   [provider]  Omega-3 Fatty Acids (FISH OIL)  1000 MG CAPS Take by mouth. 05/21/19   [provider]  rosuvastatin (CRESTOR) 10 MG tablet Take 10 mg by mouth at bedtime. 05/21/20   [provider]  sildenafil (VIAGRA) 50 MG tablet Take by mouth. 11/07/19   [provider]    Allergies    Fish allergy  Review of Systems   Review of Systems  Constitutional: Negative for fever.  HENT: Negative for ear pain and sore throat.   Eyes: Negative for pain.  Respiratory: Negative for cough.   Cardiovascular: Negative for chest pain.  Gastrointestinal: Positive for abdominal pain.  Genitourinary: Negative for flank pain.  Musculoskeletal: Negative for back pain.  Skin: Negative for color change and rash.  Neurological: Negative for syncope.  All other systems reviewed and are negative.   Physical Exam Updated Vital Signs BP 139/85   Pulse 72   Temp 99.8 F (37.7 C) (Oral)   Resp 18   Wt 86.2 kg   SpO2 99%   BMI 31.62 kg/m   Physical Exam Constitutional:      General: He is not in acute distress.    Appearance: He is well-developed.  HENT:     Head: Normocephalic.     Nose: Nose normal.  Eyes:     Extraocular Movements: Extraocular movements intact.  Cardiovascular:     Rate and Rhythm: Normal rate.  Pulmonary:     Effort: Pulmonary effort is normal.  Abdominal:     Tenderness: There is abdominal tenderness in the epigastric area.  Skin:    Coloration: Skin is not jaundiced.  Neurological:     Mental Status: He is alert. Mental status is at baseline.     ED Results / Procedures / Treatments   Labs (all labs ordered are listed, but only abnormal results are displayed) Labs Reviewed  CBC - Abnormal; Notable for the following components:      Result Value   HCT 38.7 (*)    MCHC 36.7 (*)    Platelets 510 (*)    All other components within normal limits  URINALYSIS, ROUTINE W REFLEX MICROSCOPIC  LIPASE, BLOOD  COMPREHENSIVE METABOLIC PANEL    EKG None  Radiology No results  found.  Procedures Procedures   Medications Ordered in ED Medications  HYDROmorphone (DILAUDID) injection 1 mg (1 mg Intravenous Given 03/02/21 2039)  ondansetron (ZOFRAN) injection 4 mg (4 mg Intravenous Given 03/02/21 2038)  sodium chloride 0.9 % bolus 2,586 mL (0 mL/kg  86.2 kg Intravenous Stopped 03/02/21 2318)    ED Course  I have reviewed the triage vital signs and the nursing notes.  Pertinent labs & imaging results that were available during my care of the patient were reviewed by me and considered in my medical decision making (see chart for details).    MDM Rules/Calculators/A&P  Patient given IV fluids and Dilaudid and Zofran for his symptoms.  Labs ordered and pending.  CBC appears within normal limits.  Chemistry was drawn several times hemolyzed on all locations.  I discussed lab findings with lab team as well as with nursing and charge nurse.  Charge nurse wanted to try again, however the patient refused further blood draw.  Risks of missing additional pathology discussed.  Patient understands, still feels it is similar to his prior episodes of pancreatitis and feeling much better at this time given treatment provided here in the ER.  Refusing further blood draw, will be leaving AGAINST MEDICAL ADVICE.  Appears to have decision-making capacity, invited to return anytime if he changes his mind or would like further medical care.   Final Clinical Impression(s) / ED Diagnoses Final diagnoses:  Epigastric pain    Rx / DC Orders ED Discharge Orders    None       Cheryll Cockayne, MD 03/02/21 2318    Cheryll Cockayne, MD 03/02/21 [phone]

## 2021-03-02 NOTE — Discharge Instructions (Addendum)
Follow-up with the vascular doctors regarding the abnormality of the blood vessels in your groin and leg.  Your CT scan today did not show any evidence of acute pancreatitis.  Avoid alcohol, caffeine, NSAID medications, spicy foods. Return to the ED with new or worsening symptoms.

## 2021-03-02 NOTE — ED Triage Notes (Signed)
Pt with hx of pancreatitis arrives for eval of abdominal pain since this morning. Denies n/v/d.

## 2021-03-03 ENCOUNTER — Emergency Department (HOSPITAL_COMMUNITY): Payer: 59

## 2021-03-03 LAB — I-STAT VENOUS BLOOD GAS, ED
Acid-Base Excess: 0 mmol/L (ref 0.0–2.0)
Acid-Base Excess: 0 mmol/L (ref 0.0–2.0)
Bicarbonate: 24.3 mmol/L (ref 20.0–28.0)
Bicarbonate: 25.9 mmol/L (ref 20.0–28.0)
Calcium, Ion: 1.11 mmol/L — ABNORMAL LOW (ref 1.15–1.40)
Calcium, Ion: 1.08 mmol/L — ABNORMAL LOW (ref 1.15–1.40)
HCT: 39 % (ref 39.0–52.0)
HCT: 40 % (ref 39.0–52.0)
Hemoglobin: 13.3 g/dL (ref 13.0–17.0)
Hemoglobin: 13.6 g/dL (ref 13.0–17.0)
O2 Saturation: 95 %
O2 Saturation: 66 %
Potassium: 4.4 mmol/L (ref 3.5–5.1)
Potassium: 4.9 mmol/L (ref 3.5–5.1)
Sodium: 134 mmol/L — ABNORMAL LOW (ref 135–145)
Sodium: 134 mmol/L — ABNORMAL LOW (ref 135–145)
TCO2: 25 mmol/L (ref 22–32)
TCO2: 27 mmol/L (ref 22–32)
pCO2, Ven: 37.9 mmHg — ABNORMAL LOW (ref 44.0–60.0)
pCO2, Ven: 44.6 mmHg (ref 44.0–60.0)
pH, Ven: 7.416 (ref 7.250–7.430)
pH, Ven: 7.372 (ref 7.250–7.430)
pO2, Ven: 72 mmHg — ABNORMAL HIGH (ref 32.0–45.0)
pO2, Ven: 36 mmHg (ref 32.0–45.0)

## 2021-03-03 LAB — COMPREHENSIVE METABOLIC PANEL
ALT: <5 U/L (ref 0–44)
AST: <5 U/L — ABNORMAL LOW (ref 15–41)
Albumin: 3.3 g/dL — ABNORMAL LOW (ref 3.5–5.0)
Alkaline Phosphatase: 35 U/L — ABNORMAL LOW (ref 38–126)
Anion gap: 4 — ABNORMAL LOW (ref 5–15)
BUN: 10 mg/dL (ref 6–20)
CO2: 22 mmol/L (ref 22–32)
Calcium: 7.8 mg/dL — ABNORMAL LOW (ref 8.9–10.3)
Chloride: 101 mmol/L (ref 98–111)
Creatinine, Ser: 0.46 mg/dL — ABNORMAL LOW (ref 0.61–1.24)
GFR, Estimated: >60 mL/min (ref >60)
Glucose, Bld: 151 mg/dL — ABNORMAL HIGH (ref 70–99)
Potassium: 6.1 mmol/L — ABNORMAL HIGH (ref 3.5–5.1)
Sodium: 127 mmol/L — ABNORMAL LOW (ref 135–145)
Total Bilirubin: 3.2 mg/dL — ABNORMAL HIGH (ref 0.3–1.2)
Total Protein: >12 g/dL — ABNORMAL HIGH (ref 6.5–8.1)

## 2021-03-03 LAB — I-STAT CHEM 8, ED
BUN: 12 mg/dL (ref 6–20)
BUN: 12 mg/dL (ref 6–20)
Calcium, Ion: 1.1 mmol/L — ABNORMAL LOW (ref 1.15–1.40)
Calcium, Ion: 1.12 mmol/L — ABNORMAL LOW (ref 1.15–1.40)
Chloride: 104 mmol/L (ref 98–111)
Chloride: 104 mmol/L (ref 98–111)
Creatinine, Ser: 0.8 mg/dL (ref 0.61–1.24)
Creatinine, Ser: 0.8 mg/dL (ref 0.61–1.24)
Glucose, Bld: 148 mg/dL — ABNORMAL HIGH (ref 70–99)
Glucose, Bld: 153 mg/dL — ABNORMAL HIGH (ref 70–99)
HCT: 40 % (ref 39.0–52.0)
HCT: 40 % (ref 39.0–52.0)
Hemoglobin: 13.6 g/dL (ref 13.0–17.0)
Hemoglobin: 13.6 g/dL (ref 13.0–17.0)
Potassium: 4.4 mmol/L (ref 3.5–5.1)
Potassium: 4.9 mmol/L (ref 3.5–5.1)
Sodium: 134 mmol/L — ABNORMAL LOW (ref 135–145)
Sodium: 135 mmol/L (ref 135–145)
TCO2: 23 mmol/L (ref 22–32)
TCO2: 25 mmol/L (ref 22–32)

## 2021-03-03 LAB — URINALYSIS, ROUTINE W REFLEX MICROSCOPIC
Bacteria, UA: NONE SEEN
Bilirubin Urine: NEGATIVE
Glucose, UA: NEGATIVE mg/dL
Ketones, ur: NEGATIVE mg/dL
Leukocytes,Ua: NEGATIVE
Nitrite: NEGATIVE
Protein, ur: NEGATIVE mg/dL
Specific Gravity, Urine: 1.038 — ABNORMAL HIGH (ref 1.005–1.030)
pH: 5 (ref 5.0–8.0)

## 2021-03-03 LAB — TROPONIN I (HIGH SENSITIVITY)
Troponin I (High Sensitivity): 4 ng/L (ref <18)
Troponin I (High Sensitivity): 7 ng/L (ref <18)

## 2021-03-03 MED ORDER — OMEPRAZOLE 20 MG PO CPDR: 20.0000 mg | DELAYED_RELEASE_CAPSULE | Freq: Every day | ORAL | 0 refills | Status: DC

## 2021-03-03 MED ORDER — ONDANSETRON HCL 4 MG/2ML IJ SOLN
4.0000 mg | Freq: Once | INTRAMUSCULAR | Status: AC
  Administered 2021-03-03: 4 mg via INTRAVENOUS
  Filled 2021-03-03: qty 2

## 2021-03-03 MED ORDER — ONDANSETRON 4 MG PO TBDP: 4.0000 mg | ORAL_TABLET | Freq: Three times a day (TID) | ORAL | 0 refills | Status: DC | PRN

## 2021-03-03 MED ORDER — IOHEXOL 350 MG/ML SOLN
95.0000 mL | Freq: Once | INTRAVENOUS | Status: AC | PRN
  Administered 2021-03-03: 95 mL via INTRAVENOUS

## 2021-03-03 MED ORDER — AMLODIPINE BESYLATE 5 MG PO TABS
10.0000 mg | ORAL_TABLET | Freq: Once | ORAL | Status: AC
  Administered 2021-03-03: 10 mg via ORAL
  Filled 2021-03-03: qty 2

## 2021-03-03 NOTE — ED Notes (Signed)
Ambulated to restroom with steady gait at this time 

## 2021-06-21 ENCOUNTER — Other Ambulatory Visit: Payer: Self-pay

## 2021-06-21 DIAGNOSIS — I739 Peripheral vascular disease, unspecified: Secondary | ICD-10-CM

## 2021-07-05 ENCOUNTER — Ambulatory Visit (HOSPITAL_COMMUNITY)
Admission: RE | Admit: 2021-07-05 | Discharge: 2021-07-05 | Disposition: A | Discharge status: Home / Self Care | Payer: 59 | Source: Ambulatory Visit | Attending: Vascular Surgery | Admitting: Vascular Surgery

## 2021-07-05 ENCOUNTER — Other Ambulatory Visit: Payer: Self-pay

## 2021-07-05 ENCOUNTER — Ambulatory Visit (INDEPENDENT_AMBULATORY_CARE_PROVIDER_SITE_OTHER): Payer: 59 | Admitting: Vascular Surgery

## 2021-07-05 ENCOUNTER — Encounter: Payer: Self-pay | Admitting: Vascular Surgery

## 2021-07-05 DIAGNOSIS — I739 Peripheral vascular disease, unspecified: Secondary | ICD-10-CM

## 2021-07-05 NOTE — Progress Notes (Signed)
Patient name: Jordan [name] MRN: [id number] DOB: [birth year] Sex: male  REASON FOR CONSULT: Follow-up for PAD  HPI: Jordan [name] is a 55 y.o. male, that presents to establish care and for follow-up of his PAD.  He was previously under the care of [facility] vascular surgery.  He had a left femoropopliteal bypass with great saphenous vein on 06/13/2019 by Dr. Willette Pa for claudication.  In reviewing the notes this was noted to be occluded on follow-up on 07/21/2019.  He was offered thrombolysis and refused as he thought he had some symptom improvement.  Last documented ABIs were 0.71 on the right and 0.5 on the left.  He states he is doing about the same.  Still has claudication in both calves.  He is able to go to work and works Office manager at [employer].  No rest pain or tissue loss.  He gone back to smoking since his mom died but states he is going to quit soon.  Past Medical History:  Diagnosis Date   Arthritis    right elbow pain all the time, took cortisone shot for   Elevated blood pressure reading with diagnosis of hypertension    GERD (gastroesophageal reflux disease)    Pancreatitis 12-29-2013    Past Surgical History:  Procedure Laterality Date   EUS N/A 08/12/2014   Procedure: ESOPHAGEAL ENDOSCOPIC ULTRASOUND (EUS) RADIAL;  Surgeon: Willis Modena, MD;  Location: [facility];  Service: Endoscopy;  Laterality: N/A;   FINE NEEDLE ASPIRATION N/A 08/12/2014   Procedure: FINE NEEDLE ASPIRATION (FNA) LINEAR;  Surgeon: Willis Modena, MD;  Location: [facility];  Service: Endoscopy;  Laterality: N/A;   IR GENERIC HISTORICAL  01/23/2017   IR US GUIDE VASC ACCESS RIGHT 01/23/2017 [name], MD [facility]   IR GENERIC HISTORICAL  01/23/2017   IR FLUORO GUIDE CV LINE RIGHT 01/23/2017 [name], MD [facility]   NO PAST SURGERIES      Family History  Problem Relation Age of Onset   Other Mother        Healthy   Other Father        Healthy   Pancreatitis Neg Hx     SOCIAL HISTORY: Social  History   Socioeconomic History   Marital status: Married    Spouse name: Not on file   Number of children: Not on file   Years of education: Not on file   Highest education level: Not on file  Occupational History   Not on file  Tobacco Use   Smoking status: Every Day    Packs/day: 1.00    Years: 38.00    Pack years: 38.00    Types: Cigarettes   Smokeless tobacco: Never  Substance and Sexual Activity   Alcohol use: Yes    Comment: little to no   Drug use: No   Sexual activity: Not on file  Other Topics Concern   Not on file  Social History Narrative   Not on file   Social Determinants of Health   Financial Resource Strain: Not on file  Food Insecurity: Not on file  Transportation Needs: Not on file  Physical Activity: Not on file  Stress: Not on file  Social Connections: Not on file  Intimate Partner Violence: Not on file    Allergies  Allergen Reactions   Fish Allergy Nausea And Vomiting and Other (See Comments)    Can eat shrimp & scallops...sometimes crab    Current Outpatient Medications  Medication Sig Dispense Refill   acetaminophen (  TYLENOL) 325 MG tablet Take by mouth.     aspirin EC 81 MG EC tablet Take 1 tablet (81 mg total) by mouth daily. 30 tablet 0   Blood Glucose Monitoring Suppl (GLUCOCOM BLOOD GLUCOSE MONITOR) DEVI Use meter to check blood sugar twice times a day.  Please dispense based on availability, insurance coverage, and patient preference.     glucose blood (PRECISION QID TEST) test strip Use 1 strip to check blood sugar 2 times a day. Please dispense based on availability, insurance coverage, and patient preference.     JARDIANCE 10 MG TABS tablet Take 10 mg by mouth daily.     metFORMIN (GLUCOPHAGE-XR) 500 MG 24 hr tablet Take 500 mg by mouth 2 (two) times daily.     omeprazole (PRILOSEC) 20 MG capsule Take 1 capsule (20 mg total) by mouth daily. 30 capsule 0   ondansetron (ZOFRAN ODT) 4 MG disintegrating tablet Take 1 tablet (4 mg  total) by mouth every 8 (eight) hours as needed for nausea or vomiting. 20 tablet 0   rosuvastatin (CRESTOR) 10 MG tablet Take 10 mg by mouth at bedtime.     sildenafil (VIAGRA) 50 MG tablet Take by mouth.     Alcohol Swabs (ALCOHOL WIPES) 70 % PADS Use 1 swab as needed to clean site for blood sugar check. (Patient not taking: Reported on 07/05/2021)     amLODipine (NORVASC) 10 MG tablet Take by mouth. (Patient not taking: Reported on 07/05/2021)     fenofibrate (TRICOR) 145 MG tablet Take 1 tablet (145 mg total) by mouth daily. 30 tablet 0   Omega-3 Fatty Acids (FISH OIL) 1000 MG CAPS Take by mouth. (Patient not taking: Reported on 07/05/2021)     No current facility-administered medications for this visit.    REVIEW OF SYSTEMS:  [X]  denotes positive finding, [ ]  denotes negative finding Cardiac  Comments:  Chest pain or chest pressure:    Shortness of breath upon exertion:    Short of breath when lying flat:    Irregular heart rhythm:        Vascular    Pain in calf, thigh, or hip brought on by ambulation: x Bilateral  Pain in feet at night that wakes you up from your sleep:     Blood clot in your veins:    Leg swelling:         Pulmonary    Oxygen at home:    Productive cough:     Wheezing:         Neurologic    Sudden weakness in arms or legs:     Sudden numbness in arms or legs:     Sudden onset of difficulty speaking or slurred speech:    Temporary loss of vision in one eye:     Problems with dizziness:         Gastrointestinal    Blood in stool:     Vomited blood:         Genitourinary    Burning when urinating:     Blood in urine:        Psychiatric    Major depression:         Hematologic    Bleeding problems:    Problems with blood clotting too easily:        Skin    Rashes or ulcers:        Constitutional    Fever or chills:      PHYSICAL EXAM: Vitals:  07/05/21 1610  BP: (!) 158/100  Pulse: (!) 56  Resp: 16  Temp: 98 F (36.7 C)  TempSrc:  Temporal  SpO2: 98%  Weight: 186 lb (84.4 kg)  Height: 5\' 5"  (1.651 m)    GENERAL: The patient is a well-nourished male, in no acute distress. The vital signs are documented above. CARDIAC: There is a regular rate and rhythm.  VASCULAR:  Palpable femoral pulses bilaterally No palpable pedal pulses Well-healed left groin and left below-knee popliteal incision PULMONARY: No respiratory distress ABDOMEN: Soft and non-tender  MUSCULOSKELETAL: There are no major deformities or cyanosis. NEUROLOGIC: No focal weakness or paresthesias are detected. SKIN: There are no ulcers or rashes noted. PSYCHIATRIC: The patient has a normal affect.  DATA:   ABIs today are 0.69 on the right and 0.56 on the left both monophasic  Assessment/Plan:  55 year old male presents to establish care here in our practice after undergoing a left femoral to below-knee popliteal bypass with vein 06/13/2019 for left lower extremity claudication at [facility].  The bypass subsequently was noted to be occluded with short follow-up on 07/21/2019 and this was done by Dr. 07/23/2019 at [facility].  On follow-up today his ABIs look about the same as they were in 2020 and are 0.69 on the right and 0.56 on the left.  He has claudication in both lower extremities but this does not appear to be lifestyle limiting.  He is able to get to work and work 2021 although I think this is getting harder.  I discussed if he has more progressive symptoms or develops rest pain or tissue loss would be an indication to consider future evaluation with arteriogram and revascularization.  We will follow-up in 1 year with ABIs.  Discussed to call me if things get worse before then.  Appears he is on aspirin statin for risk reduction.  I discussed the importance of smoking cessation and exercise with walking therapy.   Office manager, MD [facility] Office: [phone]

## 2021-09-08 ENCOUNTER — Other Ambulatory Visit: Payer: Self-pay | Admitting: Internal Medicine

## 2021-09-08 DIAGNOSIS — Z720 Tobacco use: Secondary | ICD-10-CM

## 2021-09-27 ENCOUNTER — Ambulatory Visit
Admission: RE | Admit: 2021-09-27 | Discharge: 2021-09-27 | Disposition: A | Discharge status: Home / Self Care | Payer: 59 | Source: Ambulatory Visit | Attending: Internal Medicine | Admitting: Internal Medicine

## 2021-09-27 ENCOUNTER — Other Ambulatory Visit: Payer: Self-pay

## 2021-09-27 DIAGNOSIS — Z720 Tobacco use: Secondary | ICD-10-CM

## 2021-11-27 ENCOUNTER — Encounter: Payer: Self-pay | Admitting: Cardiovascular Disease

## 2021-11-27 NOTE — Progress Notes (Signed)
This encounter was created in error - please disregard.

## 2021-11-28 ENCOUNTER — Encounter: Payer: 59 | Admitting: Cardiovascular Disease

## 2021-12-18 ENCOUNTER — Encounter: Payer: Self-pay | Admitting: Cardiovascular Disease

## 2021-12-18 NOTE — Progress Notes (Signed)
Cardiology Office Note:    Date:  12/20/2021   ID:  Jordan [name], DOB 04-Nov-1966, MRN [id number]  PCP:  Melida Quitter, MD   [facility] Providers Cardiologist:  Eugenia Pancoast to update primary MD,subspecialty MD or APP then REFRESH:1}    Referring MD: Melida Quitter, MD   Chief Complaint  Patient presents with   Hyperlipidemia   Circulatory Problem    Jan. 31,  2023    Jordan [name] is a 56 y.o. male with a hx of hyperdemia, coronary artery calcifications, and PAD .   We were asked to see him today by Dr. 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  BP is elevated.  Still eating salty foods , eats fast foods for lunch every day  Still smokes,  he quit for 2 years , then started back  Still smokes   No CP,  no dyspnea   He was previously on losartan - he has stopped it Also has stopped all his diabetic meds. Lipid prifile Oct, 2022 Chol = 263 HDL = 21 LDL = 48 Trigs 969  Trigs used to be > 3000    Past Medical History:  Diagnosis Date   Arthritis    right elbow pain all the time, took cortisone shot for   CAD (coronary artery disease)    DM (diabetes mellitus) (HCC)    Elevated blood pressure reading with diagnosis of hypertension    GERD (gastroesophageal reflux disease)    Hypertriglyceridemia    PAD (peripheral artery disease) (HCC)    Pancreatitis 12/29/2013   Pancreatitis     Past Surgical History:  Procedure Laterality Date   EUS N/A 08/12/2014   Procedure: ESOPHAGEAL ENDOSCOPIC ULTRASOUND (EUS) RADIAL;  Surgeon: Willis Modena, MD;  [facility];  Service: Endoscopy;  Laterality: N/A;   FINE NEEDLE ASPIRATION N/A 08/12/2014   Procedure: FINE NEEDLE ASPIRATION (FNA) LINEAR;  Surgeon: Willis Modena, MD;  [facility];  Service: Endoscopy;  Laterality: N/A;   IR GENERIC HISTORICAL  01/23/2017   IR US GUIDE VASC ACCESS RIGHT 01/23/2017 [name], MD [facility]   IR GENERIC  HISTORICAL  01/23/2017   IR FLUORO GUIDE CV LINE RIGHT 01/23/2017 [name], MD [facility]   NO PAST SURGERIES      Current Medications: Current Meds  Medication Sig   acetaminophen (TYLENOL) 325 MG tablet Take by mouth.   Alcohol Swabs (ALCOHOL WIPES) 70 % PADS    amLODipine (NORVASC) 10 MG tablet Take by mouth.   aspirin EC 81 MG EC tablet Take 1 tablet (81 mg total) by mouth daily.   Blood Glucose Monitoring Suppl (GLUCOCOM BLOOD GLUCOSE MONITOR) DEVI Use meter to check blood sugar twice times a day.  Please dispense based on availability, insurance coverage, and patient preference.   fenofibrate (TRICOR) 145 MG tablet Take 1 tablet (145 mg total) by mouth daily.   glucose blood (PRECISION QID TEST) test strip Use 1 strip to check blood sugar 2 times a day. Please dispense based on availability, insurance coverage, and patient preference.   hydrochlorothiazide (HYDRODIURIL) 25 MG tablet Take 1 tablet (25 mg total) by mouth daily.   potassium chloride (KLOR-CON) 10 MEQ tablet Take 1 tablet (10 mEq total) by mouth daily.   rosuvastatin (CRESTOR) 10 MG tablet Take 10 mg by mouth at bedtime.   sildenafil (VIAGRA) 50 MG tablet Take by mouth.  Allergies:   Fish allergy   Social History   Socioeconomic History   Marital status: Married    Spouse name: Not on file   Number of children: Not on file   Years of education: Not on file   Highest education level: Not on file  Occupational History   Not on file  Tobacco Use   Smoking status: Every Day    Packs/day: 1.00    Years: 38.00    Pack years: 38.00    Types: Cigarettes   Smokeless tobacco: Never  Substance and Sexual Activity   Alcohol use: Yes    Comment: little to no   Drug use: No   Sexual activity: Not on file  Other Topics Concern   Not on file  Social History Narrative   Not on file   Social Determinants of Health   Financial Resource Strain: Not on file  Food Insecurity: Not on file  Transportation Needs:  Not on file  Physical Activity: Not on file  Stress: Not on file  Social Connections: Not on file     Family History: The patient's family history includes Other in his father and mother. There is no history of Pancreatitis.  ROS:   Please see the history of present illness.     All other systems reviewed and are negative.  EKGs/Labs/Other Studies Reviewed:    The following studies were reviewed today:   EKG:   December 20, 2021: Normal sinus rhythm at 63.  No ST or T wave changes.  Recent Labs: 03/02/2021: ALT <5; Platelets 510 03/03/2021: BUN 12; Creatinine, Ser 0.80; Hemoglobin 13.6; Hemoglobin 13.3; Potassium 4.4; Potassium 4.4; Sodium 135; Sodium 134  Recent Lipid Panel    Component Value Date/Time   CHOL 321 (H) 12/19/2018 0303   TRIG 547 (H) 12/19/2018 0303   HDL 28 (L) 12/19/2018 0303   CHOLHDL 11.5 12/19/2018 0303   VLDL UNABLE TO CALCULATE IF TRIGLYCERIDE OVER 400 mg/dL 05/39/7673 4193   LDLCALC UNABLE TO CALCULATE IF TRIGLYCERIDE OVER 400 mg/dL 79/12/4095 3532   LDLDIRECT 182 (H) 12/19/2018 0851     Risk Assessment/Calculations:           Physical Exam:    VS:  BP (!) 162/98 (BP Location: Left Arm, Patient Position: Sitting, Cuff Size: Normal)    Pulse 63    Ht 5\' 5"  (1.651 m)    Wt 188 lb 6.4 oz (85.5 kg)    SpO2 97%    BMI 31.35 kg/m     Wt Readings from Last 3 Encounters:  12/20/21 188 lb 6.4 oz (85.5 kg)  09/27/21 173 lb (78.5 kg)  07/05/21 186 lb (84.4 kg)     GEN:  Well nourished, well developed in no acute distress HEENT: Normal NECK: No JVD; No carotid bruits LYMPHATICS: No lymphadenopathy CARDIAC:  RR , no murmur  RESPIRATORY:  Clear to auscultation without rales, wheezing or rhonchi  ABDOMEN: Soft, non-tender, non-distended MUSCULOSKELETAL:  No edema; No deformity  SKIN: Warm and dry NEUROLOGIC:  Alert and oriented x 3 PSYCHIATRIC:  Normal affect   ASSESSMENT:    1. Essential hypertension, benign   2. Medication management     PLAN:    In order of problems listed above:  HTN:   will add hydrochlorothiazide 25 mg a day and potassium chloride 10 mill equivalents a day.  He still eats a very high salt and high fat diet.  I have counseled him about avoiding these high salt and high fat  foods.  We will have him see the hypertension clinic in 3 to 4 weeks.  I anticipate the addition of ARB such as valsartan or perhaps other medications for his hypertension.  2.  Coronary artery calcifications: He is completely asymptomatic.  I do not think that we would be able to get a stress test given his lack of symptoms.  He has a family history of coronary artery disease.  I strongly advised him to work on reducing his intake of fat and also to stop smoking.  We will have him follow-up with the hypertension clinic in several weeks and will have him see an APP in 6 months.           Medication Adjustments/Labs and Tests Ordered: Current medicines are reviewed at length with the patient today.  Concerns regarding medicines are outlined above.  Orders Placed This Encounter  Procedures   Basic metabolic panel   Ambulatory referral to [facility] [address]   EKG 12-Lead   Meds ordered this encounter  Medications   hydrochlorothiazide (HYDRODIURIL) 25 MG tablet    Sig: Take 1 tablet (25 mg total) by mouth daily.    Dispense:  90 tablet    Refill:  3   potassium chloride (KLOR-CON) 10 MEQ tablet    Sig: Take 1 tablet (10 mEq total) by mouth daily.    Dispense:  90 tablet    Refill:  3    Patient Instructions  Medication Instructions:  Your physician has recommended you make the following change in your medication:   START Hydrochlorothiazide 25 mg taking 1 daily   START Potassium 10 meq taking 1 daily   *If you need a refill on your cardiac medications before your next appointment, please call your pharmacy*   Lab Work: WHEN YOU COME IN FOR HYPERTENSION CLINIC, WE WILL DRAW A :   BMET  If you have labs (blood work) drawn today and your tests are completely normal, you will receive your results only by: MyChart Message (if you have MyChart) OR A paper copy in the mail If you have any lab test that is abnormal or we need to change your treatment, we will call you to review the results.   Testing/Procedures: None ordered  You have been referred to HYPERTENSION CLINIC.      Follow-Up: At [facility], you and your health needs are our priority.  As part of our continuing mission to provide you with exceptional heart care, we have created designated Provider Care Teams.  These Care Teams include your primary Cardiologist (physician) and Advanced Practice Providers (APPs -  Physician Assistants and Nurse Practitioners) who all work together to provide you with the care you need, when you need it.  We recommend signing up for the patient portal called "MyChart".  Sign up information is provided on this After Visit Summary.  MyChart is used to connect with patients for Virtual Visits (Telemedicine).  Patients are able to view lab/test results, encounter notes, upcoming appointments, etc.  Non-urgent messages can be sent to your provider as well.   To learn more about what you can do with MyChart, go to [email or web address].    Your next appointment:   6 month(s)  The format for your next appointment:   In Person  Provider:   Chelsea AusVin Bhagat, PA-C or Tereso NewcomerScott Weaver, PA-C         Other Instructions     Signed, Kristeen MissPhilip Cashius Grandstaff, MD  12/20/2021 1:40 PM  [facility]

## 2021-12-20 ENCOUNTER — Other Ambulatory Visit: Payer: Self-pay

## 2021-12-20 ENCOUNTER — Encounter: Payer: Self-pay | Admitting: Cardiovascular Disease

## 2021-12-20 ENCOUNTER — Ambulatory Visit: Payer: Managed Care, Other (non HMO) | Admitting: Cardiovascular Disease

## 2021-12-20 VITALS — BP 162/98 | HR 63 | Ht 65.0 in | Wt 188.4 lb

## 2021-12-20 DIAGNOSIS — Z79899 Other long term (current) drug therapy: Secondary | ICD-10-CM

## 2021-12-20 DIAGNOSIS — I10 Essential (primary) hypertension: Secondary | ICD-10-CM | POA: Diagnosis not present

## 2021-12-20 MED ORDER — HYDROCHLOROTHIAZIDE 25 MG PO TABS: 25.0000 mg | ORAL_TABLET | Freq: Every day | ORAL | 3 refills | Status: AC

## 2021-12-20 MED ORDER — POTASSIUM CHLORIDE ER 10 MEQ PO TBCR: 10.0000 meq | EXTENDED_RELEASE_TABLET | Freq: Every day | ORAL | 3 refills | Status: AC

## 2021-12-20 NOTE — Patient Instructions (Signed)
Medication Instructions:  Your physician has recommended you make the following change in your medication:   START Hydrochlorothiazide 25 mg taking 1 daily   START Potassium 10 meq taking 1 daily   *If you need a refill on your cardiac medications before your next appointment, please call your pharmacy*   Lab Work: WHEN YOU COME IN FOR HYPERTENSION CLINIC, WE WILL DRAW A :  BMET  If you have labs (blood work) drawn today and your tests are completely normal, you will receive your results only by: MyChart Message (if you have MyChart) OR A paper copy in the mail If you have any lab test that is abnormal or we need to change your treatment, we will call you to review the results.   Testing/Procedures: None ordered  You have been referred to HYPERTENSION CLINIC.      Follow-Up: At [facility], you and your health needs are our priority.  As part of our continuing mission to provide you with exceptional heart care, we have created designated Provider Care Teams.  These Care Teams include your primary Cardiologist (physician) and Advanced Practice Providers (APPs -  Physician Assistants and Nurse Practitioners) who all work together to provide you with the care you need, when you need it.  We recommend signing up for the patient portal called "MyChart".  Sign up information is provided on this After Visit Summary.  MyChart is used to connect with patients for Virtual Visits (Telemedicine).  Patients are able to view lab/test results, encounter notes, upcoming appointments, etc.  Non-urgent messages can be sent to your provider as well.   To learn more about what you can do with MyChart, go to [email or web address].    Your next appointment:   6 month(s)  The format for your next appointment:   In Person  Provider:   Chelsea Aus, PA-C or Tereso Newcomer, PA-C         Other Instructions

## 2022-01-13 ENCOUNTER — Ambulatory Visit: Payer: Managed Care, Other (non HMO)

## 2022-01-13 ENCOUNTER — Other Ambulatory Visit: Payer: Managed Care, Other (non HMO)

## 2022-02-03 ENCOUNTER — Other Ambulatory Visit: Payer: Managed Care, Other (non HMO)

## 2022-02-03 ENCOUNTER — Ambulatory Visit: Payer: Managed Care, Other (non HMO)

## 2022-09-06 ENCOUNTER — Other Ambulatory Visit: Payer: Self-pay | Admitting: Internal Medicine

## 2022-09-06 DIAGNOSIS — Z720 Tobacco use: Secondary | ICD-10-CM

## 2022-10-06 ENCOUNTER — Encounter (HOSPITAL_COMMUNITY): Payer: Self-pay

## 2022-10-06 ENCOUNTER — Emergency Department (HOSPITAL_COMMUNITY): Payer: Commercial Managed Care - HMO

## 2022-10-06 ENCOUNTER — Emergency Department (HOSPITAL_COMMUNITY)
Admission: EM | Admit: 2022-10-06 | Discharge: 2022-10-06 | Disposition: A | Discharge status: Home / Self Care | Payer: Commercial Managed Care - HMO | Attending: Emergency Medicine | Admitting: Emergency Medicine

## 2022-10-06 ENCOUNTER — Other Ambulatory Visit: Payer: Self-pay

## 2022-10-06 DIAGNOSIS — E119 Type 2 diabetes mellitus without complications: Secondary | ICD-10-CM | POA: Insufficient documentation

## 2022-10-06 DIAGNOSIS — Z7984 Long term (current) use of oral hypoglycemic drugs: Secondary | ICD-10-CM | POA: Insufficient documentation

## 2022-10-06 DIAGNOSIS — Z79899 Other long term (current) drug therapy: Secondary | ICD-10-CM | POA: Insufficient documentation

## 2022-10-06 DIAGNOSIS — R109 Unspecified abdominal pain: Secondary | ICD-10-CM | POA: Diagnosis present

## 2022-10-06 DIAGNOSIS — R112 Nausea with vomiting, unspecified: Secondary | ICD-10-CM | POA: Insufficient documentation

## 2022-10-06 DIAGNOSIS — I10 Essential (primary) hypertension: Secondary | ICD-10-CM | POA: Diagnosis not present

## 2022-10-06 DIAGNOSIS — I743 Embolism and thrombosis of arteries of the lower extremities: Secondary | ICD-10-CM | POA: Insufficient documentation

## 2022-10-06 DIAGNOSIS — Z7982 Long term (current) use of aspirin: Secondary | ICD-10-CM | POA: Insufficient documentation

## 2022-10-06 DIAGNOSIS — R1013 Epigastric pain: Secondary | ICD-10-CM | POA: Insufficient documentation

## 2022-10-06 DIAGNOSIS — I70201 Unspecified atherosclerosis of native arteries of extremities, right leg: Secondary | ICD-10-CM

## 2022-10-06 LAB — CBC WITH DIFFERENTIAL/PLATELET
Abs Immature Granulocytes: 0.02 10*3/uL (ref 0.00–0.07)
Basophils Absolute: 0.1 10*3/uL (ref 0.0–0.1)
Basophils Relative: 1 %
Eosinophils Absolute: 0.1 10*3/uL (ref 0.0–0.5)
Eosinophils Relative: 1 %
HCT: 47.8 % (ref 39.0–52.0)
Hemoglobin: 16.6 g/dL (ref 13.0–17.0)
Immature Granulocytes: 0 %
Lymphocytes Relative: 34 %
Lymphs Abs: 2.8 10*3/uL (ref 0.7–4.0)
MCH: 28.2 pg (ref 26.0–34.0)
MCHC: 34.7 g/dL (ref 30.0–36.0)
MCV: 81.3 fL (ref 80.0–100.0)
Monocytes Absolute: 0.4 10*3/uL (ref 0.1–1.0)
Monocytes Relative: 5 %
Neutro Abs: 4.9 10*3/uL (ref 1.7–7.7)
Neutrophils Relative %: 59 %
Platelets: 280 10*3/uL (ref 150–400)
RBC: 5.88 MIL/uL — ABNORMAL HIGH (ref 4.22–5.81)
RDW: 13.1 % (ref 11.5–15.5)
WBC: 8.3 10*3/uL (ref 4.0–10.5)
nRBC: 0 % (ref 0.0–0.2)

## 2022-10-06 LAB — COMPREHENSIVE METABOLIC PANEL
ALT: 29 U/L (ref 0–44)
AST: 43 U/L — ABNORMAL HIGH (ref 15–41)
Albumin: 4.2 g/dL (ref 3.5–5.0)
Alkaline Phosphatase: 37 U/L — ABNORMAL LOW (ref 38–126)
Anion gap: 12 (ref 5–15)
BUN: 14 mg/dL (ref 6–20)
CO2: 23 mmol/L (ref 22–32)
Calcium: 9.8 mg/dL (ref 8.9–10.3)
Chloride: 96 mmol/L — ABNORMAL LOW (ref 98–111)
Creatinine, Ser: 0.9 mg/dL (ref 0.61–1.24)
GFR, Estimated: >60 mL/min (ref >60)
Glucose, Bld: 137 mg/dL — ABNORMAL HIGH (ref 70–99)
Potassium: 4.1 mmol/L (ref 3.5–5.1)
Sodium: 131 mmol/L — ABNORMAL LOW (ref 135–145)
Total Bilirubin: 1.4 mg/dL — ABNORMAL HIGH (ref 0.3–1.2)
Total Protein: 7.8 g/dL (ref 6.5–8.1)

## 2022-10-06 LAB — URINALYSIS, ROUTINE W REFLEX MICROSCOPIC
Bilirubin Urine: NEGATIVE
Glucose, UA: NEGATIVE mg/dL
Hgb urine dipstick: NEGATIVE
Ketones, ur: NEGATIVE mg/dL
Leukocytes,Ua: NEGATIVE
Nitrite: NEGATIVE
Protein, ur: NEGATIVE mg/dL
Specific Gravity, Urine: 1.02 (ref 1.005–1.030)
pH: 7 (ref 5.0–8.0)

## 2022-10-06 LAB — LIPASE, BLOOD: Lipase: 88 U/L — ABNORMAL HIGH (ref 11–51)

## 2022-10-06 MED ORDER — FENTANYL CITRATE PF 50 MCG/ML IJ SOSY
50.0000 ug | PREFILLED_SYRINGE | Freq: Once | INTRAMUSCULAR | Status: AC
  Administered 2022-10-06: 50 ug via INTRAVENOUS
  Filled 2022-10-06: qty 1

## 2022-10-06 MED ORDER — IOHEXOL 350 MG/ML SOLN
75.0000 mL | Freq: Once | INTRAVENOUS | Status: AC | PRN
  Administered 2022-10-06: 75 mL via INTRAVENOUS

## 2022-10-06 MED ORDER — ONDANSETRON HCL 4 MG/2ML IJ SOLN
4.0000 mg | Freq: Once | INTRAMUSCULAR | Status: AC
  Administered 2022-10-06: 4 mg via INTRAVENOUS
  Filled 2022-10-06: qty 2

## 2022-10-06 MED ORDER — LACTATED RINGERS IV BOLUS
1000.0000 mL | Freq: Once | INTRAVENOUS | Status: AC
  Administered 2022-10-06: 1000 mL via INTRAVENOUS

## 2022-10-06 NOTE — ED Notes (Signed)
Patient transported to CT 

## 2022-10-06 NOTE — ED Provider Triage Note (Addendum)
Emergency Medicine Provider Triage Evaluation Note  Jordan [name] , a 56 y.o. male  was evaluated in triage.  Pt complains of worsening nausea, abdominal pain, and vomiting since this morning.  Abdominal pain in the epigastric region, described as strong/burning.  Vomiting initially yellow/bilious, after ate, then became clear and food related.  Denies hematemesis, melena, or hematochezia.  Denies recent URI symptoms, fever, chills, shortness of breath, chest pain.  Feels like previous pancreatitis episodes per pt, last episode 3 years ago.  Review of Systems  Positive:  Negative: See above  Physical Exam  BP (!) 118/91 (BP Location: Left Arm)   Pulse (!) 58   Temp 98.3 F (36.8 C) (Oral)   Resp 16   Ht 5\' 5"  (1.651 m)   Wt 81.6 kg   SpO2 97%   BMI 29.95 kg/m  Gen:   Awake, no distress   Resp:  Normal effort  MSK:   Moves extremities without difficulty  Other:  Epigastric tenderness, abdomen soft.  No flank tenderness.    Medical Decision Making  Medically screening exam initiated at 2:31 PM.  Appropriate orders placed.  Trevin Gartrell was informed that the remainder of the evaluation will be completed by another provider, this initial triage assessment does not replace that evaluation, and the importance of remaining in the ED until their evaluation is complete.     Kela Millin, PA-C 10/06/22 1438    10/08/22, PA-C 10/06/22 1438

## 2022-10-06 NOTE — ED Triage Notes (Signed)
Pt arrived POV from home c/o abdominal pain and N/V. Pt states he might be having a pancreatitis flare up.

## 2022-10-06 NOTE — Discharge Instructions (Addendum)
Please follow-up with your vascular surgeon about the incidental findings on your CT imaging today, which include occlusion of your right femoral artery.  It is very important that you see your vascular surgeon as soon as possible to discuss this and further testing.  Please return to the emergency department if you have severe new abdominal pain with recurrent vomiting and inability to keep any food down, chest pain, difficulty breathing, changes in sensation in your legs or feet, significant pain in your legs or feet, or if you have any other reason to think you need emergency care.  We hope you feel better soon.

## 2022-10-06 NOTE — ED Notes (Signed)
RN reviewed discharge instructions with pt. Pt verbalized understanding and had no further questions. VSS upon discharge.  

## 2022-10-06 NOTE — ED Provider Notes (Signed)
[facility] EMERGENCY DEPARTMENT Provider Note   CSN: [id number] Arrival date & time: 10/06/22  1340     History  Chief Complaint  Patient presents with   Abdominal Pain    Jordan [name] is a 56 year old male with history of hypertriglyceridemia, type 2 diabetes, hypertension, PAD, pancreatic lesion who presents to the ED for abdominal pain.  The patient reports that it started this morning, is epigastric, associated with nausea and a small amount of nonbloody nonbilious emesis.  He states that he feels like prior episodes of pancreatitis, which he states occurred because of hypertriglyceridemia.  He is currently on cholesterol treatment, and reports improvement in his numbers.  He reports that he continues to have bowel movements, last this morning, with no constipation or diarrhea.  No fevers.  No prior abdominal surgeries.  No chest pain or shortness of breath, dysuria or urinary frequency.  He did not try any pain medications at home.  The history is provided by the patient and medical records.  Abdominal Pain      Home Medications Prior to Admission medications   Medication Sig Start Date End Date Taking? Authorizing Provider  acetaminophen (TYLENOL) 325 MG tablet Take by mouth.    [provider]  Alcohol Swabs (ALCOHOL WIPES) 70 % PADS  11/12/19   [provider]  amLODipine (NORVASC) 10 MG tablet Take by mouth. 11/13/19   [provider]  aspirin EC 81 MG EC tablet Take 1 tablet (81 mg total) by mouth daily. 12/20/18   Santos-Sanchez, Merlene Morse, MD  Blood Glucose Monitoring Suppl (GLUCOCOM BLOOD GLUCOSE MONITOR) DEVI Use meter to check blood sugar twice times a day.  Please dispense based on availability, insurance coverage, and patient preference. 11/12/19   [provider]  fenofibrate (TRICOR) 145 MG tablet Take 1 tablet (145 mg total) by mouth daily. 01/06/19   Santos-Sanchez, Merlene Morse, MD  glucose blood (PRECISION QID TEST)  test strip Use 1 strip to check blood sugar 2 times a day. Please dispense based on availability, insurance coverage, and patient preference. 11/12/19   [provider]  hydrochlorothiazide (HYDRODIURIL) 25 MG tablet Take 1 tablet (25 mg total) by mouth daily. 12/20/21 03/20/22  Nahser, Wonda Cheng, MD  JARDIANCE 10 MG TABS tablet Take 10 mg by mouth daily. Patient not taking: Reported on 12/20/2021 06/25/20   [provider]  losartan (COZAAR) 50 MG tablet Take 50 mg by mouth daily. Patient not taking: Reported on 12/20/2021 07/05/21   [provider]  metFORMIN (GLUCOPHAGE-XR) 500 MG 24 hr tablet Take 500 mg by mouth 2 (two) times daily. Patient not taking: Reported on 12/20/2021 06/16/20   [provider]  Omega-3 Fatty Acids (FISH OIL) 1000 MG CAPS Take by mouth. Patient not taking: Reported on 07/05/2021 05/21/19   [provider]  omeprazole (PRILOSEC) 20 MG capsule Take 1 capsule (20 mg total) by mouth daily. Patient not taking: Reported on 12/20/2021 03/03/21   Ezequiel Essex, MD  ondansetron (ZOFRAN ODT) 4 MG disintegrating tablet Take 1 tablet (4 mg total) by mouth every 8 (eight) hours as needed for nausea or vomiting. Patient not taking: Reported on 12/20/2021 03/03/21   Rancour, Annie Main, MD  potassium chloride (KLOR-CON) 10 MEQ tablet Take 1 tablet (10 mEq total) by mouth daily. 12/20/21 03/20/22  Nahser, Wonda Cheng, MD  rosuvastatin (CRESTOR) 10 MG tablet Take 10 mg by mouth at bedtime. 05/21/20   [provider]  sildenafil (VIAGRA) 50 MG tablet Take  by mouth. 11/07/19   [provider]      Allergies    Fish allergy    Review of Systems   Review of Systems  Gastrointestinal:  Positive for abdominal pain.    Physical Exam Updated Vital Signs BP (!) 152/93   Pulse (!) 58   Temp 98.1 F (36.7 C) (Oral)   Resp 18   Ht 5\' 5"  (1.651 m)   Wt 81.6 kg   SpO2 99%   BMI 29.95 kg/m   Physical Exam Vitals and nursing note reviewed.   Constitutional:      General: He is not in acute distress.    Appearance: He is not toxic-appearing.  HENT:     Head: Normocephalic and atraumatic.  Eyes:     Extraocular Movements: Extraocular movements intact.  Cardiovascular:     Rate and Rhythm: Normal rate and regular rhythm.     Heart sounds: Normal heart sounds.  Pulmonary:     Effort: Pulmonary effort is normal.     Breath sounds: Normal breath sounds.  Abdominal:     Palpations: Abdomen is soft.     Tenderness: There is generalized abdominal tenderness. There is no right CVA tenderness, left CVA tenderness, guarding or rebound. Negative signs include Murphy's sign.     Hernia: No hernia is present.  Skin:    General: Skin is warm and dry.  Neurological:     General: No focal deficit present.     Mental Status: He is alert and oriented to person, place, and time.     Motor: No weakness.  Psychiatric:        Mood and Affect: Mood normal.        Behavior: Behavior normal.     ED Results / Procedures / Treatments   Labs (all labs ordered are listed, but only abnormal results are displayed) Labs Reviewed  CBC WITH DIFFERENTIAL/PLATELET - Abnormal; Notable for the following components:      Result Value   RBC 5.88 (*)    All other components within normal limits  URINALYSIS, ROUTINE W REFLEX MICROSCOPIC - Abnormal; Notable for the following components:   Color, Urine AMBER (*)    APPearance HAZY (*)    All other components within normal limits  COMPREHENSIVE METABOLIC PANEL - Abnormal; Notable for the following components:   Sodium 131 (*)    Chloride 96 (*)    Glucose, Bld 137 (*)    AST 43 (*)    Alkaline Phosphatase 37 (*)    Total Bilirubin 1.4 (*)    All other components within normal limits  LIPASE, BLOOD - Abnormal; Notable for the following components:   Lipase 88 (*)    All other components within normal limits    EKG None  Radiology CT ABDOMEN PELVIS W CONTRAST  Result Date:  10/06/2022 CLINICAL DATA:  Abdominal pain EXAM: CT ABDOMEN AND PELVIS WITH CONTRAST TECHNIQUE: Multidetector CT imaging of the abdomen and pelvis was performed using the standard protocol following bolus administration of intravenous contrast. RADIATION DOSE REDUCTION: This exam was performed according to the departmental dose-optimization program which includes automated exposure control, adjustment of the mA and/or kV according to patient size and/or use of iterative reconstruction technique. CONTRAST:  12mL OMNIPAQUE IOHEXOL 350 MG/ML SOLN COMPARISON:  03/03/2021 FINDINGS: Lower chest: Visualized lower lung fields are clear. Hepatobiliary: There is fatty infiltration in liver. There is no dilation of bile ducts. Gallbladder is unremarkable. Pancreas: No focal abnormalities are seen. 8 mm  low-density in the neck of the pancreas seen in the previous study is not distinctly visualized in the current study. Spleen: Unremarkable. Adrenals/Urinary Tract: Adrenals are unremarkable. There is no hydronephrosis. There are no renal or ureteral stones. Urinary bladder is unremarkable. Stomach/Bowel: Stomach is unremarkable. Small bowel loops are not dilated. Appendix is not dilated. Scattered diverticula are seen in colon without signs of focal acute diverticulitis. Vascular/Lymphatic: There are scattered small calcifications in abdominal aorta. There is aneurysmal dilation in right common iliac artery measuring 3.9 x 3.6 cm. The aneurism measured 2.8 cm in the previous study done on 03/03/2021. There is 1.7 x 1.5 cm aneurism in proximal course of right femoral artery. There is possible high-grade stenosis or occlusion of right femoral artery immediately distal to this aneurysm. There is 2 cm aneurysm in the left common femoral artery. There is possible occlusion of left femoral artery at its origin. Reproductive: Unremarkable. Other: There is no ascites or pneumoperitoneum. Bilateral inguinal hernias containing fat are  seen. Musculoskeletal: No acute findings are seen. IMPRESSION: There is no evidence of intestinal obstruction or pneumoperitoneum. There is no hydronephrosis. Appendix is not dilated. 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 There is 1.7 cm aneurism in the proximal right femoral artery and possible occlusion of proximal course of right femoral artery. There is 2 cm aneurysm in the left common femoral artery. There is occlusion of proximal course of left femoral artery. Vascular surgery consultation should be considered. Fatty liver. Few diverticula are seen in colon without signs of focal diverticulitis. Other findings as described in the body of the report. Electronically Signed   By: Ernie Avena M.D.   On: 10/06/2022 20:46    Procedures Ultrasound ED Peripheral IV (Provider)  Date/Time: 10/06/2022 5:45 PM  Performed by: Stephanie Coup, MD Authorized by: Terald Sleeper, MD   Procedure details:    Indications: poor IV access     Skin Prep: isopropyl alcohol     Location:  Left AC   Angiocath:  20 G   Bedside Ultrasound Guided: Yes     Images: not archived     Patient tolerated procedure without complications: Yes     Dressing applied: Yes       Medications Ordered in ED Medications  lactated ringers bolus 1,000 mL (0 mLs Intravenous Stopped 10/06/22 1857)  fentaNYL (SUBLIMAZE) injection 50 mcg (50 mcg Intravenous Given 10/06/22 1756)  ondansetron (ZOFRAN) injection 4 mg (4 mg Intravenous Given 10/06/22 1803)  iohexol (OMNIPAQUE) 350 MG/ML injection 75 mL (75 mLs Intravenous Contrast Given 10/06/22 2023)    ED Course/ Medical Decision Making/ A&P Clinical Course as of 10/06/22 2357  Fri Oct 06, 2022  1821 56 yo male w/ chronic pancreatitis here with abdominal pain.  Pending labs, CT abdomen.  Clinically well appearing otherwise; does not appear in any distress on my exam.  I suspect this is likely chronic  pancreatitis flareup.  He reports he drinks alcohol in small amounts, no binge drinking.  He reports a history of hypertriglyceridemia which may have been the cause for his pancreatitis in the past.  We are pending labs and CT [MT]  2057 OP vasc notes:  He had a left femoropopliteal bypass with great saphenous vein on 06/13/2019 by Dr. Willette Pa for claudication.  In reviewing the notes this was noted to be occluded on follow-up on 07/21/2019.  He was offered thrombolysis and refused as he thought he had some  symptom improvement. [DG]    Clinical Course User Index [DG] Renard Matter, MD [MT] Wyvonnia Dusky, MD                           Medical Decision Making Problems Addressed: Epigastric pain: acute illness or injury that poses a threat to life or bodily functions Femoral artery occlusion, right [facility]): acute illness or injury that poses a threat to life or bodily functions  Amount and/or Complexity of Data Reviewed External Data Reviewed: labs and notes. Labs: ordered. Decision-making details documented in ED Course. Radiology: ordered and independent interpretation performed. Decision-making details documented in ED Course.  Risk Prescription drug management.   Bol Masiello is a 56 y.o. male who presents with abdominal pain and emesis as per above.  I have reviewed the nursing documentation for past medical history, family history, and social history.  He is awake, alert, and hemodynamically stable. His exam is most notable for epigastric tenderness to palpation.  Differential Diagnoses: Initial suspicion highest for possible pancreatitis given reported prior history with similar presentation and epigastric pain periodically radiating to back.  I do not think the patient has appendicitis, as there is no fever, anorexia, rebound tenderness or guarding, or right lower quadrant tenderness.  Biliary colic, cholecystitis, or cholangitis seem unlikely given pain not located in RUQ, no  jaundice, no fevers/chills, and negative Murphy's sign. Bowel obstruction seems unlikely in this patient without diffuse crampy pain, bloating, lack of flatus or stool passage, or prior abdominal surgeries or obstruction. Bowel sounds are normal on exam. The patient has no flank/groin pain, CVA tenderness, hematuria, or fever to suggest nephrolithiasis or pyelonephritis. There is no evidence of hernia on exam. I do not think this is perforated viscus, as the patient has no rigidity/peritonitis, distress, fever, or hemodynamic instability. Additionally, I do not think he has testicular torsion, prostatitis, epididymitis, or orchitis as he denies any genitourinary symptoms or lower abdominal pain.  Workup:  CBC unremarkable, no leukocytosis to suggest significant infection, Hgb within normal limits at 16.6. CMP with very mild hyponatremia to 131, Cl 96, consistent with vomiting. Glucose 137. No elevation in anion gap to suggest significant lactic acidosis. Creatining WNL at 0.9, Ast 43, ALT 29. Lipase 88, not meeting pancreatitis criteria. UA with negative nitrites, negative leukocytes, no evidence of UTI.   CTAP on my independent review as well as review by Radiology shows no acute intra-abdominal pathology including no signs of pancreatitis. Incidental findings of increased size of right common iliac aneurysm, possible occlusion of proximal right femoral artery and occlusion of proximal left common femoral artery.  Interventions:  While in the ED, the patient received 1L IVFs, zofran, fentanyl with significant improvement.   Re-Assessment: Patient hemodynamically stable and well-appearing on reassessment.  He reports significant improvement in his abdominal symptoms at this time.  We discussed the incidental findings of possible right proximal femoral artery occlusion. I recommended patient stay for Vascular surgery consultation however patient declined and wants to go home. We discussed the risks of an  arterial occlusion going undetected and he was able to voice understanding of these risks and demonstrated good capacity to make decisions. Patient does have faint but palpable DP pulses and foot are warm without lower extremity pain, doubt critical limb ischemia at this time. Patient instructed to follow up with his vascular surgeon as soon as possible as an outpatient, and was provided with the phone number to their clinic.  Strict  return precautions were given.  Patient voiced understanding of and agreement with this plan, and was discharged in stable condition.    Note: Estate manager/land agent was used in the creation of this note.         Final Clinical Impression(s) / ED Diagnoses Final diagnoses:  Epigastric pain  Femoral artery occlusion, right [facility])    Rx / DC Orders ED Discharge Orders     None         Renard Matter, MD 10/06/22 2357    Wyvonnia Dusky, MD 10/07/22 1359

## 2022-10-19 ENCOUNTER — Ambulatory Visit
Admission: RE | Admit: 2022-10-19 | Discharge: 2022-10-19 | Disposition: A | Discharge status: Home / Self Care | Payer: Commercial Managed Care - HMO | Source: Ambulatory Visit | Attending: Internal Medicine | Admitting: Internal Medicine

## 2022-10-19 DIAGNOSIS — Z720 Tobacco use: Secondary | ICD-10-CM

## 2022-10-20 ENCOUNTER — Other Ambulatory Visit: Payer: Self-pay | Admitting: *Deleted

## 2022-10-20 DIAGNOSIS — I739 Peripheral vascular disease, unspecified: Secondary | ICD-10-CM

## 2022-10-25 NOTE — Progress Notes (Unsigned)
Office Note    History of Present Illness   Jordan [name] is a 56 y.o. (11-06-1966) male who presents for surveillance of PAD. He has a history of left femoropopliteal artery bypass with GSV by Dr.Sheehan at [facility] on 06/12/2022 for claudication. This was known to be occluded on follow up on 07/21/2019. He was previously was offered thrombolysis but did not want it since he had some symptom improvement. At his last visit with [place], he was still having claudication in both calves. 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  At follow up today with [place], he believes his symptoms are the same. He denies any rest pain or tissue loss. He has bilateral calf claudication.    He is/is not smoking***  Current Outpatient Medications  Medication Sig Dispense Refill   acetaminophen (TYLENOL) 325 MG tablet Take by mouth.     Alcohol Swabs (ALCOHOL WIPES) 70 % PADS      amLODipine (NORVASC) 10 MG tablet Take by mouth.     aspirin EC 81 MG EC tablet Take 1 tablet (81 mg total) by mouth daily. 30 tablet 0   Blood Glucose Monitoring Suppl (GLUCOCOM BLOOD GLUCOSE MONITOR) DEVI Use meter to check blood sugar twice times a day.  Please dispense based on availability, insurance coverage, and patient preference.     fenofibrate (TRICOR) 145 MG tablet Take 1 tablet (145 mg total) by mouth daily. 30 tablet 0   glucose blood (PRECISION QID TEST) test strip Use 1 strip to check blood sugar 2 times a day. Please dispense based on availability, insurance coverage, and patient preference.     hydrochlorothiazide (HYDRODIURIL) 25 MG tablet Take 1 tablet (25 mg total) by mouth daily. 90 tablet 3   JARDIANCE 10 MG TABS tablet Take 10 mg by mouth daily. (Patient not taking: Reported on 12/20/2021)     losartan (COZAAR) 50 MG tablet Take 50 mg by mouth daily. (Patient not taking: Reported on 12/20/2021)     metFORMIN (GLUCOPHAGE-XR) 500 MG 24 hr tablet Take 500 mg by mouth 2 (two) times daily. (Patient not taking:  Reported on 12/20/2021)     Omega-3 Fatty Acids (FISH OIL) 1000 MG CAPS Take by mouth. (Patient not taking: Reported on 07/05/2021)     omeprazole (PRILOSEC) 20 MG capsule Take 1 capsule (20 mg total) by mouth daily. (Patient not taking: Reported on 12/20/2021) 30 capsule 0   ondansetron (ZOFRAN ODT) 4 MG disintegrating tablet Take 1 tablet (4 mg total) by mouth every 8 (eight) hours as needed for nausea or vomiting. (Patient not taking: Reported on 12/20/2021) 20 tablet 0   potassium chloride (KLOR-CON) 10 MEQ tablet Take 1 tablet (10 mEq total) by mouth daily. 90 tablet 3   rosuvastatin (CRESTOR) 10 MG tablet Take 10 mg by mouth at bedtime.     sildenafil (VIAGRA) 50 MG tablet Take by mouth.     No current facility-administered medications for this visit.    REVIEW OF SYSTEMS (negative unless checked):   Cardiac:  []  Chest pain or chest pressure? []  Shortness of breath upon activity? []  Shortness of breath when lying flat? []  Irregular heart rhythm?  Vascular:  [x]  Pain in calf, thigh, or hip brought on by walking? []  Pain in feet at night that wakes you up from your sleep? []  Blood clot in your veins? []  Leg swelling?  Pulmonary:  []  Oxygen at home? []  Productive cough? []  Wheezing?  Neurologic:  []   Sudden weakness in arms or legs? []  Sudden numbness in arms or legs? []  Sudden onset of difficult speaking or slurred speech? []  Temporary loss of vision in one eye? []  Problems with dizziness?  Gastrointestinal:  []  Blood in stool? []  Vomited blood?  Genitourinary:  []  Burning when urinating? []  Blood in urine?  Psychiatric:  []  Major depression  Hematologic:  []  Bleeding problems? []  Problems with blood clotting?  Dermatologic:  []  Rashes or ulcers?  Constitutional:  []  Fever or chills?  Ear/Nose/Throat:  []  Change in hearing? []  Nose bleeds? []  Sore throat?  Musculoskeletal:  []  Back pain? []  Joint pain? []  Muscle pain?   Physical Examination   ***There were no vitals filed for this visit. ***There is no height or weight on file to calculate BMI.  General:  WDWN in NAD; vital signs documented above Gait: Not observed HENT: WNL, normocephalic Pulmonary: normal non-labored breathing , CTAB Cardiac: regular rate and rhythm, no carotid bruit Abdomen: soft, NT, no masses Skin: without rashes Vascular Exam/Pulses:  Extremities: without ischemic changes, without gangrene , without cellulitis; without open wounds;  Musculoskeletal: no muscle wasting or atrophy  Neurologic: A&O X 3;  No focal weakness or paresthesias are detected Psychiatric:  The pt has Normal affect.  Non-Invasive Vascular imaging   ABI (10/25/2022)   Medical Decision Making   Jordan [name] is a 56 y.o. male who presents with:  {Side:17767} leg intermittent claudication without evidence of critical limb ischemia.  Based on the patient's vascular studies, his ABIs are ***. His ABIs at his last visit with 1 yr ago were 0.69 on the right and 0.56 on the left with monophasic flow. If symptoms were to progress we could pursue arteriogram with revascularization I discussed in depth with the patient the nature of atherosclerosis, and emphasized the importance of maximal medical management including strict control of blood pressure, blood glucose, and lipid levels, antiplatelet agents, obtaining regular exercise, and cessation of smoking.   The patient is aware that without maximal medical management the underlying atherosclerotic disease process will progress, limiting the benefit of any interventions. Continue ASA and statin Thank you for allowing to participate in this patient's care.   , PA-C [facility] Office: [phone]  Clinic MD: 

## 2022-10-26 ENCOUNTER — Ambulatory Visit (HOSPITAL_COMMUNITY)
Admission: RE | Admit: 2022-10-26 | Discharge: 2022-10-26 | Disposition: A | Discharge status: Home / Self Care | Payer: Commercial Managed Care - HMO | Source: Ambulatory Visit | Attending: Vascular Surgery | Admitting: Vascular Surgery

## 2022-10-26 ENCOUNTER — Ambulatory Visit: Payer: Commercial Managed Care - HMO | Admitting: Physician Assistant

## 2022-10-26 VITALS — BP 125/82 | HR 62 | Temp 98.0°F | Ht 65.0 in | Wt 180.0 lb

## 2022-10-26 DIAGNOSIS — I739 Peripheral vascular disease, unspecified: Secondary | ICD-10-CM

## 2022-12-06 ENCOUNTER — Other Ambulatory Visit: Payer: Self-pay | Admitting: Registered Nurse

## 2022-12-06 DIAGNOSIS — R1011 Right upper quadrant pain: Secondary | ICD-10-CM

## 2022-12-14 ENCOUNTER — Ambulatory Visit
Admission: RE | Admit: 2022-12-14 | Discharge: 2022-12-14 | Disposition: A | Discharge status: Home / Self Care | Payer: Commercial Managed Care - HMO | Source: Ambulatory Visit | Attending: Registered Nurse | Admitting: Registered Nurse

## 2022-12-14 DIAGNOSIS — R1011 Right upper quadrant pain: Secondary | ICD-10-CM

## 2023-07-30 ENCOUNTER — Inpatient Hospital Stay (HOSPITAL_COMMUNITY)
Admission: EM | Admit: 2023-07-30 | Discharge: 2023-07-31 | DRG: 271 | Disposition: A | Discharge status: Home / Self Care | Payer: Commercial Managed Care - HMO | Attending: Vascular Surgery | Admitting: Vascular Surgery

## 2023-07-30 ENCOUNTER — Encounter (HOSPITAL_COMMUNITY): Payer: Self-pay | Admitting: Emergency Medicine

## 2023-07-30 ENCOUNTER — Emergency Department (HOSPITAL_COMMUNITY): Payer: Commercial Managed Care - HMO

## 2023-07-30 ENCOUNTER — Other Ambulatory Visit: Payer: Self-pay

## 2023-07-30 ENCOUNTER — Encounter (HOSPITAL_COMMUNITY)
Admission: EM | Disposition: A | Discharge status: Home / Self Care | Payer: Self-pay | Source: Home / Self Care | Attending: Vascular Surgery

## 2023-07-30 DIAGNOSIS — I723 Aneurysm of iliac artery: Secondary | ICD-10-CM

## 2023-07-30 DIAGNOSIS — I2510 Atherosclerotic heart disease of native coronary artery without angina pectoris: Secondary | ICD-10-CM | POA: Diagnosis present

## 2023-07-30 DIAGNOSIS — Z91013 Allergy to seafood: Secondary | ICD-10-CM

## 2023-07-30 DIAGNOSIS — E1151 Type 2 diabetes mellitus with diabetic peripheral angiopathy without gangrene: Secondary | ICD-10-CM | POA: Diagnosis present

## 2023-07-30 DIAGNOSIS — Z8679 Personal history of other diseases of the circulatory system: Secondary | ICD-10-CM

## 2023-07-30 DIAGNOSIS — I714 Abdominal aortic aneurysm, without rupture, unspecified: Secondary | ICD-10-CM | POA: Diagnosis present

## 2023-07-30 DIAGNOSIS — F17210 Nicotine dependence, cigarettes, uncomplicated: Secondary | ICD-10-CM | POA: Diagnosis present

## 2023-07-30 DIAGNOSIS — E781 Pure hyperglyceridemia: Secondary | ICD-10-CM | POA: Diagnosis present

## 2023-07-30 DIAGNOSIS — I10 Essential (primary) hypertension: Secondary | ICD-10-CM | POA: Diagnosis not present

## 2023-07-30 DIAGNOSIS — M19021 Primary osteoarthritis, right elbow: Secondary | ICD-10-CM | POA: Diagnosis present

## 2023-07-30 DIAGNOSIS — Z79899 Other long term (current) drug therapy: Secondary | ICD-10-CM | POA: Diagnosis not present

## 2023-07-30 DIAGNOSIS — Z7982 Long term (current) use of aspirin: Secondary | ICD-10-CM

## 2023-07-30 DIAGNOSIS — R1031 Right lower quadrant pain: Secondary | ICD-10-CM | POA: Diagnosis present

## 2023-07-30 DIAGNOSIS — K219 Gastro-esophageal reflux disease without esophagitis: Secondary | ICD-10-CM | POA: Diagnosis present

## 2023-07-30 DIAGNOSIS — E871 Hypo-osmolality and hyponatremia: Secondary | ICD-10-CM | POA: Diagnosis present

## 2023-07-30 HISTORY — PX: ABDOMINAL AORTIC ENDOVASCULAR STENT GRAFT: SHX5707

## 2023-07-30 HISTORY — PX: ULTRASOUND GUIDANCE FOR VASCULAR ACCESS: SHX6516

## 2023-07-30 LAB — APTT
aPTT: 37 s — ABNORMAL HIGH (ref 24–36)
aPTT: 39 s — ABNORMAL HIGH (ref 24–36)

## 2023-07-30 LAB — CBC WITH DIFFERENTIAL/PLATELET
Abs Immature Granulocytes: 0.03 10*3/uL (ref 0.00–0.07)
Basophils Absolute: 0 10*3/uL (ref 0.0–0.1)
Basophils Relative: 0 %
Eosinophils Absolute: 0.1 10*3/uL (ref 0.0–0.5)
Eosinophils Relative: 1 %
HCT: 49 % (ref 39.0–52.0)
Hemoglobin: 16.7 g/dL (ref 13.0–17.0)
Immature Granulocytes: 0 %
Lymphocytes Relative: 37 %
Lymphs Abs: 3.7 10*3/uL (ref 0.7–4.0)
MCH: 28.3 pg (ref 26.0–34.0)
MCHC: 34.1 g/dL (ref 30.0–36.0)
MCV: 83.1 fL (ref 80.0–100.0)
Monocytes Absolute: 0.8 10*3/uL (ref 0.1–1.0)
Monocytes Relative: 8 %
Neutro Abs: 5.3 10*3/uL (ref 1.7–7.7)
Neutrophils Relative %: 54 %
Platelets: 323 10*3/uL (ref 150–400)
RBC: 5.9 MIL/uL — ABNORMAL HIGH (ref 4.22–5.81)
RDW: 13.2 % (ref 11.5–15.5)
WBC: 9.9 10*3/uL (ref 4.0–10.5)
nRBC: 0 % (ref 0.0–0.2)

## 2023-07-30 LAB — COMPREHENSIVE METABOLIC PANEL
ALT: 39 U/L (ref 0–44)
AST: 42 U/L — ABNORMAL HIGH (ref 15–41)
Albumin: 4.2 g/dL (ref 3.5–5.0)
Alkaline Phosphatase: 44 U/L (ref 38–126)
Anion gap: 14 (ref 5–15)
BUN: 8 mg/dL (ref 6–20)
CO2: 21 mmol/L — ABNORMAL LOW (ref 22–32)
Calcium: 9.5 mg/dL (ref 8.9–10.3)
Chloride: 98 mmol/L (ref 98–111)
Creatinine, Ser: 1.04 mg/dL (ref 0.61–1.24)
GFR, Estimated: >60 mL/min (ref >60)
Glucose, Bld: 133 mg/dL — ABNORMAL HIGH (ref 70–99)
Potassium: 4.1 mmol/L (ref 3.5–5.1)
Sodium: 133 mmol/L — ABNORMAL LOW (ref 135–145)
Total Bilirubin: 1 mg/dL (ref 0.3–1.2)
Total Protein: 7.8 g/dL (ref 6.5–8.1)

## 2023-07-30 LAB — CBC
HCT: 40.7 % (ref 39.0–52.0)
HCT: 39.1 % (ref 39.0–52.0)
Hemoglobin: 13.8 g/dL (ref 13.0–17.0)
Hemoglobin: 13.5 g/dL (ref 13.0–17.0)
MCH: 28.4 pg (ref 26.0–34.0)
MCH: 28.4 pg (ref 26.0–34.0)
MCHC: 33.9 g/dL (ref 30.0–36.0)
MCHC: 34.5 g/dL (ref 30.0–36.0)
MCV: 83.7 fL (ref 80.0–100.0)
MCV: 82.1 fL (ref 80.0–100.0)
Platelets: 261 10*3/uL (ref 150–400)
Platelets: 259 10*3/uL (ref 150–400)
RBC: 4.86 MIL/uL (ref 4.22–5.81)
RBC: 4.76 MIL/uL (ref 4.22–5.81)
RDW: 13.2 % (ref 11.5–15.5)
RDW: 13.1 % (ref 11.5–15.5)
WBC: 11.2 10*3/uL — ABNORMAL HIGH (ref 4.0–10.5)
WBC: 11.1 10*3/uL — ABNORMAL HIGH (ref 4.0–10.5)
nRBC: 0 % (ref 0.0–0.2)
nRBC: 0 % (ref 0.0–0.2)

## 2023-07-30 LAB — BASIC METABOLIC PANEL
Anion gap: 7 (ref 5–15)
BUN: 7 mg/dL (ref 6–20)
CO2: 21 mmol/L — ABNORMAL LOW (ref 22–32)
Calcium: 8.4 mg/dL — ABNORMAL LOW (ref 8.9–10.3)
Chloride: 101 mmol/L (ref 98–111)
Creatinine, Ser: 0.86 mg/dL (ref 0.61–1.24)
GFR, Estimated: >60 mL/min (ref >60)
Glucose, Bld: 142 mg/dL — ABNORMAL HIGH (ref 70–99)
Potassium: 4.2 mmol/L (ref 3.5–5.1)
Sodium: 129 mmol/L — ABNORMAL LOW (ref 135–145)

## 2023-07-30 LAB — URINALYSIS, ROUTINE W REFLEX MICROSCOPIC
Bilirubin Urine: NEGATIVE
Glucose, UA: NEGATIVE mg/dL
Hgb urine dipstick: NEGATIVE
Ketones, ur: NEGATIVE mg/dL
Leukocytes,Ua: NEGATIVE
Nitrite: NEGATIVE
Protein, ur: NEGATIVE mg/dL
Specific Gravity, Urine: 1.012 (ref 1.005–1.030)
pH: 5 (ref 5.0–8.0)

## 2023-07-30 LAB — HEMOGLOBIN A1C
Hgb A1c MFr Bld: 6.3 % — ABNORMAL HIGH (ref 4.8–5.6)
Mean Plasma Glucose: 134.11 mg/dL

## 2023-07-30 LAB — MRSA NEXT GEN BY PCR, NASAL: MRSA by PCR Next Gen: NOT DETECTED

## 2023-07-30 LAB — PREPARE RBC (CROSSMATCH)

## 2023-07-30 LAB — CREATININE, SERUM
Creatinine, Ser: 0.76 mg/dL (ref 0.61–1.24)
GFR, Estimated: >60 mL/min (ref >60)

## 2023-07-30 LAB — PROTIME-INR
INR: 1.1 (ref 0.8–1.2)
INR: 1.3 — ABNORMAL HIGH (ref 0.8–1.2)
Prothrombin Time: 14.8 s (ref 11.4–15.2)
Prothrombin Time: 16.2 s — ABNORMAL HIGH (ref 11.4–15.2)

## 2023-07-30 LAB — HEMOGLOBIN AND HEMATOCRIT, BLOOD
HCT: 45.6 % (ref 39.0–52.0)
Hemoglobin: 15.6 g/dL (ref 13.0–17.0)

## 2023-07-30 LAB — MAGNESIUM: Magnesium: 1.7 mg/dL (ref 1.7–2.4)

## 2023-07-30 LAB — GLUCOSE, CAPILLARY
Glucose-Capillary: 127 mg/dL — ABNORMAL HIGH (ref 70–99)
Glucose-Capillary: 116 mg/dL — ABNORMAL HIGH (ref 70–99)

## 2023-07-30 LAB — LIPASE, BLOOD: Lipase: 20 U/L (ref 11–51)

## 2023-07-30 LAB — ABO/RH: ABO/RH(D): A POS

## 2023-07-30 SURGERY — INSERTION, ENDOVASCULAR STENT GRAFT, AORTA, ABDOMINAL
Anesthesia: General | Site: Groin

## 2023-07-30 MED ORDER — LIDOCAINE 2% (20 MG/ML) 5 ML SYRINGE
INTRAMUSCULAR | Status: DC | PRN
  Administered 2023-07-30: 100 mg via INTRAVENOUS

## 2023-07-30 MED ORDER — NOREPINEPHRINE 4 MG/250ML-% IV SOLN
INTRAVENOUS | Status: DC | PRN
Start: 2023-07-30 — End: 2023-07-30
  Administered 2023-07-30: 5 ug/min via INTRAVENOUS

## 2023-07-30 MED ORDER — CEFAZOLIN SODIUM-DEXTROSE 2-3 GM-%(50ML) IV SOLR
INTRAVENOUS | Status: DC | PRN
Start: 2023-07-30 — End: 2023-07-30
  Administered 2023-07-30: 2 g via INTRAVENOUS

## 2023-07-30 MED ORDER — PHENYLEPHRINE 80 MCG/ML (10ML) SYRINGE FOR IV PUSH (FOR BLOOD PRESSURE SUPPORT)
PREFILLED_SYRINGE | INTRAVENOUS | Status: DC | PRN
  Administered 2023-07-30 (×3): 80 ug via INTRAVENOUS

## 2023-07-30 MED ORDER — IODIXANOL 320 MG/ML IV SOLN
INTRAVENOUS | Status: DC | PRN
  Administered 2023-07-30: 107 mL via INTRA_ARTERIAL

## 2023-07-30 MED ORDER — ONDANSETRON HCL 4 MG/2ML IJ SOLN
INTRAMUSCULAR | Status: AC
  Filled 2023-07-30: qty 2

## 2023-07-30 MED ORDER — HEPARIN SODIUM (PORCINE) 5000 UNIT/ML IJ SOLN
5000.0000 [IU] | Freq: Three times a day (TID) | INTRAMUSCULAR | Status: DC
  Administered 2023-07-31: 5000 [IU] via SUBCUTANEOUS
  Filled 2023-07-30: qty 1

## 2023-07-30 MED ORDER — MIDAZOLAM HCL 2 MG/2ML IJ SOLN
INTRAMUSCULAR | Status: DC | PRN
  Administered 2023-07-30: 2 mg via INTRAVENOUS

## 2023-07-30 MED ORDER — ALUM & MAG HYDROXIDE-SIMETH 200-200-20 MG/5ML PO SUSP: 15.0000 mL | ORAL | Status: DC | PRN

## 2023-07-30 MED ORDER — OXYCODONE HCL 5 MG/5ML PO SOLN: 5.0000 mg | Freq: Once | ORAL | Status: DC | PRN

## 2023-07-30 MED ORDER — PHENYLEPHRINE HCL-NACL 20-0.9 MG/250ML-% IV SOLN
INTRAVENOUS | Status: DC | PRN
Start: 2023-07-30 — End: 2023-07-30
  Administered 2023-07-30: 50 ug/min via INTRAVENOUS

## 2023-07-30 MED ORDER — FENOFIBRATE 160 MG PO TABS
160.0000 mg | ORAL_TABLET | Freq: Every day | ORAL | Status: DC
  Administered 2023-07-30 – 2023-07-31 (×2): 160 mg via ORAL
  Filled 2023-07-30 (×2): qty 1

## 2023-07-30 MED ORDER — HYDROMORPHONE HCL 1 MG/ML IJ SOLN: 0.2500 mg | INTRAMUSCULAR | Status: DC | PRN

## 2023-07-30 MED ORDER — HYDROMORPHONE HCL 1 MG/ML IJ SOLN
INTRAMUSCULAR | Status: AC
  Administered 2023-07-30: 0.5 mg via INTRAVENOUS
  Filled 2023-07-30: qty 1

## 2023-07-30 MED ORDER — HEPARIN 6000 UNIT IRRIGATION SOLUTION
Status: DC | PRN
  Administered 2023-07-30: 1

## 2023-07-30 MED ORDER — PANTOPRAZOLE SODIUM 40 MG PO TBEC
40.0000 mg | DELAYED_RELEASE_TABLET | Freq: Every day | ORAL | Status: DC
  Administered 2023-07-31: 40 mg via ORAL
  Filled 2023-07-30: qty 1

## 2023-07-30 MED ORDER — ONDANSETRON HCL 4 MG/2ML IJ SOLN
INTRAMUSCULAR | Status: DC | PRN
  Administered 2023-07-30: 4 mg via INTRAVENOUS

## 2023-07-30 MED ORDER — ROSUVASTATIN CALCIUM 5 MG PO TABS
10.0000 mg | ORAL_TABLET | Freq: Every day | ORAL | Status: DC
  Administered 2023-07-30: 10 mg via ORAL
  Filled 2023-07-30: qty 2

## 2023-07-30 MED ORDER — DROPERIDOL 2.5 MG/ML IJ SOLN: 0.6250 mg | Freq: Once | INTRAMUSCULAR | Status: DC | PRN

## 2023-07-30 MED ORDER — ORAL CARE MOUTH RINSE: 15.0000 mL | OROMUCOSAL | Status: DC | PRN

## 2023-07-30 MED ORDER — ACETAMINOPHEN 325 MG PO TABS: 325.0000 mg | ORAL_TABLET | ORAL | Status: DC | PRN

## 2023-07-30 MED ORDER — INSULIN ASPART 100 UNIT/ML IJ SOLN
0.0000 [IU] | Freq: Three times a day (TID) | INTRAMUSCULAR | Status: DC
  Administered 2023-07-30: 2 [IU] via SUBCUTANEOUS

## 2023-07-30 MED ORDER — FENTANYL CITRATE (PF) 250 MCG/5ML IJ SOLN
INTRAMUSCULAR | Status: AC
  Filled 2023-07-30: qty 5

## 2023-07-30 MED ORDER — DOCUSATE SODIUM 100 MG PO CAPS
100.0000 mg | ORAL_CAPSULE | Freq: Every day | ORAL | Status: DC
  Administered 2023-07-31: 100 mg via ORAL
  Filled 2023-07-30: qty 1

## 2023-07-30 MED ORDER — ROCURONIUM BROMIDE 10 MG/ML (PF) SYRINGE
PREFILLED_SYRINGE | INTRAVENOUS | Status: AC
  Filled 2023-07-30: qty 10

## 2023-07-30 MED ORDER — PROPOFOL 10 MG/ML IV BOLUS
INTRAVENOUS | Status: AC
  Filled 2023-07-30: qty 20

## 2023-07-30 MED ORDER — LACTATED RINGERS IV SOLN
INTRAVENOUS | Status: DC | PRN
Start: 2023-07-30 — End: 2023-07-30

## 2023-07-30 MED ORDER — POTASSIUM CHLORIDE CRYS ER 20 MEQ PO TBCR
20.0000 meq | EXTENDED_RELEASE_TABLET | Freq: Every day | ORAL | Status: AC | PRN
  Administered 2023-07-31: 20 meq via ORAL
  Filled 2023-07-30: qty 1

## 2023-07-30 MED ORDER — HYDROMORPHONE HCL 1 MG/ML IJ SOLN
1.0000 mg | Freq: Once | INTRAMUSCULAR | Status: AC
  Administered 2023-07-30: 1 mg via INTRAVENOUS
  Filled 2023-07-30: qty 1

## 2023-07-30 MED ORDER — ESMOLOL BOLUS VIA INFUSION
500.0000 ug/kg | Freq: Once | INTRAVENOUS | Status: AC
  Administered 2023-07-30: 20000 ug via INTRAVENOUS
  Filled 2023-07-30: qty 41000

## 2023-07-30 MED ORDER — HEPARIN 6000 UNIT IRRIGATION SOLUTION
Status: AC
  Filled 2023-07-30: qty 500

## 2023-07-30 MED ORDER — ASPIRIN 81 MG PO TBEC
81.0000 mg | DELAYED_RELEASE_TABLET | Freq: Every day | ORAL | Status: DC
  Administered 2023-07-31: 81 mg via ORAL
  Filled 2023-07-30: qty 1

## 2023-07-30 MED ORDER — LABETALOL HCL 5 MG/ML IV SOLN
INTRAVENOUS | Status: AC
  Filled 2023-07-30: qty 4

## 2023-07-30 MED ORDER — SUGAMMADEX SODIUM 200 MG/2ML IV SOLN
INTRAVENOUS | Status: DC | PRN
  Administered 2023-07-30: 200 mg via INTRAVENOUS

## 2023-07-30 MED ORDER — NICOTINE 21 MG/24HR TD PT24
21.0000 mg | MEDICATED_PATCH | Freq: Every day | TRANSDERMAL | Status: DC
  Administered 2023-07-30 – 2023-07-31 (×2): 21 mg via TRANSDERMAL
  Filled 2023-07-30 (×2): qty 1

## 2023-07-30 MED ORDER — NOREPINEPHRINE BITARTRATE 1 MG/ML IV SOLN
INTRAVENOUS | Status: DC | PRN
  Administered 2023-07-30: 2 mL via INTRAVENOUS
  Administered 2023-07-30 (×2): 1 mL via INTRAVENOUS
  Administered 2023-07-30: 2 mL via INTRAVENOUS
  Administered 2023-07-30 (×2): 1 mL via INTRAVENOUS

## 2023-07-30 MED ORDER — ESMOLOL HCL-SODIUM CHLORIDE 2000 MG/100ML IV SOLN
25.0000 ug/kg/min | INTRAVENOUS | Status: DC
  Administered 2023-07-30: 25 ug/kg/min via INTRAVENOUS
  Filled 2023-07-30 (×3): qty 100

## 2023-07-30 MED ORDER — LIDOCAINE 2% (20 MG/ML) 5 ML SYRINGE
INTRAMUSCULAR | Status: AC
  Filled 2023-07-30: qty 5

## 2023-07-30 MED ORDER — OXYCODONE HCL 5 MG PO TABS: 5.0000 mg | ORAL_TABLET | Freq: Once | ORAL | Status: DC | PRN

## 2023-07-30 MED ORDER — METOPROLOL TARTRATE 5 MG/5ML IV SOLN: 2.0000 mg | INTRAVENOUS | Status: DC | PRN

## 2023-07-30 MED ORDER — ACETAMINOPHEN 325 MG RE SUPP: 325.0000 mg | RECTAL | Status: DC | PRN

## 2023-07-30 MED ORDER — HYDRALAZINE HCL 20 MG/ML IJ SOLN
INTRAMUSCULAR | Status: AC
  Administered 2023-07-30: 5 mg via INTRAVENOUS
  Filled 2023-07-30: qty 1

## 2023-07-30 MED ORDER — MORPHINE SULFATE (PF) 2 MG/ML IV SOLN
2.0000 mg | INTRAVENOUS | Status: DC | PRN
  Administered 2023-07-30 (×2): 2 mg via INTRAVENOUS
  Filled 2023-07-30 (×3): qty 1

## 2023-07-30 MED ORDER — IOHEXOL 350 MG/ML SOLN
75.0000 mL | Freq: Once | INTRAVENOUS | Status: AC | PRN
  Administered 2023-07-30: 75 mL via INTRAVENOUS

## 2023-07-30 MED ORDER — OXYCODONE-ACETAMINOPHEN 5-325 MG PO TABS
1.0000 | ORAL_TABLET | ORAL | Status: DC | PRN
  Administered 2023-07-30 – 2023-07-31 (×4): 2 via ORAL
  Filled 2023-07-30 (×4): qty 2

## 2023-07-30 MED ORDER — PHENOL 1.4 % MT LIQD: 1.0000 | OROMUCOSAL | Status: DC | PRN

## 2023-07-30 MED ORDER — CHLORHEXIDINE GLUCONATE CLOTH 2 % EX PADS
6.0000 | MEDICATED_PAD | Freq: Every day | CUTANEOUS | Status: DC
  Administered 2023-07-30 – 2023-07-31 (×2): 6 via TOPICAL

## 2023-07-30 MED ORDER — HEPARIN SODIUM (PORCINE) 1000 UNIT/ML IJ SOLN
INTRAMUSCULAR | Status: AC
  Filled 2023-07-30: qty 10

## 2023-07-30 MED ORDER — CEFAZOLIN SODIUM-DEXTROSE 2-4 GM/100ML-% IV SOLN
2.0000 g | Freq: Three times a day (TID) | INTRAVENOUS | Status: AC
  Administered 2023-07-30 – 2023-07-31 (×2): 2 g via INTRAVENOUS
  Filled 2023-07-30 (×2): qty 100

## 2023-07-30 MED ORDER — GUAIFENESIN-DM 100-10 MG/5ML PO SYRP: 15.0000 mL | ORAL_SOLUTION | ORAL | Status: DC | PRN

## 2023-07-30 MED ORDER — LABETALOL HCL 5 MG/ML IV SOLN
INTRAVENOUS | Status: DC | PRN
Start: 2023-07-30 — End: 2023-07-30
  Administered 2023-07-30 (×2): 5 mg via INTRAVENOUS

## 2023-07-30 MED ORDER — HYDRALAZINE HCL 20 MG/ML IJ SOLN
5.0000 mg | INTRAMUSCULAR | Status: AC | PRN
  Administered 2023-07-30: 5 mg via INTRAVENOUS
  Filled 2023-07-30: qty 1

## 2023-07-30 MED ORDER — PROPOFOL 10 MG/ML IV BOLUS
INTRAVENOUS | Status: DC | PRN
  Administered 2023-07-30: 50 mg via INTRAVENOUS

## 2023-07-30 MED ORDER — LABETALOL HCL 5 MG/ML IV SOLN
10.0000 mg | INTRAVENOUS | Status: AC | PRN
  Administered 2023-07-30: 5 mg via INTRAVENOUS
  Administered 2023-07-30 (×2): 10 mg via INTRAVENOUS
  Filled 2023-07-30: qty 4

## 2023-07-30 MED ORDER — POLYETHYLENE GLYCOL 3350 17 G PO PACK: 17.0000 g | PACK | Freq: Every day | ORAL | Status: DC | PRN

## 2023-07-30 MED ORDER — LABETALOL HCL 5 MG/ML IV SOLN
INTRAVENOUS | Status: AC
  Administered 2023-07-30: 5 mg via INTRAVENOUS
  Filled 2023-07-30: qty 4

## 2023-07-30 MED ORDER — SODIUM CHLORIDE 0.9% IV SOLUTION: Freq: Once | INTRAVENOUS | Status: DC

## 2023-07-30 MED ORDER — AMLODIPINE BESYLATE 10 MG PO TABS
10.0000 mg | ORAL_TABLET | Freq: Every day | ORAL | Status: DC
  Administered 2023-07-30 – 2023-07-31 (×2): 10 mg via ORAL
  Filled 2023-07-30 (×2): qty 1

## 2023-07-30 MED ORDER — ROCURONIUM BROMIDE 10 MG/ML (PF) SYRINGE
PREFILLED_SYRINGE | INTRAVENOUS | Status: DC | PRN
  Administered 2023-07-30: 100 mg via INTRAVENOUS

## 2023-07-30 MED ORDER — POTASSIUM CHLORIDE CRYS ER 10 MEQ PO TBCR
10.0000 meq | EXTENDED_RELEASE_TABLET | Freq: Every day | ORAL | Status: DC
  Administered 2023-07-31: 10 meq via ORAL
  Filled 2023-07-30: qty 1

## 2023-07-30 MED ORDER — ACETAMINOPHEN 10 MG/ML IV SOLN: 1000.0000 mg | Freq: Once | INTRAVENOUS | Status: DC | PRN

## 2023-07-30 MED ORDER — PROTAMINE SULFATE 10 MG/ML IV SOLN
INTRAVENOUS | Status: AC
  Filled 2023-07-30: qty 5

## 2023-07-30 MED ORDER — ALBUMIN HUMAN 5 % IV SOLN: INTRAVENOUS | Status: DC | PRN

## 2023-07-30 MED ORDER — SODIUM CHLORIDE 0.9 % IV SOLN: 500.0000 mL | Freq: Once | INTRAVENOUS | Status: DC | PRN

## 2023-07-30 MED ORDER — MIDAZOLAM HCL 2 MG/2ML IJ SOLN
INTRAMUSCULAR | Status: AC
  Filled 2023-07-30: qty 2

## 2023-07-30 MED ORDER — PROTAMINE SULFATE 10 MG/ML IV SOLN
INTRAVENOUS | Status: DC | PRN
Start: 2023-07-30 — End: 2023-07-30
  Administered 2023-07-30: 50 mg via INTRAVENOUS

## 2023-07-30 MED ORDER — FENTANYL CITRATE (PF) 250 MCG/5ML IJ SOLN
INTRAMUSCULAR | Status: DC | PRN
  Administered 2023-07-30 (×2): 50 ug via INTRAVENOUS

## 2023-07-30 MED ORDER — MAGNESIUM SULFATE 2 GM/50ML IV SOLN: 2.0000 g | Freq: Every day | INTRAVENOUS | Status: DC | PRN

## 2023-07-30 MED ORDER — HYDROCHLOROTHIAZIDE 25 MG PO TABS
25.0000 mg | ORAL_TABLET | Freq: Every day | ORAL | Status: DC
  Administered 2023-07-30 – 2023-07-31 (×2): 25 mg via ORAL
  Filled 2023-07-30 (×2): qty 1

## 2023-07-30 MED ORDER — SODIUM CHLORIDE 0.9 % IV SOLN: INTRAVENOUS | Status: DC

## 2023-07-30 MED ORDER — ONDANSETRON HCL 4 MG/2ML IJ SOLN
4.0000 mg | Freq: Four times a day (QID) | INTRAMUSCULAR | Status: DC | PRN
  Administered 2023-07-30: 4 mg via INTRAVENOUS
  Filled 2023-07-30: qty 2

## 2023-07-30 SURGICAL SUPPLY — 47 items
ADH SKN CLS APL DERMABOND .7 (GAUZE/BANDAGES/DRESSINGS) ×4
BAG COUNTER SPONGE SURGICOUNT (BAG) ×2 IMPLANT
BAG SPNG CNTER NS LX DISP (BAG) ×2
CANISTER SUCT 3000ML PPV (MISCELLANEOUS) ×2 IMPLANT
CATH BEACON 5.038 65CM KMP-01 (CATHETERS) ×2 IMPLANT
CATH OMNI FLUSH .035X70CM (CATHETERS) ×2 IMPLANT
DERMABOND ADVANCED .7 DNX12 (GAUZE/BANDAGES/DRESSINGS) ×2 IMPLANT
DEVICE CLOSURE PERCLS PRGLD 6F (VASCULAR PRODUCTS) ×8 IMPLANT
DRSG TEGADERM 2-3/8X2-3/4 SM (GAUZE/BANDAGES/DRESSINGS) ×4 IMPLANT
ELECT REM PT RETURN 9FT ADLT (ELECTROSURGICAL) ×4 IMPLANT
ELECTRODE REM PT RTRN 9FT ADLT (ELECTROSURGICAL) ×4 IMPLANT
EXCLDR TRNK ENDO 23X14.5X12 15 (Endovascular Graft) ×2 IMPLANT
EXCLUDER TNK END 23X14.5X12 15 (Endovascular Graft) IMPLANT
GAUZE SPONGE 2X2 8PLY STRL LF (GAUZE/BANDAGES/DRESSINGS) ×2 IMPLANT
GLIDEWIRE ADV .035X180CM (WIRE) IMPLANT
GLOVE BIO SURGEON STRL SZ7.5 (GLOVE) ×2 IMPLANT
GLOVE BIOGEL PI IND STRL 8 (GLOVE) ×2 IMPLANT
GOWN STRL REUS W/ TWL LRG LVL3 (GOWN DISPOSABLE) ×6 IMPLANT
GOWN STRL REUS W/ TWL XL LVL3 (GOWN DISPOSABLE) ×2 IMPLANT
GOWN STRL REUS W/TWL LRG LVL3 (GOWN DISPOSABLE) ×6
GOWN STRL REUS W/TWL XL LVL3 (GOWN DISPOSABLE) ×2
GRAFT BALLN CATH 65CM (BALLOONS) ×2 IMPLANT
KIT BASIN OR (CUSTOM PROCEDURE TRAY) ×2 IMPLANT
KIT TURNOVER KIT B (KITS) ×2 IMPLANT
LEG CONTRALATERAL 16X18X9.5 (Endovascular Graft) IMPLANT
NS IRRIG 1000ML POUR BTL (IV SOLUTION) ×2 IMPLANT
PACK ENDOVASCULAR (PACKS) ×2 IMPLANT
PAD ARMBOARD 7.5X6 YLW CONV (MISCELLANEOUS) ×4 IMPLANT
PENCIL BUTTON HOLSTER BLD 10FT (ELECTRODE) IMPLANT
PERCLOSE PROGLIDE 6F (VASCULAR PRODUCTS) ×8 IMPLANT
SET MICROPUNCTURE 5F STIFF (MISCELLANEOUS) ×2 IMPLANT
SHEATH BRITE TIP 8FR 23CM (SHEATH) ×2 IMPLANT
SHEATH DESTINATION 8F 45CM (SHEATH) IMPLANT
SHEATH DRYSEAL FLEX 12FR 33CM (SHEATH) IMPLANT
SHEATH DRYSEAL FLEX 16FR 33CM (SHEATH) IMPLANT
SHEATH PINNACLE 8F 10CM (SHEATH) ×2 IMPLANT
STENT GRAFT CONTRALAT 16X12X14 (Vascular Products) IMPLANT
STOPCOCK MORSE 400PSI 3WAY (MISCELLANEOUS) ×2 IMPLANT
SUT MNCRL AB 4-0 PS2 18 (SUTURE) ×2 IMPLANT
SYR 20ML LL LF (SYRINGE) ×2 IMPLANT
TOWEL GREEN STERILE (TOWEL DISPOSABLE) ×2 IMPLANT
TRAY FOLEY MTR SLVR 16FR STAT (SET/KITS/TRAYS/PACK) ×2 IMPLANT
TUBE CONNECTING 12X1/4 (SUCTIONS) IMPLANT
TUBING HIGH PRESSURE 120CM (CONNECTOR) ×2 IMPLANT
WIRE AMPLATZ SS-J .035X180CM (WIRE) ×4 IMPLANT
WIRE BENTSON .035X145CM (WIRE) ×4 IMPLANT
WIRE LUNDERQUIST .035X180CM (WIRE) IMPLANT

## 2023-07-30 NOTE — Anesthesia Preprocedure Evaluation (Signed)
Anesthesia Evaluation  Patient identified by MRN, date of birth, ID band Patient awake    Reviewed: Allergy & Precautions, H&P , NPO status , Patient's Chart, lab work & pertinent test resultsPreop documentation limited or incomplete due to emergent nature of procedure.  Airway Mallampati: II       Dental no notable dental hx.    Pulmonary Current Smoker   Pulmonary exam normal        Cardiovascular hypertension, + CAD and + Peripheral Vascular Disease   Rhythm:Regular  ruptured right common iliac artery aneurysm   Neuro/Psych negative neurological ROS  negative psych ROS   GI/Hepatic Neg liver ROS,GERD  ,,  Endo/Other  negative endocrine ROSdiabetes    Renal/GU negative Renal ROS  negative genitourinary   Musculoskeletal  (+) Arthritis ,    Abdominal   Peds negative pediatric ROS (+)  Hematology negative hematology ROS (+)   Anesthesia Other Findings   Reproductive/Obstetrics negative OB ROS                              Anesthesia Physical Anesthesia Plan  ASA: 4 and emergent  Anesthesia Plan: General   Post-op Pain Management:    Induction: Intravenous  PONV Risk Score and Plan: Ondansetron and Dexamethasone  Airway Management Planned: Oral ETT  Additional Equipment: Arterial line  Intra-op Plan:   Post-operative Plan: Extubation in OR  Informed Consent: I have reviewed the patients History and Physical, chart, labs and discussed the procedure including the risks, benefits and alternatives for the proposed anesthesia with the patient or authorized representative who has indicated his/her understanding and acceptance.     Dental advisory given  Plan Discussed with: CRNA  Anesthesia Plan Comments: (This is a 57 y.o. male with PMH of HTN, DM, HLD, CAD, tobacco abuse, pancreatitis and PAD with hx of left fem- popliteal bypass who presented with RLQ pain yesterday that  has progressed overnight. CTA abdomen pelvis with ruptured right common iliac artery aneurysm. He will need to be emergently taken to the OR for endovascular stent graft repair. )         Anesthesia Quick Evaluation

## 2023-07-30 NOTE — ED Notes (Signed)
Per pharmacist that was bedside, patient only received 20,000 mcg bolus of esmolol due to patient having been on infusion and titrated up prior to bolus being ordered and reviewed by pharmacist, and further delayed by meds having to come from main pharmacy. CT staff advised that CT was not completed correctly on initial scan, so we returned to CT to finish scan. While en route to CT, I received call from OR that advised they were ready for patient. I advised them we were headed to CT then headed up. In CT, patient went unresponsive for 5-10 minutes, GCS 3. HR noted to be in 50s, but with palpable carotid pulse. He was hypotensive with SBP in the 90s, and HR remained in 50s. Esmolol titrated down and CT staff went and had Dr. Karene Fry come bedside. Patient became more alert to pain by the time Dr was bedside. He never had a loss of respiratory drive, and maintained a palpable pulse. Once BP returned to normal level, scan restarted and patient transported to OR> Report given face to face to OR staff.

## 2023-07-30 NOTE — Op Note (Signed)
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  Ultrasound-guided access bilateral common femoral arteries for delivery of endograft with percutaneous closure 2.  Repair of ruptured right common iliac artery aneurysm with aortobiiliac stent graft (23 mm x 14 mm 12 cm main body left, right iliac limb into the external iliac artery with a 12 mm x 14 cm, left iliac limb into the common iliac artery with an 18 mm x 10 cm)  Surgeon: Dr. Cephus Shelling, MD  Assistant: Dr. Carolynn Sayers, MD  Indications: 57 year old male who presented to the ED with acute abdominal pain and CT showed a ruptured right common iliac artery aneurysm that measured approximately 4.3 cm in diameter.  Patient was taken emergently to the operating room for stent graft repair after risks benefits discussed.  An assistant was needed given the complexity of the case and for the emergent nature and for wire and catheter exchange.  Findings: Ultrasound-guided access bilateral common femoral arteries with delivery of Perclose closure.  Initial plan was to coil the right hypogastric but further imaging showed the right hypogastric was occluded.  We then put a 12 Gore dry seal in the right common femoral artery and a 16 Gore dry seal in the left common femoral artery.  The main body was a 23 mm x 14 mm x 12 cm delivered up the left.  After we cannulated the gate we then deployed a 12 mm x 14 cm limb into the right external iliac artery to get seal distally.  On the left we extended into the common iliac artery with an 18 mm x 10 cm.  The left hypogastric was also occluded although we did preserve the bifurcation.  Widely patent stent graft at completion.  No filling of the aneurysm sac.  Anesthesia: General  Details: Patient was taken to the operating room emergently.  Placed on operative table in the supine position.  General  endotracheal anesthesia induced.  The abdominal wall and both groins were prepped and draped in standard sterile fashion.  Antibiotics were given and timeout performed.  Initially evaluated the right common femoral artery with ultrasound, it was patent and image was saved.  This was accessed with micro access needle and placed a microwire and then I made a stab incision and then dissected down with a hemostat.  I then placed a Bentson wire and used an 8 [place] dilator and deployed Perclose closure devices at 10:00 and 2:00 and placed an 8 [place] sheath.  We then did the exact same steps in the left groin by Dr. Hetty Blend again placing Perclose devices at 10:00 and 2:00 and then an 8 French sheath.  Then went up with our Omni Flush catheter and got dedicated imaging of the right iliac artery with plan for coiling of the right hypogastric.  The right hypogastric was occluded with no filling of the right hypogastric.  I did put a longer sheath of the right side to get a retrograde sheath shot also showed no filling of the right hypogastric.  We then made a plan to extend the stent graft in the right external iliac to get seal and treat the ruptured aneurysm.  We then exchanged for stiff wires in both groins and placed 12 French sheath in the right groin and a 16 French sheath in the left groin placed into the abdominal aorta.  Patient was given 100 units/kg IV heparin.  ACT  was checked to maintain greater than 250.  The main body device was delivered up the left was a 23 mm x 14 mm x 12 cm and we got a abdominal angiogram to identify the lowest left renal and the main body device was deployed below the renal down to the gate.  We then came from the right groin with a buddy wire using a Glidewire advantage and a KMP and cannulated the gate.  We then spun the catheter in the main body the endograft to confirm we were in the true lumen.  We then got retrograde imaging to define a good endpoint in the external iliac artery  to ensure we had good seal distally and deployed a 12 mm x 14 cm limb from the main body of endograft into the right external iliac artery.  We then came from the left and again get a retrograde sheath shot and that showed that the hypogastric was occluded on the left as well.  We elected to bell bottom into the left common iliac artery to preserve hypogastric bifurcation just in case.  We used an 18 mm x 10 cm that was deployed into the left common iliac artery with overlap in the main body device.  The proximal distal landing zones were then treated with MOB balloon.  Final abdominal angiogram showed widely patent stent graft with no filling of the aneurysm.  We then initially removed the right groin 12 French sheath and tied down the Perclose devices with good hemostasis.  I did put a micro sheath and got a retrograde sheath shot on the right showing a widely patent common femoral after deployment of the closure device.  We then did the same on the left tied down the Perclose devices with good hemostasis after removing the 16 French sheath.  We had signals in both feet.  Protamine was given for reversal.  Complication: None  Condition: Stable  Cephus Shelling, MD [facility] Office: [phone]   Cephus Shelling

## 2023-07-30 NOTE — ED Triage Notes (Signed)
Pt reports R sided flank pain that radiates into hip. Started last night and getting worse. Denies hx of kidney stones or urinary symptoms. Reports he has hx of pancreatitis but this does not feel that way. Also nauseated. Took 2 tylenol this evening.

## 2023-07-30 NOTE — ED Provider Notes (Signed)
[place] EMERGENCY DEPARTMENT AT [facility] Provider Note   CSN: [id number] Arrival date & time: 07/30/23  0401     History  Chief Complaint  Patient presents with   Abdominal Pain    Jordan [name] is a 57 y.o. male with PMHx CAD, DM, HTN, GERD, HLD, PAD, pancreatitis who presents to ED concerned for RLQ pain since yesterday afternoon. Last BM yesterday. No abdominal surgical hx. No hx of kidney stones. Pain does not radiate into testicles.  Denies fever, chest pain, dyspnea, cough, vomiting, diarrhea, dysuria, hematuria, hematochezia.    Abdominal Pain      Home Medications Prior to Admission medications   Medication Sig Start Date End Date Taking? Authorizing Provider  acetaminophen (TYLENOL) 325 MG tablet Take by mouth.    [provider]  amLODipine (NORVASC) 10 MG tablet Take by mouth. 11/13/19   [provider]  aspirin EC 81 MG EC tablet Take 1 tablet (81 mg total) by mouth daily. 12/20/18   Santos-Sanchez, Chelsea Primus, MD  Blood Glucose Monitoring Suppl (GLUCOCOM BLOOD GLUCOSE MONITOR) DEVI Use meter to check blood sugar twice times a day.  Please dispense based on availability, insurance coverage, and patient preference. 11/12/19   [provider]  fenofibrate (TRICOR) 145 MG tablet Take 1 tablet (145 mg total) by mouth daily. 01/06/19   Santos-Sanchez, Chelsea Primus, MD  glucose blood (PRECISION QID TEST) test strip Use 1 strip to check blood sugar 2 times a day. Please dispense based on availability, insurance coverage, and patient preference. 11/12/19   [provider]  hydrochlorothiazide (HYDRODIURIL) 25 MG tablet Take 1 tablet (25 mg total) by mouth daily. 12/20/21 03/20/22  Nahser, Deloris Ping, MD  potassium chloride (KLOR-CON) 10 MEQ tablet Take 1 tablet (10 mEq total) by mouth daily. 12/20/21 03/20/22  Nahser, Deloris Ping, MD  rosuvastatin (CRESTOR) 10 MG tablet Take 10 mg by mouth at bedtime. 05/21/20   [provider]  sildenafil  (VIAGRA) 50 MG tablet Take by mouth. 11/07/19   [provider]      Allergies    Fish allergy    Review of Systems   Review of Systems  Gastrointestinal:  Positive for abdominal pain.    Physical Exam Updated Vital Signs BP (!) 166/153   Pulse 75   Temp (!) 97.5 F (36.4 C) (Oral)   Resp 13   Ht 5\' 5"  (1.651 m)   Wt 81.6 kg   SpO2 94%   BMI 29.95 kg/m  Physical Exam Vitals and nursing note reviewed.  Constitutional:      General: He is not in acute distress.    Appearance: He is not ill-appearing or toxic-appearing.  HENT:     Head: Normocephalic and atraumatic.     Mouth/Throat:     Mouth: Mucous membranes are moist.     Pharynx: No oropharyngeal exudate or posterior oropharyngeal erythema.  Eyes:     General: No scleral icterus.       Right eye: No discharge.        Left eye: No discharge.     Conjunctiva/sclera: Conjunctivae normal.  Cardiovascular:     Rate and Rhythm: Normal rate and regular rhythm.     Pulses: Normal pulses.     Heart sounds: Normal heart sounds. No murmur heard.    Comments: +2 pedal pulses. Pulmonary:     Effort: Pulmonary effort is normal.  Abdominal:     General: Abdomen is flat. There is no distension.  Comments: Tenderness to palpation of RLQ.  Musculoskeletal:     Right lower leg: No edema.     Left lower leg: No edema.  Skin:    General: Skin is warm and dry.     Findings: No rash.  Neurological:     General: No focal deficit present.     Mental Status: He is alert. Mental status is at baseline.  Psychiatric:        Mood and Affect: Mood normal.        Behavior: Behavior normal.     ED Results / Procedures / Treatments   Labs (all labs ordered are listed, but only abnormal results are displayed) Labs Reviewed  COMPREHENSIVE METABOLIC PANEL - Abnormal; Notable for the following components:      Result Value   Sodium 133 (*)    CO2 21 (*)    Glucose, Bld 133 (*)    AST 42 (*)    All other components  within normal limits  CBC WITH DIFFERENTIAL/PLATELET - Abnormal; Notable for the following components:   RBC 5.90 (*)    All other components within normal limits  LIPASE, BLOOD  URINALYSIS, ROUTINE W REFLEX MICROSCOPIC  HEMOGLOBIN AND HEMATOCRIT, BLOOD  PROTIME-INR  APTT  TYPE AND SCREEN  ABO/RH    EKG EKG Interpretation Date/Time:  Monday July 30 2023 10:51:28 EDT Ventricular Rate:  76 PR Interval:  161 QRS Duration:  94 QT Interval:  480 QTC Calculation: 540 R Axis:   73  Text Interpretation: Sinus rhythm LAE, consider biatrial enlargement Left ventricular hypertrophy Prolonged QT interval Confirmed by Ernie Avena ([id number]) on 07/30/2023 11:21:44 AM  Radiology HYBRID OR IMAGING ([facility] ONLY)  Result Date: 07/30/2023 There is no interpretation for this exam.  This order is for images obtained during a surgical procedure.  Please See "Surgeries" Tab for more information regarding the procedure.   CT ABDOMEN PELVIS W CONTRAST  Result Date: 07/30/2023 CLINICAL DATA:  Right lower quadrant pain EXAM: CT ABDOMEN AND PELVIS WITH CONTRAST TECHNIQUE: Multidetector CT imaging of the abdomen and pelvis was performed using the standard protocol following bolus administration of intravenous contrast. RADIATION DOSE REDUCTION: This exam was performed according to the departmental dose-optimization program which includes automated exposure control, adjustment of the mA and/or kV according to patient size and/or use of iterative reconstruction technique. CONTRAST:  75mL OMNIPAQUE IOHEXOL 350 MG/ML SOLN COMPARISON:  CT 10/06/2022 FINDINGS: Lower chest: There is some linear opacity seen along bases likely scar or atelectasis. No pleural effusion. Hepatobiliary: Fatty liver infiltration. No space-occupying liver lesion. Some fatty sparing along the gallbladder fossa margin. Gallbladder is nondilated. Patent portal vein. Pancreas: Unremarkable. No pancreatic ductal dilatation or surrounding inflammatory  changes. Spleen: Normal in size without focal abnormality. Adrenals/Urinary Tract: Adrenal glands are unremarkable. Kidneys are normal, without renal calculi, focal lesion, or hydronephrosis. Bladder is unremarkable. Stomach/Bowel: On this non oral contrast exam, the large bowel is of normal course and caliber with scattered colonic stool. Left-sided colonic diverticulosis. Stomach and small bowel are nondilated. Normal appendix extends medial to the cecum in the right lower quadrant. Vascular/Lymphatic: Scattered vascular calcifications are identified. Once again there is a right common iliac aneurysm which previously measured 3.8 x 3.6 cm and today 4.7 by 4.3 cm with irregular margins. There is higher density seen along the thrombus within the aneurysm and surrounding hematoma tracking along the right pelvic sidewall, right psoas muscle, and in the presacral space consistent with a rupturing aneurysm. There is also mass  effect along surrounding structures with poor enhancement of the right common iliac vein. Once again there is also some dilatation of the left common femoral artery measuring up to 2.0 cm. The abdominal aorta has focal fusiform ectasia measuring up to 2.9 cm. No specific abnormal lymph node enlargement identified. Reproductive: Prostate is unremarkable. Other: Small bilateral inguinal hernias identified involving loops of bowel on the right side. No signs of obstruction. No free air. Musculoskeletal: Mild degenerative changes along the spine. Bridging osteophytes along the right sacroiliac joint. Sebaceous cyst in the midline posteriorly along the lower thorax. Unchanged from previous. Critical Value/emergent results were called by telephone at the time of interpretation on 07/30/2023 at 7:12 am to provider [facility] , who verbally acknowledged these results. IMPRESSION: Increasing size right common iliac artery aneurysm with acute hemorrhage and rupture. Significant hematoma in the  surrounding soft tissues, extending into the presacral space and involving the right psoas muscle. 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 No bowel obstruction. Left-sided colonic diverticulosis. Normal appendix. Right-sided inguinal hernia with involvement of loops of bowel. No obstruction. Fatty liver infiltration. Electronically Signed   By: Karen Kays M.D.   On: 07/30/2023 10:22    Procedures Procedures    Medications Ordered in ED Medications  ondansetron (ZOFRAN) injection 4 mg ( Intravenous MAR Hold 07/30/23 1145)  esmolol (BREVIBLOC) 2000 mg / 100 mL (20 mg/mL) infusion (125 mcg/kg/min  81.6 kg Intravenous Infusion Verify 07/30/23 1126)  heparin 6000 units / NS 500 mL irrigation (1 Application Irrigation Given 07/30/23 1138)  HYDROmorphone (DILAUDID) injection 1 mg (1 mg Intravenous Given 07/30/23 0812)  iohexol (OMNIPAQUE) 350 MG/ML injection 75 mL (75 mLs Intravenous Contrast Given 07/30/23 0939)  esmolol (BREVIBLOC) bolus via infusion 40,800 mcg (40,800 mcg Intravenous Bolus from Bag 07/30/23 1116)  HYDROmorphone (DILAUDID) injection 1 mg (1 mg Intravenous Given 07/30/23 1054)    ED Course/ Medical Decision Making/ A&P                                 Medical Decision Making Amount and/or Complexity of Data Reviewed Labs: ordered. Radiology: ordered.  Risk Prescription drug management.     This patient presents to the ED for concern of abdominal pain, this involves an extensive number of treatment options, and is a complaint that carries with it a high risk of complications and morbidity.  The differential diagnosis includes gastroenteritis, colitis, small bowel obstruction, appendicitis, cholecystitis, pancreatitis, nephrolithiasis, UTI, pyleonephritis, testicular torsion.   Co morbidities that complicate the patient evaluation  CAD, DM,  HTN, GERD, HLD, PAD, pancreatitis   Lab Tests:  I Ordered, and personally interpreted labs.  The pertinent results include: CBC with differential: No concern for anemia or leukocytosis CMP: hyponatremia at 133; BUN/Cr reassuring; HFP reassuring Lipase: within normal limits UA: not concerning for infection   Imaging Studies ordered:  I ordered imaging studies including   -CT Abd/Pelvis with contrast: evaluate for structural/surgical etiology of patients' severe abdominal pain.  I independently visualized and interpreted imaging I agree with the radiologist interpretation    Consultations Obtained:  I requested consultation with Vascular Surgeon Dr. Chestine Spore,  and discussed lab and imaging findings as well as pertinent plan - they agree to quickly come see patient in ED   Problem List / ED Course / Critical  interventions / Medication management  Patient presented for abdominal pain.  UA without concern for infection. Lipase within normal limits. CBC without anemia or leukocytosis. CMP with mild hyponatremia. BUN/Cr and HFP reassuring. CT scan showing increasing size right common iliac artery aneurysm with acute hemorrhage and rupture. I was able to talk with Dr. Chestine Spore from vascular surgery quickly and he was able to send a vascular provider to assess patient. Patient started on esmolol and taken to OR. Of note, apparently patient became unresponsive while in CT after esmolol bolus. Dr. Karene Fry was able to see patient and patient recovered.  I have reviewed the patients home medicines and have made adjustments as needed    Social Determinants of Health:  none           Final Clinical Impression(s) / ED Diagnoses Final diagnoses:  Ruptured aneurysm of common iliac artery [facility])    Rx / DC Orders ED Discharge Orders     None         Dorthy Cooler, [place] 07/30/23 1155    Ernie Avena, MD 08/01/23 1119

## 2023-07-30 NOTE — Anesthesia Procedure Notes (Signed)
Procedure Name: Intubation Date/Time: 07/30/2023 12:03 PM  Performed by: Alwyn Ren, CRNAPre-anesthesia Checklist: Patient identified, Emergency Drugs available, Suction available and Patient being monitored Patient Re-evaluated:Patient Re-evaluated prior to induction Oxygen Delivery Method: Circle system utilized Preoxygenation: Pre-oxygenation with 100% oxygen Induction Type: IV induction Ventilation: Mask ventilation without difficulty Laryngoscope Size: Miller and 2 Grade View: Grade II Tube type: Oral Tube size: 7.5 mm Number of attempts: 2 Airway Equipment and Method: Stylet and Oral airway Placement Confirmation: ETT inserted through vocal cords under direct vision, positive ETCO2 and breath sounds checked- equal and bilateral Secured at: 23 cm Tube secured with: Tape Dental Injury: Teeth and Oropharynx as per pre-operative assessment  Comments: 2-b view

## 2023-07-30 NOTE — ED Notes (Signed)
MD Lawsing advised goal SBP 100-110. Pharmacist bedside guiding drug titration.

## 2023-07-30 NOTE — ED Notes (Signed)
Blood admin e-consent obtained.

## 2023-07-30 NOTE — Anesthesia Postprocedure Evaluation (Signed)
Anesthesia Post Note  Patient: Salvotore Peyser  Procedure(s) Performed: ABDOMINAL AORTIC ENDOVASCULAR STENT GRAFT ULTRASOUND GUIDANCE FOR VASCULAR ACCESS, BILATREAL FEMORAL ARTERIES (Bilateral: Groin)     Patient location during evaluation: PACU Anesthesia Type: General Level of consciousness: awake and alert Pain management: pain level controlled Vital Signs Assessment: post-procedure vital signs reviewed and stable Respiratory status: spontaneous breathing, nonlabored ventilation, respiratory function stable and patient connected to nasal cannula oxygen Cardiovascular status: blood pressure returned to baseline and stable Postop Assessment: no apparent nausea or vomiting Anesthetic complications: no   No notable events documented.  Last Vitals:  Vitals:   07/30/23 1110 07/30/23 1400  BP: (!) 166/153   Pulse: 75   Resp: 13   Temp:  (!) 36.1 C  SpO2: 94%     Last Pain:  Vitals:   07/30/23 1500  TempSrc:   PainSc: Asleep                 Viola Nation

## 2023-07-30 NOTE — Consult Note (Addendum)
Hospital Consult    Reason for Consult:  Right iliac aneurysm rupture Requesting Physician:  Valrie Hart PA-C MRN #:  [id number]  History of Present Illness: This is a 57 y.o. male with PMH of HTN, DM, HLD, CAD, tobacco abuse, pancreatitis and PAD with hx of left fem- popliteal bypass who presented with RLQ pain yesterday that has progressed overnight. He explains that his pain started suddenly in the RLQ and is now in his right lower back and right hip. He does not describe any injury or change in activity at the time his pain started. He says normally he has discomfort in his legs on ambulation especially when walking up an incline or stairs. The left leg is worse than the right. He has history of left fem- popliteal vein bypass on the left lower extremity at [facility]. He does not have any pain at rest or tissue loss. He is medically managed on Aspirin and statin. On admission he underwent CTA which shoes a ruptured right common iliac artery aneurysm. He denies any knowledge of an Aneurysm. No history of AAA.   Past Medical History:  Diagnosis Date   Arthritis    right elbow pain all the time, took cortisone shot for   CAD (coronary artery disease)    DM (diabetes mellitus) (HCC)    Elevated blood pressure reading with diagnosis of hypertension    GERD (gastroesophageal reflux disease)    Hypertriglyceridemia    PAD (peripheral artery disease) (HCC)    Pancreatitis 12/29/2013   Pancreatitis     Past Surgical History:  Procedure Laterality Date   EUS N/A 08/12/2014   Procedure: ESOPHAGEAL ENDOSCOPIC ULTRASOUND (EUS) RADIAL;  Surgeon: Willis Modena, MD;  Location: [facility];  Service: Endoscopy;  Laterality: N/A;   FINE NEEDLE ASPIRATION N/A 08/12/2014   Procedure: FINE NEEDLE ASPIRATION (FNA) LINEAR;  Surgeon: Willis Modena, MD;  Location: [facility];  Service: Endoscopy;  Laterality: N/A;   IR GENERIC HISTORICAL  01/23/2017   IR US GUIDE VASC ACCESS RIGHT 01/23/2017 [name],  MD [facility]   IR GENERIC HISTORICAL  01/23/2017   IR FLUORO GUIDE CV LINE RIGHT 01/23/2017 [name], MD [facility]   NO PAST SURGERIES      Allergies  Allergen Reactions   Fish Allergy Nausea And Vomiting and Other (See Comments)    Can eat shrimp & scallops...sometimes crab    Prior to Admission medications   Medication Sig Start Date End Date Taking? Authorizing Provider  acetaminophen (TYLENOL) 325 MG tablet Take by mouth.    [provider]  amLODipine (NORVASC) 10 MG tablet Take by mouth. 11/13/19   [provider]  aspirin EC 81 MG EC tablet Take 1 tablet (81 mg total) by mouth daily. 12/20/18   Santos-Sanchez, Chelsea Primus, MD  Blood Glucose Monitoring Suppl (GLUCOCOM BLOOD GLUCOSE MONITOR) DEVI Use meter to check blood sugar twice times a day.  Please dispense based on availability, insurance coverage, and patient preference. 11/12/19   [provider]  fenofibrate (TRICOR) 145 MG tablet Take 1 tablet (145 mg total) by mouth daily. 01/06/19   Santos-Sanchez, Chelsea Primus, MD  glucose blood (PRECISION QID TEST) test strip Use 1 strip to check blood sugar 2 times a day. Please dispense based on availability, insurance coverage, and patient preference. 11/12/19   [provider]  hydrochlorothiazide (HYDRODIURIL) 25 MG tablet Take 1 tablet (25 mg total) by mouth daily. 12/20/21 03/20/22  Nahser, Deloris Ping, MD  potassium chloride (KLOR-CON) 10 MEQ tablet  Take 1 tablet (10 mEq total) by mouth daily. 12/20/21 03/20/22  Nahser, Deloris Ping, MD  rosuvastatin (CRESTOR) 10 MG tablet Take 10 mg by mouth at bedtime. 05/21/20   [provider]  sildenafil (VIAGRA) 50 MG tablet Take by mouth. 11/07/19   [provider]    Social History   Socioeconomic History   Marital status: Married    Spouse name: Not on file   Number of children: Not on file   Years of education: Not on file   Highest education level: Not on file  Occupational History   Not on file   Tobacco Use   Smoking status: Every Day    Current packs/day: 0.50    Average packs/day: 0.5 packs/day for 38.0 years (19.0 ttl pk-yrs)    Types: Cigarettes   Smokeless tobacco: Never  Substance and Sexual Activity   Alcohol use: Yes    Comment: little to no   Drug use: No   Sexual activity: Not on file  Other Topics Concern   Not on file  Social History Narrative   Not on file   Social Determinants of Health   Financial Resource Strain: Not on file  Food Insecurity: Not on file  Transportation Needs: Not on file  Physical Activity: Not on file  Stress: Not on file  Social Connections: Not on file  Intimate Partner Violence: Not on file     Family History  Problem Relation Age of Onset   Other Mother        Healthy   Other Father        Healthy   Pancreatitis Neg Hx     ROS: Otherwise negative unless mentioned in HPI  Physical Examination  Vitals:   07/30/23 0810 07/30/23 0830  BP: (!) 206/134 (!) 187/115  Pulse: (!) 58 69  Resp: (!) 21 19  Temp:    SpO2: 100% 93%   Body mass index is 29.95 kg/m.  General:  WDWN in NAD Gait: Not observed HENT: WNL, normocephalic Pulmonary: normal non-labored breathing, without wheezing Cardiac: regular Abdomen: soft, NT/ND Vascular Exam/Pulses: 2+ femoral pulses bilaterally, no palpable distal pulses, Right PT doppler signal, left DP doppler signal. Feet warm, motor and sensation intact Extremities: without ischemic changes, without Gangrene , without cellulitis; without open wounds;  Musculoskeletal: no muscle wasting or atrophy  Neurologic: A&O X 3;  No focal weakness or paresthesias are detected; speech is fluent/normal Psychiatric:  The pt has Normal affect. Lymph:  Unremarkable  CBC    Component Value Date/Time   WBC 9.9 07/30/2023 0442   RBC 5.90 (H) 07/30/2023 0442   HGB 16.7 07/30/2023 0442   HCT 49.0 07/30/2023 0442   PLT 323 07/30/2023 0442   MCV 83.1 07/30/2023 0442   MCH 28.3 07/30/2023 0442    MCHC 34.1 07/30/2023 0442   RDW 13.2 07/30/2023 0442   LYMPHSABS 3.7 07/30/2023 0442   MONOABS 0.8 07/30/2023 0442   EOSABS 0.1 07/30/2023 0442   BASOSABS 0.0 07/30/2023 0442    BMET    Component Value Date/Time   NA 133 (L) 07/30/2023 0442   K 4.1 07/30/2023 0442   CL 98 07/30/2023 0442   CO2 21 (L) 07/30/2023 0442   GLUCOSE 133 (H) 07/30/2023 0442   BUN 8 07/30/2023 0442   CREATININE 1.04 07/30/2023 0442   CALCIUM 9.5 07/30/2023 0442   GFRNONAA >60 07/30/2023 0442   GFRAA >60 12/19/2018 0303    COAGS: No results found for: "INR", "PROTIME"  Non-Invasive Vascular Imaging:   CT Abdomen Pelvis with Contrast: IMPRESSION: Increasing size right common iliac artery aneurysm with acute hemorrhage and rupture. Significant hematoma in the surrounding soft tissues, extending into the presacral space and involving the right psoas muscle. 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   No bowel obstruction. Left-sided colonic diverticulosis. Normal appendix.   Right-sided inguinal hernia with involvement of loops of bowel. No obstruction.   Fatty liver infiltration.  Statin:  Yes.   Beta Blocker:  Yes.   Aspirin:  Yes.   ACEI:  No. ARB:  No. CCB use:  Yes Other antiplatelets/anticoagulants:  No.    ASSESSMENT/PLAN: This is a 57 y.o. male with PMH of HTN, DM, HLD, CAD, tobacco abuse, pancreatitis and PAD with hx of left fem- popliteal bypass who presented with RLQ pain yesterday that has progressed overnight. CTA abdomen pelvis with ruptured right common iliac artery aneurysm. He will need to be emergently taken to the OR for endovascular stent graft repair. Patient seen with Dr. Hetty Blend. Risks/ benefits and alternatives to the surgery were discussed with patient including bleeding, arterial injury, and need for additional procedures.  The  patient agrees to proceed with the procedure. Patient is NPO since yesterday. He will be brought emergently to OR for repair by Dr. Chestine Spore Dr. Hetty Blend.  Graceann Congress PA-C [facility] [phone] 07/30/2023  10:38 AM  I have seen and evaluated the patient. I agree with the PA note as documented above.  57 year old male presents with acute onset abdominal pain that started yesterday.  CT in the ED shows ruptured right common iliac aneurysm that measured 4.3 cm.  I have recommended proceeding emergently to the operating room for stent graft repair.  Discussed likely will have to coil his right hypogastric artery in order to get seal.  His left hypogastric appears patent.  Did discuss pelvic ischemia along with other risk benefits.  He has good femoral pulses.  He has Doppler signals in his feet.  He has had a left femoropopliteal at [facility] that is occluded.  Cephus Shelling, MD [facility] [place] Office: [phone]

## 2023-07-30 NOTE — Transfer of Care (Signed)
Immediate Anesthesia Transfer of Care Note  Patient: Jordan [name]  Procedure(s) Performed: ABDOMINAL AORTIC ENDOVASCULAR STENT GRAFT ULTRASOUND GUIDANCE FOR VASCULAR ACCESS, BILATREAL FEMORAL ARTERIES (Bilateral: Groin)  Patient Location: PACU  Anesthesia Type:General  Level of Consciousness: awake, alert , and oriented  Airway & Oxygen Therapy: Patient Spontanous Breathing and Patient connected to face mask oxygen  Post-op Assessment: Report given to RN and Post -op Vital signs reviewed and stable  Post vital signs: Reviewed and stable  Last Vitals:  Vitals Value Taken Time  BP 156/99 07/30/23 1400  Temp    Pulse 78 07/30/23 1402  Resp 17 07/30/23 1402  SpO2 100 % 07/30/23 1402  Vitals shown include unfiled device data.  Last Pain:  Vitals:   07/30/23 1117  TempSrc:   PainSc: 8          Complications: No notable events documented.

## 2023-07-31 ENCOUNTER — Encounter (HOSPITAL_COMMUNITY): Payer: Self-pay | Admitting: Vascular Surgery

## 2023-07-31 ENCOUNTER — Other Ambulatory Visit: Payer: Self-pay | Admitting: Physician Assistant

## 2023-07-31 DIAGNOSIS — Z8679 Personal history of other diseases of the circulatory system: Secondary | ICD-10-CM

## 2023-07-31 DIAGNOSIS — I739 Peripheral vascular disease, unspecified: Secondary | ICD-10-CM

## 2023-07-31 LAB — BASIC METABOLIC PANEL
Anion gap: 13 (ref 5–15)
BUN: 8 mg/dL (ref 6–20)
CO2: 23 mmol/L (ref 22–32)
Calcium: 9.2 mg/dL (ref 8.9–10.3)
Chloride: 96 mmol/L — ABNORMAL LOW (ref 98–111)
Creatinine, Ser: 0.91 mg/dL (ref 0.61–1.24)
GFR, Estimated: >60 mL/min (ref >60)
Glucose, Bld: 115 mg/dL — ABNORMAL HIGH (ref 70–99)
Potassium: 3.8 mmol/L (ref 3.5–5.1)
Sodium: 132 mmol/L — ABNORMAL LOW (ref 135–145)

## 2023-07-31 LAB — POCT I-STAT 7, (LYTES, BLD GAS, ICA,H+H)
Acid-base deficit: 5 mmol/L — ABNORMAL HIGH (ref 0.0–2.0)
Bicarbonate: 21.8 mmol/L (ref 20.0–28.0)
Calcium, Ion: 1.19 mmol/L (ref 1.15–1.40)
HCT: 42 % (ref 39.0–52.0)
Hemoglobin: 14.3 g/dL (ref 13.0–17.0)
O2 Saturation: 100 %
Patient temperature: 36.1
Potassium: 4 mmol/L (ref 3.5–5.1)
Sodium: 134 mmol/L — ABNORMAL LOW (ref 135–145)
TCO2: 23 mmol/L (ref 22–32)
pCO2 arterial: 42.3 mmHg (ref 32–48)
pH, Arterial: 7.317 — ABNORMAL LOW (ref 7.35–7.45)
pO2, Arterial: 300 mmHg — ABNORMAL HIGH (ref 83–108)

## 2023-07-31 LAB — CBC
HCT: 39.5 % (ref 39.0–52.0)
Hemoglobin: 13.2 g/dL (ref 13.0–17.0)
MCH: 27.4 pg (ref 26.0–34.0)
MCHC: 33.4 g/dL (ref 30.0–36.0)
MCV: 82.1 fL (ref 80.0–100.0)
Platelets: 234 10*3/uL (ref 150–400)
RBC: 4.81 MIL/uL (ref 4.22–5.81)
RDW: 13.4 % (ref 11.5–15.5)
WBC: 9.7 10*3/uL (ref 4.0–10.5)
nRBC: 0 % (ref 0.0–0.2)

## 2023-07-31 LAB — GLUCOSE, CAPILLARY
Glucose-Capillary: 153 mg/dL — ABNORMAL HIGH (ref 70–99)
Glucose-Capillary: 110 mg/dL — ABNORMAL HIGH (ref 70–99)
Glucose-Capillary: 107 mg/dL — ABNORMAL HIGH (ref 70–99)

## 2023-07-31 LAB — POCT ACTIVATED CLOTTING TIME
Activated Clotting Time: 244 s
Activated Clotting Time: 256 s

## 2023-07-31 IMAGING — CT CT CHEST LUNG CANCER SCREENING LOW DOSE W/O CM
1 series · 10 of 10 positions shown, 13 images · non-contrast
Comparison: Chest CT dated March 03, 2021

CLINICAL DATA: Current smoker with 35 pack-year history

EXAM:
CT CHEST WITHOUT CONTRAST LOW-DOSE FOR LUNG CANCER SCREENING
TECHNIQUE: Multidetector CT imaging of the chest was performed following the
standard protocol without IV contrast.

[ct lung segmentation data · axial · 0.69mm/px · z∈[-373,-373]mm · 10 of 327 frames shown]
[frame 1/327  mediastinal]
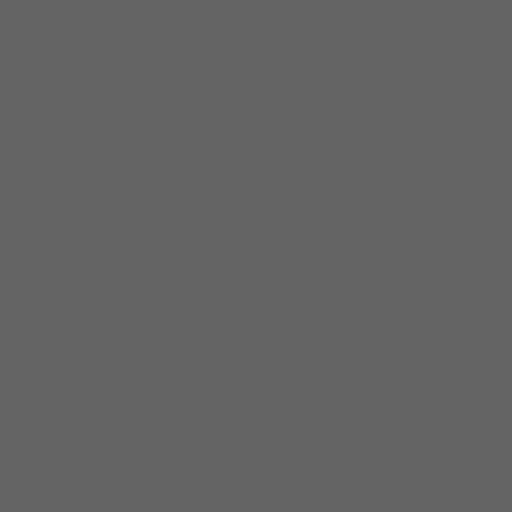
[frame 1/327  lung]
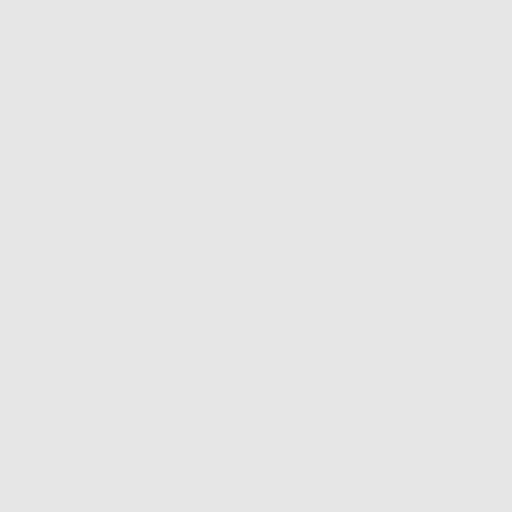
[frame 37/327  lung]
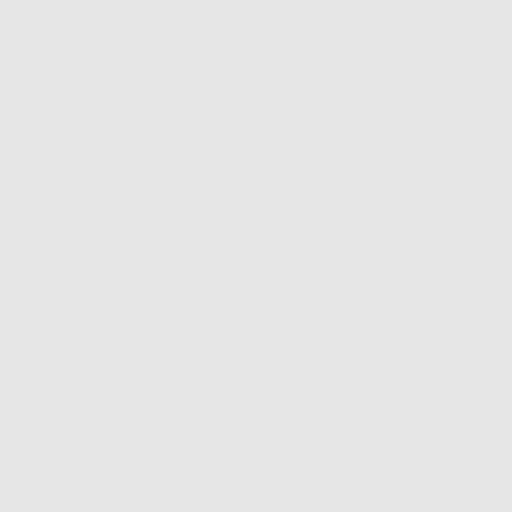
[frame 73/327  lung]
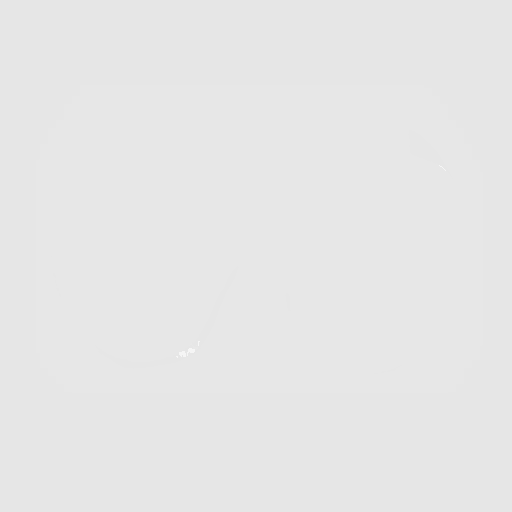
[frame 109/327  lung]
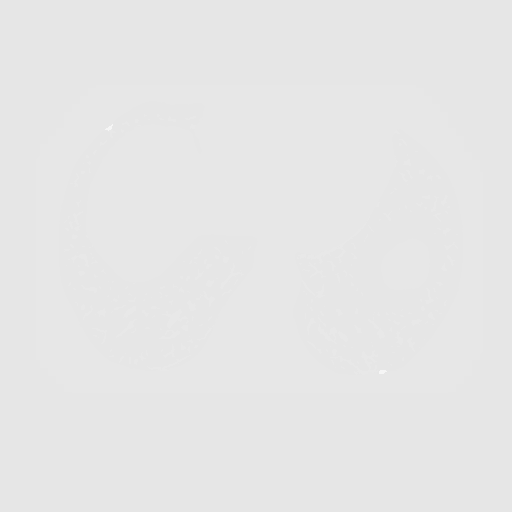
[frame 145/327  mediastinal]
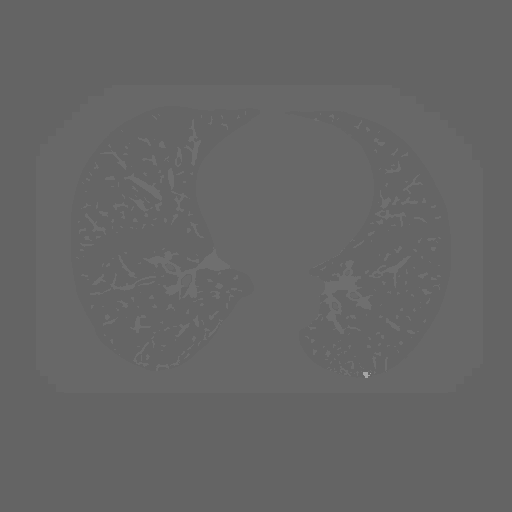
[frame 145/327  lung]
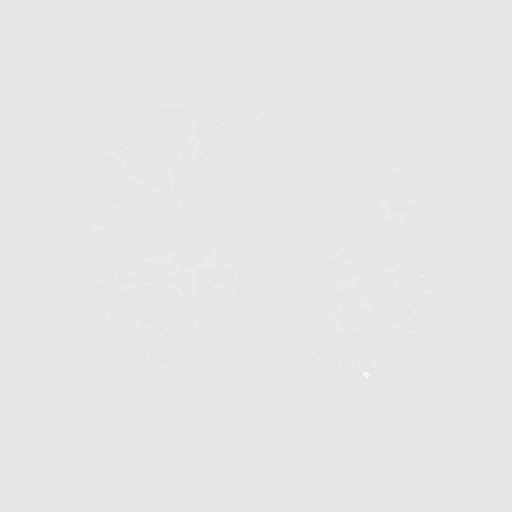
[frame 182/327  lung]
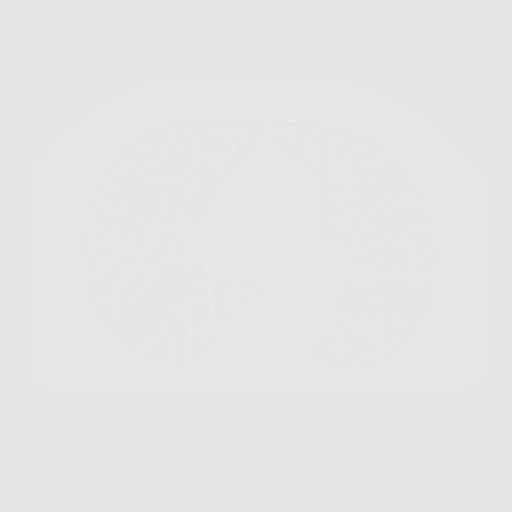
[frame 218/327  lung]
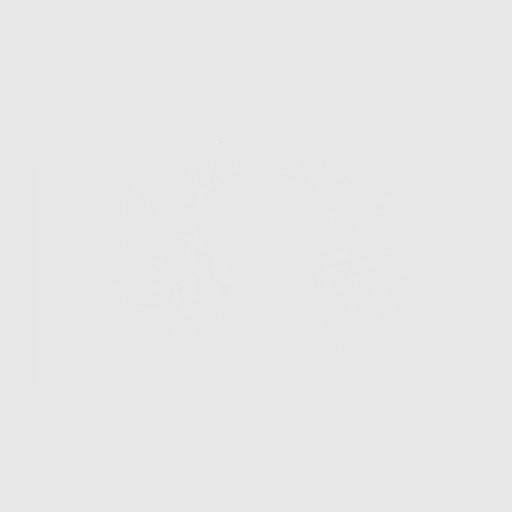
[frame 254/327  lung]
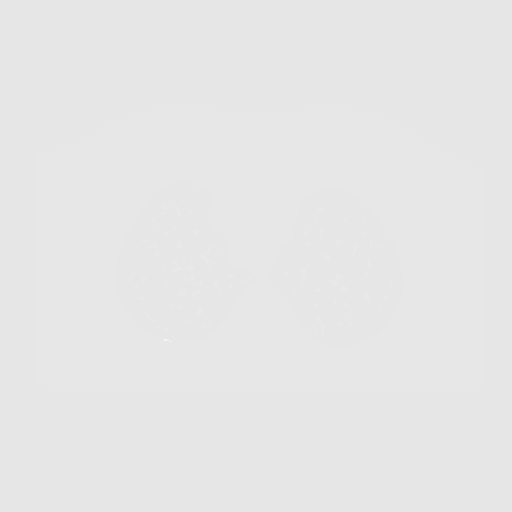
[frame 290/327  mediastinal]
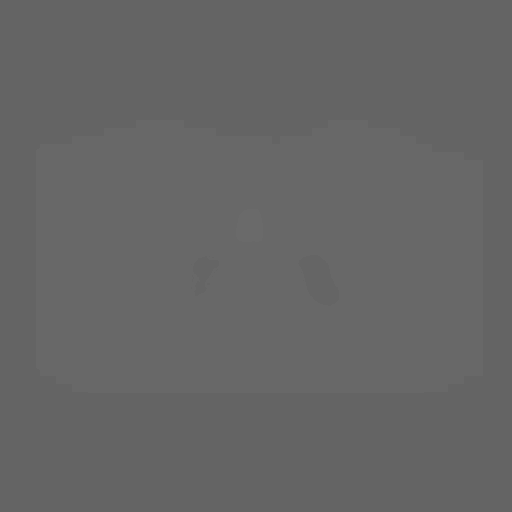
[frame 290/327  lung]
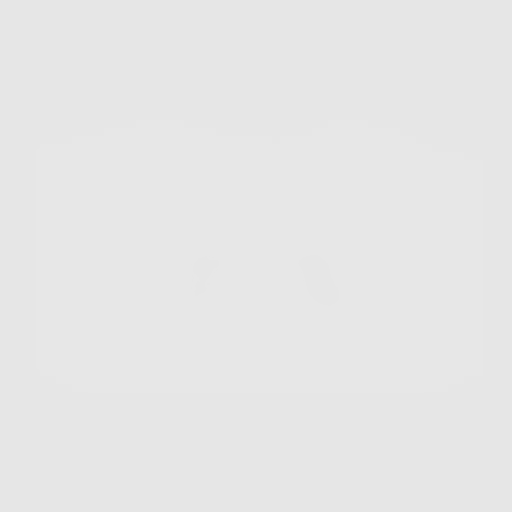
[frame 327/327  lung]
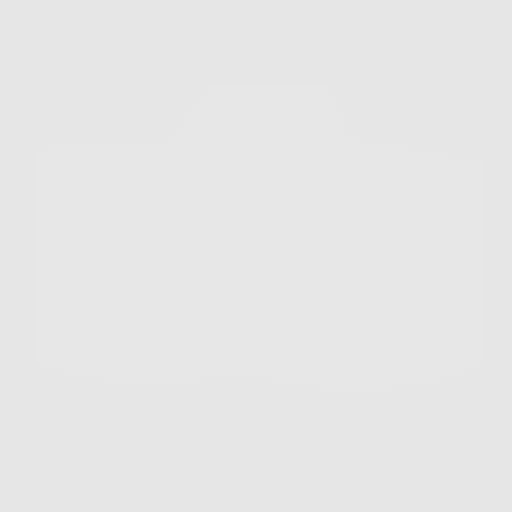

[10 of 10 positions shown; findings below may reference images not displayed]

FINDINGS: Cardiovascular: Normal heart size. No pericardial effusion.
Three-vessel coronary artery calcifications. No atherosclerotic
disease of the thoracic aorta.

Mediastinum/Nodes: Esophagus and thyroid are unremarkable. No
pathologically enlarged lymph nodes seen in the chest.

Lungs/Pleura: Central airways are patent. Upper lobe predominant
paraseptal emphysema. No consolidation, pleural effusion or
pneumothorax. Small scattered solid pulmonary nodules. Largest
measures 3.6 cm and is located on image 126.

Upper Abdomen: Hepatic steatosis.  No acute abnormality.

Musculoskeletal: No chest wall mass or suspicious bone lesions
identified.
IMPRESSION: 1. Lung-RADS 2S, Benign appearance or behavior. Continue annual
screening with low-dose chest CT without contrast in 12 months. S
modifier for coronary artery calcifications.
2. Three-vessel coronary artery calcifications, recommend ASCVD risk
assessment.
3. Emphysema ([id number]).

## 2023-07-31 MED ORDER — OXYCODONE-ACETAMINOPHEN 5-325 MG PO TABS
1.0000 | ORAL_TABLET | Freq: Four times a day (QID) | ORAL | 0 refills | Status: DC | PRN
Start: 2023-07-31 — End: 2023-09-11

## 2023-07-31 MED ORDER — OXYCODONE-ACETAMINOPHEN 5-325 MG PO TABS: 1.0000 | ORAL_TABLET | Freq: Four times a day (QID) | ORAL | 0 refills | Status: DC | PRN

## 2023-07-31 MED ORDER — OXYCODONE-ACETAMINOPHEN 5-325 MG PO TABS: 1.0000 | ORAL_TABLET | ORAL | 0 refills | Status: DC | PRN

## 2023-07-31 NOTE — Progress Notes (Signed)
[facility]  Subjective  - no complaints.  Abdominal pain much better.   Objective (!) 143/96 64 98.4 F (36.9 C) (Oral) (!) 22 98%  Intake/Output Summary (Last 24 hours) at 07/31/2023 0702 Last data filed at 07/31/2023 0600 Gross per 24 hour  Intake 1779.67 ml  Output 2800 ml  Net -1020.33 ml    Abdomen soft with no significant tenderness.  No rebound or guarding Bilateral groins without hematoma Bilateral PT Doppler signals  Laboratory Lab Results: Recent Labs    07/30/23 1620 07/31/23 0438  WBC 11.1* 9.7  HGB 13.5 13.2  HCT 39.1 39.5  PLT 259 234   BMET Recent Labs    07/30/23 1400 07/30/23 1620 07/31/23 0438  NA 129*  --  132*  K 4.2  --  3.8  CL 101  --  96*  CO2 21*  --  23  GLUCOSE 142*  --  115*  BUN 7  --  8  CREATININE 0.86 0.76 0.91  CALCIUM 8.4*  --  9.2    COAG Lab Results  Component Value Date   INR 1.3 (H) 07/30/2023   INR 1.1 07/30/2023   No results found for: "PTT"  Assessment/Planning:  Postop day 1 status post aortobiiliac stent graft for ruptured right common iliac artery aneurysm.  Abdominal pain now resolved.  Resting comfortably in the ICU.  No significant abdominal pain.  Labs reassuring today with creatinine 0.91 and hemoglobin stable at 13.2.  White count is normal.  Discussed we will allow him to eat breakfast and mobilize and hopefully discharge later today.  Continue aspirin.  Will arrange follow-up in 1 month with CTA.  He feels ready for discharge.  Jordan [name] 07/31/2023 7:02 AM --

## 2023-07-31 NOTE — Plan of Care (Signed)
  Problem: Education: Goal: Ability to describe self-care measures that may prevent or decrease complications (Diabetes Survival Skills Education) will improve Outcome: Progressing   Problem: Coping: Goal: Ability to adjust to condition or change in health will improve Outcome: Progressing   

## 2023-07-31 NOTE — Discharge Instructions (Signed)
  [facility] [place]   Discharge Instructions  Endovascular Aortic Aneurysm Repair  Please refer to the following instructions for your post-procedure care. Your surgeon or Physician Assistant will discuss any changes with you.  Activity  You are encouraged to walk as much as you can. You can slowly return to normal activities but must avoid strenuous activity and heavy lifting until your doctor tells you it's OK. Avoid activities such as vacuuming or swinging a gold club. It is normal to feel tired for several weeks after your surgery. Do not drive until your doctor gives the OK and you are no longer taking prescription pain medications. It is also normal to have difficulty with sleep habits, eating, and bowel movements after surgery. These will go away with time.  Bathing/Showering  Shower daily after you go home.  Do not soak in a bathtub, hot tub, or swim until the incision heals completely.  If you have incisions in your groin, wash the groin wounds with soap and water daily and pat dry. (No tub bath-only shower)  Then put a dry gauze or washcloth there to keep this area dry to help prevent wound infection daily and as needed.  Do not use Vaseline or neosporin on your incisions.  Only use soap and water on your incisions and then protect and keep dry.  Incision Care  Shower every day. Clean your incision with mild soap and water. Pat the area dry with a clean towel. You do not need a bandage unless otherwise instructed. Do not apply any ointments or creams to your incision. If you clothing is irritating, you may cover your incision with a dry gauze pad.  Diet  Resume your normal diet. There are no special food restrictions following this procedure. A low fat/low cholesterol diet is recommended for all patients with vascular disease. In order to heal from your surgery, it is CRITICAL to get adequate nutrition. Your body requires vitamins, minerals, and protein.  Vegetables are the best source of vitamins and minerals. Vegetables also provide the perfect balance of protein. Processed food has little nutritional value, so try to avoid this.  Medications  Resume taking all of your medications unless your doctor or nurse practitioner tells you not to. If your incision is causing pain, you may take over-the-counter pain relievers such as acetaminophen (Tylenol). If you were prescribed a stronger pain medication, please be aware these medications can cause nausea and constipation. Prevent nausea by taking the medication with a snack or meal. Avoid constipation by drinking plenty of fluids and eating foods with a high amount of fiber, such as fruits, vegetables, and grains.  Do not take Tylenol if you are taking prescription pain medications.   Follow up  Our office will schedule a follow-up appointment with a CT scan 3-4 weeks after your surgery.  Please call us immediately for any of the following conditions  Severe or worsening pain in your legs or feet or in your abdomen back or chest. Increased pain, redness, drainage (pus) from your incision site. Increased abdominal pain, bloating, nausea, vomiting or persistent diarrhea. Fever of 101 degrees or higher. Swelling in your leg (s),  Reduce your risk of vascular disease  Stop smoking. If you would like help call [facility] at 1-800-QUIT-NOW (1-800-784-8669) or [place] at [phone]. Manage your cholesterol Maintain a desired weight Control your diabetes Keep your blood pressure down  If you have questions, please call the office at [phone].  

## 2023-07-31 NOTE — Progress Notes (Signed)
      Regarding the following patient:  Jordan [name] DOB: [birth year]   To whoever it may concern, the patient listed above recently required emergent repair of a ruptured abdominal aneurysm on 07/30/2023. He is medically stable and being discharged from the hospital on 07/31/2023.   He should not return to work until Monday, September 16th 2024 to allow his body to recover. He is cleared to lift greater than 10 pounds (if required) two weeks after his surgical date.   Loel Dubonnet, PA-C  [facility] [place] [phone] 07/31/2023

## 2023-07-31 NOTE — Plan of Care (Signed)
  Problem: Education: Goal: Ability to describe self-care measures that may prevent or decrease complications (Diabetes Survival Skills Education) will improve 07/31/2023 1150 by Zakhi Dupre, [place] L, RN Outcome: Completed/Met 07/31/2023 0830 by Arisbel Maione, [place] L, RN Outcome: Progressing Goal: Individualized Educational Video(s) Outcome: Completed/Met   Problem: Coping: Goal: Ability to adjust to condition or change in health will improve 07/31/2023 1150 by Shylyn Younce, [place] L, RN Outcome: Completed/Met 07/31/2023 0830 by Ulyssa Walthour, [place] L, RN Outcome: Progressing   Problem: Fluid Volume: Goal: Ability to maintain a balanced intake and output will improve Outcome: Completed/Met   Problem: Health Behavior/Discharge Planning: Goal: Ability to identify and utilize available resources and services will improve Outcome: Completed/Met Goal: Ability to manage health-related needs will improve Outcome: Completed/Met   Problem: Metabolic: Goal: Ability to maintain appropriate glucose levels will improve Outcome: Completed/Met   Problem: Nutritional: Goal: Maintenance of adequate nutrition will improve Outcome: Completed/Met Goal: Progress toward achieving an optimal weight will improve Outcome: Completed/Met   Problem: Skin Integrity: Goal: Risk for impaired skin integrity will decrease Outcome: Completed/Met   Problem: Tissue Perfusion: Goal: Adequacy of tissue perfusion will improve Outcome: Completed/Met   Problem: Education: Goal: Knowledge of discharge needs will improve Outcome: Completed/Met   Problem: Clinical Measurements: Goal: Postoperative complications will be avoided or minimized Outcome: Completed/Met   Problem: Respiratory: Goal: Ability to achieve and maintain a regular respiratory rate will improve Outcome: Completed/Met   Problem: Skin Integrity: Goal: Demonstration of wound healing without infection will improve Outcome: Completed/Met   Problem:  Education: Goal: Knowledge of General Education information will improve Description: Including pain rating scale, medication(s)/side effects and non-pharmacologic comfort measures Outcome: Completed/Met   Problem: Health Behavior/Discharge Planning: Goal: Ability to manage health-related needs will improve Outcome: Completed/Met   Problem: Clinical Measurements: Goal: Ability to maintain clinical measurements within normal limits will improve Outcome: Completed/Met Goal: Will remain free from infection Outcome: Completed/Met Goal: Diagnostic test results will improve Outcome: Completed/Met Goal: Respiratory complications will improve Outcome: Completed/Met Goal: Cardiovascular complication will be avoided Outcome: Completed/Met   Problem: Activity: Goal: Risk for activity intolerance will decrease Outcome: Completed/Met   Problem: Nutrition: Goal: Adequate nutrition will be maintained Outcome: Completed/Met   Problem: Coping: Goal: Level of anxiety will decrease Outcome: Completed/Met   Problem: Elimination: Goal: Will not experience complications related to bowel motility Outcome: Completed/Met Goal: Will not experience complications related to urinary retention Outcome: Completed/Met   Problem: Pain Managment: Goal: General experience of comfort will improve Outcome: Completed/Met   Problem: Safety: Goal: Ability to remain free from injury will improve Outcome: Completed/Met   Problem: Skin Integrity: Goal: Risk for impaired skin integrity will decrease Outcome: Completed/Met

## 2023-07-31 NOTE — Progress Notes (Signed)
PIVs removed. Discharge AVS given, all questions answered. Follow up appointment and discharge instructions discussed. Education provided. Prescriptions to be picked up at [facility] pharmacy, patient and wife aware.  Transported to front lobby with wife and Therapist, occupational.

## 2023-08-02 LAB — BPAM RBC
Blood Product Expiration Date: 202409292359
Blood Product Expiration Date: 202409292359
Blood Product Expiration Date: 202409292359
Blood Product Expiration Date: 202409292359
ISSUE DATE / TIME: 202409091224
ISSUE DATE / TIME: 202409091224
Unit Type and Rh: 6200
Unit Type and Rh: 6200
Unit Type and Rh: 6200
Unit Type and Rh: 6200

## 2023-08-02 LAB — TYPE AND SCREEN
ABO/RH(D): A POS
Antibody Screen: NEGATIVE
Unit division: 0
Unit division: 0
Unit division: 0
Unit division: 0

## 2023-08-05 NOTE — Discharge Summary (Signed)
EVAR Discharge Summary   Jordan [name] [birth year] 57 y.o. male  MRN: [id number]  Admission Date: 07/30/2023  Discharge Date: 07/31/2023  Physician: Sherald Hess MD  Admission Diagnosis: S/P abdominal aortic aneurysm repair [Z98.890, Z86.79] Ruptured aneurysm of common iliac artery [facility]) [I72.3]  Discharge Day Diagnosis: S/P abdominal aortic aneurysm repair [Z98.890, Z86.79] Ruptured aneurysm of common iliac artery [facility]) [I72.3]  Hospital Course:  The patient was admitted to the hospital and emergently taken to the operating room on 07/30/2023 and underwent: aortobi-iliac stent graft for a ruptured right common iliac artery aneurysm. Intra-operatively he was given 2U PRBCs. He tolerated the procedure well and was transferred to the PACU in stable condition.   Post op day 1 the patient was stable and doing well. His abdominal pain was resolved. He was hemodynamically stable with a hemoglobin of 13.2. His creatinine was normal at 0.91. He was tolerating a normal diet and ambulating without issue.  He was discharged home on POD1.  CBC    Component Value Date/Time   WBC 9.7 07/31/2023 0438   RBC 4.81 07/31/2023 0438   HGB 13.2 07/31/2023 0438   HCT 39.5 07/31/2023 0438   PLT 234 07/31/2023 0438   MCV 82.1 07/31/2023 0438   MCH 27.4 07/31/2023 0438   MCHC 33.4 07/31/2023 0438   RDW 13.4 07/31/2023 0438   LYMPHSABS 3.7 07/30/2023 0442   MONOABS 0.8 07/30/2023 0442   EOSABS 0.1 07/30/2023 0442   BASOSABS 0.0 07/30/2023 0442    BMET    Component Value Date/Time   NA 132 (L) 07/31/2023 0438   K 3.8 07/31/2023 0438   CL 96 (L) 07/31/2023 0438   CO2 23 07/31/2023 0438   GLUCOSE 115 (H) 07/31/2023 0438   BUN 8 07/31/2023 0438   CREATININE 0.91 07/31/2023 0438   CALCIUM 9.2 07/31/2023 0438   GFRNONAA >60 07/31/2023 0438   GFRAA >60 12/19/2018 0303       Discharge Instructions     Call MD for:  redness, tenderness, or signs of infection (pain, swelling,  redness, odor or green/yellow discharge around incision site)   Complete by: As directed    Call MD for:  severe uncontrolled pain   Complete by: As directed    Call MD for:  temperature >100.4   Complete by: As directed    Diet - low sodium heart healthy   Complete by: As directed    Increase activity slowly   Complete by: As directed    Remove dressing in 48 hours   Complete by: As directed        Discharge Diagnosis:  S/P abdominal aortic aneurysm repair [W09.811, Z86.79] 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) [I72.3]  Secondary Diagnosis: Patient Active Problem List   Diagnosis Date Noted   S/P abdominal aortic aneurysm repair 07/30/2023   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) 07/30/2023   PAD (peripheral artery disease) (HCC) 07/05/2021   Pancreatic lesion 12/16/2018   Essential hypertension, benign 01/26/2017   Hyperkalemia 01/22/2017   Left knee pain 01/21/2017   Other acute pancreatitis without necrosis or infection 08/15/2016   Elevated blood pressure reading with diagnosis of hypertension 08/15/2016   Hypertriglyceridemia 12/31/2013   Abdominal pain 12/29/2013   Nausea and vomiting 12/29/2013   Tobacco use 12/29/2013   Leukocytosis 12/29/2013   Past Medical History:  Diagnosis Date   Arthritis    right elbow pain all the time, took cortisone shot for   CAD (coronary artery disease)    DM (  diabetes mellitus) (HCC)    Elevated blood pressure reading with diagnosis of hypertension    GERD (gastroesophageal reflux disease)    Hypertriglyceridemia    PAD (peripheral artery disease) (HCC)    Pancreatitis 12/29/2013   Pancreatitis      Allergies as of 07/31/2023       Reactions   Fish Allergy Nausea And Vomiting, Other (See Comments)   Can eat shrimp & scallops...sometimes crab        Medication List     TAKE these medications    acetaminophen 325 MG tablet Commonly known as: TYLENOL Take by mouth.   amLODipine 10 MG  tablet Commonly known as: NORVASC Take by mouth.   aspirin EC 81 MG tablet Take 1 tablet (81 mg total) by mouth daily.   fenofibrate 145 MG tablet Commonly known as: Tricor Take 1 tablet (145 mg total) by mouth daily.   GlucoCom Blood Glucose Monitor Devi Use meter to check blood sugar twice times a day.  Please dispense based on availability, insurance coverage, and patient preference.   hydrochlorothiazide 25 MG tablet Commonly known as: HYDRODIURIL Take 1 tablet (25 mg total) by mouth daily.   potassium chloride 10 MEQ tablet Commonly known as: KLOR-CON Take 1 tablet (10 mEq total) by mouth daily.   Precision QID Test test strip Generic drug: glucose blood Use 1 strip to check blood sugar 2 times a day. Please dispense based on availability, insurance coverage, and patient preference.   rosuvastatin 10 MG tablet Commonly known as: CRESTOR Take 10 mg by mouth at bedtime.   sildenafil 50 MG tablet Commonly known as: VIAGRA Take by mouth. Notes to patient: Not given during this admission        Discharge Instructions:   [facility]  Discharge Instructions Endovascular Aortic Aneurysm Repair  Please refer to the following instructions for your post-procedure care. Your surgeon or Physician Assistant will discuss any changes with you.  Activity  You are encouraged to walk as much as you can. You can slowly return to normal activities but must avoid strenuous activity and heavy lifting until your doctor tells you it's OK. Avoid activities such as vacuuming or swinging a gold club. It is normal to feel tired for several weeks after your surgery. Do not drive until your doctor gives the OK and you are no longer taking prescription pain medications. It is also normal to have difficulty with sleep habits, eating, and bowel movements after surgery. These will go away with time.  Bathing/Showering  You may shower after you go home. If you  have an incision, do not soak in a bathtub, hot tub, or swim until the incision heals completely.  Incision Care  Shower every day. Clean your incision with mild soap and water. Pat the area dry with a clean towel. You do not need a bandage unless otherwise instructed. Do not apply any ointments or creams to your incision. If you clothing is irritating, you may cover your incision with a dry gauze pad.  Diet  Resume your normal diet. There are no special food restrictions following this procedure. A low fat/low cholesterol diet is recommended for all patients with vascular disease. In order to heal from your surgery, it is CRITICAL to get adequate nutrition. Your body requires vitamins, minerals, and protein. Vegetables are the best source of vitamins and minerals. Vegetables also provide the perfect balance of protein. Processed food has little nutritional value, so try to avoid this.  Medications  Resume taking all of your medications unless your doctor or Physician Assistant tells you not to. If your incision is causing pain, you may take over-the-counter pain relievers such as acetaminophen (Tylenol). If you were prescribed a stronger pain medication, please be aware these medications can cause nausea and constipation. Prevent nausea by taking the medication with a snack or meal. Avoid constipation by drinking plenty of fluids and eating foods with a high amount of fiber, such as fruits, vegetables, and grains. Do not take Tylenol if you are taking prescription pain medications.   Follow up  Our office will schedule a follow-up appointment with a C.T. scan 3-4 weeks after your surgery.  Please call us immediately for any of the following conditions  Severe or worsening pain in your legs or feet or in your abdomen back or chest. Increased pain, redness, drainage (pus) from your incision sit. Increased abdominal pain, bloating, nausea, vomiting or persistent diarrhea. Fever of 101 degrees  or higher. Swelling in your leg (s),  Reduce your risk of vascular disease  Stop smoking. If you would like help call [facility] at 1-800-QUIT-NOW (276-846-3500) or [place] at [phone]. Manage your cholesterol Maintain a desired weight Control your diabetes Keep your blood pressure down  If you have questions, please call the office at [phone].   Disposition: Home  Patient's condition: is Good  Follow up: 1. Dr. Chestine Spore in 4 weeks with CTA protocol   Loel Dubonnet, PA-C [facility] [phone] 08/05/2023  7:37 PM   - For VQI Registry use - Post-op:  Time to Extubation: [x]  In OR, [ ]  < 12 hrs, [ ]  12-24 hrs, [ ]  >=24 hrs Vasopressors Req. Post-op: No MI: No., [ ]  Troponin only, [ ]  EKG or Clinical New Arrhythmia: No CHF: No ICU Stay: 1 day  Transfusion: No     If yes,  units given  Complications: Resp failure: No., [ ]  Pneumonia, [ ]  Ventilator Chg in renal function: No., [ ]  Inc. Cr > 0.5, [ ]  Temp. Dialysis,  [ ]  Permanent dialysis Leg ischemia: No., no Surgery needed, [ ]  Yes, Surgery needed,  [ ]  Amputation Bowel ischemia: No., [ ]  Medical Rx, [ ]  Surgical Rx Wound complication: No., [ ]  Superficial separation/infection, [ ]  Return to OR Return to OR: No  Return to OR for bleeding: No Stroke: No., [ ]  Minor, [ ]  Major  Discharge medications: Statin use:  Yes  ASA use:  Yes  Plavix use:  No  Beta blocker use:  No  ARB use:  No ACEI use:  No CCB use:  Yes

## 2023-08-09 ENCOUNTER — Other Ambulatory Visit: Payer: Self-pay

## 2023-08-09 DIAGNOSIS — I723 Aneurysm of iliac artery: Secondary | ICD-10-CM

## 2023-08-29 ENCOUNTER — Ambulatory Visit
Admission: RE | Admit: 2023-08-29 | Discharge: 2023-08-29 | Disposition: A | Discharge status: Home / Self Care | Payer: Managed Care, Other (non HMO) | Source: Ambulatory Visit | Attending: Vascular Surgery | Admitting: Vascular Surgery

## 2023-08-29 DIAGNOSIS — I723 Aneurysm of iliac artery: Secondary | ICD-10-CM

## 2023-08-29 MED ORDER — IOPAMIDOL (ISOVUE-370) INJECTION 76%
75.0000 mL | Freq: Once | INTRAVENOUS | Status: AC | PRN
  Administered 2023-08-29: 75 mL via INTRAVENOUS

## 2023-09-04 ENCOUNTER — Ambulatory Visit (INDEPENDENT_AMBULATORY_CARE_PROVIDER_SITE_OTHER): Payer: Managed Care, Other (non HMO) | Admitting: Vascular Surgery

## 2023-09-04 ENCOUNTER — Encounter: Payer: Self-pay | Admitting: Vascular Surgery

## 2023-09-04 VITALS — BP 133/89 | HR 65 | Temp 98.0°F | Resp 18 | Ht 65.0 in | Wt 177.2 lb

## 2023-09-04 DIAGNOSIS — I723 Aneurysm of iliac artery: Secondary | ICD-10-CM

## 2023-09-04 NOTE — Progress Notes (Signed)
Patient name: Jordan [name] MRN: [id number] DOB: 1966-07-28 Sex: male  REASON FOR CONSULT: Postop check after EVAR for ruptured right common iliac artery aneurysm  HPI: Jordan [name] is a 57 y.o. male, with history of CAD, diabetes, hypertension, hyperlipidemia that presents for postop check after EVAR for ruptured right common iliac artery aneurysm.  Patient presented on 07/30/2023 with a 4.3 cm ruptured right common iliac aneurysm and underwent aortobiiliac stent graft.  He did well and was discharged home.  No abdominal or pelvic pain.  Also of note he has a history of left common femoral to below-knee popliteal bypass with vein by Dr. Willette Pa at [facility] on 06/13/2019 for claudication.  This is known to be occluded.  Past Medical History:  Diagnosis Date   Arthritis    right elbow pain all the time, took cortisone shot for   CAD (coronary artery disease)    DM (diabetes mellitus) (HCC)    Elevated blood pressure reading with diagnosis of hypertension    GERD (gastroesophageal reflux disease)    Hypertriglyceridemia    PAD (peripheral artery disease) (HCC)    Pancreatitis 12/29/2013   Pancreatitis     Past Surgical History:  Procedure Laterality Date   ABDOMINAL AORTIC ENDOVASCULAR STENT GRAFT N/A 07/30/2023   Procedure: ABDOMINAL AORTIC ENDOVASCULAR STENT GRAFT;  Surgeon: Cephus Shelling, MD;  Location: [facility] OR;  Service: Vascular;  Laterality: N/A;   EUS N/A 08/12/2014   Procedure: ESOPHAGEAL ENDOSCOPIC ULTRASOUND (EUS) RADIAL;  Surgeon: Willis Modena, MD;  Location: [facility] END[facility]COPY;  Service: Endoscopy;  Laterality: N/A;   FINE NEEDLE ASPIRATION N/A 08/12/2014   Procedure: FINE NEEDLE ASPIRATION (FNA) LINEAR;  Surgeon: Willis Modena, MD;  Location: [facility];  Service: Endoscopy;  Laterality: N/A;   IR GENERIC HISTORICAL  01/23/2017   IR US GUIDE VASC ACCESS RIGHT 01/23/2017 [name], MD [facility]   IR GENERIC HISTORICAL  01/23/2017   IR FLUORO GUIDE CV LINE RIGHT 01/23/2017  [name], MD [facility]   NO PAST SURGERIES     ULTRASOUND GUIDANCE FOR VASCULAR ACCESS Bilateral 07/30/2023   Procedure: ULTRASOUND GUIDANCE FOR VASCULAR ACCESS, BILATREAL FEMORAL ARTERIES;  Surgeon: Cephus Shelling, MD;  Location: [facility] OR;  Service: Vascular;  Laterality: Bilateral;    Family History  Problem Relation Age of Onset   Other Mother        Healthy   Other Father        Healthy   Pancreatitis Neg Hx     SOCIAL HISTORY: Social History   Socioeconomic History   Marital status: Married    Spouse name: Not on file   Number of children: Not on file   Years of education: Not on file   Highest education level: Not on file  Occupational History   Not on file  Tobacco Use   Smoking status: Every Day    Current packs/day: 0.50    Average packs/day: 0.5 packs/day for 38.0 years (19.0 ttl pk-yrs)    Types: Cigarettes   Smokeless tobacco: Never  Substance and Sexual Activity   Alcohol use: Yes    Comment: little to no   Drug use: No   Sexual activity: Not on file  Other Topics Concern   Not on file  Social History Narrative   Not on file   Social Determinants of Health   Financial Resource Strain: Not on file  Food Insecurity: No Food Insecurity (07/30/2023)   Hunger Vital Sign    Worried About  Running Out of Food in the Last Year: Never true    Ran Out of Food in the Last Year: Never true  Transportation Needs: No Transportation Needs (07/30/2023)   PRAPARE - Administrator, Civil Service (Medical): No    Lack of Transportation (Non-Medical): No  Physical Activity: Not on file  Stress: Not on file  Social Connections: Not on file  Intimate Partner Violence: Not At Risk (07/30/2023)   Humiliation, Afraid, Rape, and Kick questionnaire    Fear of Current or Ex-Partner: No    Emotionally Abused: No    Physically Abused: No    Sexually Abused: No    Allergies  Allergen Reactions   Fish Allergy Nausea And Vomiting and Other (See Comments)     Can eat shrimp & scallops...sometimes crab    Current Outpatient Medications  Medication Sig Dispense Refill   acetaminophen (TYLENOL) 325 MG tablet Take by mouth.     amLODipine (NORVASC) 10 MG tablet Take by mouth.     aspirin EC 81 MG EC tablet Take 1 tablet (81 mg total) by mouth daily. 30 tablet 0   Blood Glucose Monitoring Suppl (GLUCOCOM BLOOD GLUCOSE MONITOR) DEVI Use meter to check blood sugar twice times a day.  Please dispense based on availability, insurance coverage, and patient preference.     fenofibrate (TRICOR) 145 MG tablet Take 1 tablet (145 mg total) by mouth daily. 30 tablet 0   glucose blood (PRECISION QID TEST) test strip Use 1 strip to check blood sugar 2 times a day. Please dispense based on availability, insurance coverage, and patient preference.     hydrochlorothiazide (HYDRODIURIL) 25 MG tablet Take 1 tablet (25 mg total) by mouth daily. 90 tablet 3   oxyCODONE-acetaminophen (PERCOCET/ROXICET) 5-325 MG tablet Take 1 tablet by mouth every 6 (six) hours as needed. 30 tablet 0   oxyCODONE-acetaminophen (PERCOCET/ROXICET) 5-325 MG tablet Take 1 tablet by mouth every 6 (six) hours as needed. 30 tablet 0   potassium chloride (KLOR-CON) 10 MEQ tablet Take 1 tablet (10 mEq total) by mouth daily. 90 tablet 3   rosuvastatin (CRESTOR) 10 MG tablet Take 10 mg by mouth at bedtime.     sildenafil (VIAGRA) 50 MG tablet Take by mouth.     No current facility-administered medications for this visit.    REVIEW OF SYSTEMS:  [X]  denotes positive finding, [ ]  denotes negative finding Cardiac  Comments:  Chest pain or chest pressure:    Shortness of breath upon exertion:    Short of breath when lying flat:    Irregular heart rhythm:        Vascular    Pain in calf, thigh, or hip brought on by ambulation: x   Pain in feet at night that wakes you up from your sleep:     Blood clot in your veins:    Leg swelling:         Pulmonary    Oxygen at home:    Productive cough:      Wheezing:         Neurologic    Sudden weakness in arms or legs:     Sudden numbness in arms or legs:     Sudden onset of difficulty speaking or slurred speech:    Temporary loss of vision in one eye:     Problems with dizziness:         Gastrointestinal    Blood in stool:     Vomited blood:  Genitourinary    Burning when urinating:     Blood in urine:        Psychiatric    Major depression:         Hematologic    Bleeding problems:    Problems with blood clotting too easily:        Skin    Rashes or ulcers:        Constitutional    Fever or chills:      PHYSICAL EXAM: There were no vitals filed for this visit.  GENERAL: The patient is a well-nourished male, in no acute distress. The vital signs are documented above. CARDIAC: There is a regular rate and rhythm.  VASCULAR:  Bilateral femoral pulses palpable No groin hematoma Bilateral feet are warm with no tissue loss Right DP PT monophasic Left DP PT monophasic ABDOMEN: Soft and non-tender. MUSCULOSKELETAL: There are no major deformities or cyanosis. NEUROLOGIC: No focal weakness or paresthesias are detected. PSYCHIATRIC: The patient has a normal affect.  DATA:   CTA reviewed from 08/29/2023 with aortobiiliac stent graft in good position and exclusion of right common iliac aneurysm.  Assessment/Plan:   57 y.o. male, with history of CAD, diabetes, hypertension, hyperlipidemia, PAD that presents for postop check after EVAR for ruptured right common iliac artery aneurysm.  Patient presented on 07/30/2023 with a 4.3 cm ruptured right common iliac aneurysm and underwent aortobiiliac stent graft.  I reviewed his CTA from 08/29/2023 and stent graft is in good position with exclusion of ruptured right common iliac aneurysm.  I agree with radiology that the pelvic retroperitoneal hematoma is improving.  His groins look fine and he has palpable femoral pulses.  He has no abdominal or pelvic pain.  His main complaint  now is claudication and he has a known history of PAD with claudication.  He has undergone a failed left common femoral to below-knee popliteal bypass at [facility] with Dr. Willette Pa in 2020.  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  I will see him in 3 months with repeat ABIs.   Cephus Shelling, MD [facility] [place] Office: [phone]

## 2023-09-05 ENCOUNTER — Other Ambulatory Visit: Payer: Self-pay | Admitting: Internal Medicine

## 2023-09-05 DIAGNOSIS — Z87891 Personal history of nicotine dependence: Secondary | ICD-10-CM

## 2023-09-11 ENCOUNTER — Encounter: Payer: Self-pay | Admitting: Internal Medicine

## 2023-09-11 ENCOUNTER — Ambulatory Visit: Payer: Managed Care, Other (non HMO) | Attending: Internal Medicine | Admitting: Internal Medicine

## 2023-09-11 DIAGNOSIS — I10 Essential (primary) hypertension: Secondary | ICD-10-CM | POA: Diagnosis not present

## 2023-09-11 DIAGNOSIS — E119 Type 2 diabetes mellitus without complications: Secondary | ICD-10-CM | POA: Diagnosis not present

## 2023-09-11 DIAGNOSIS — I739 Peripheral vascular disease, unspecified: Secondary | ICD-10-CM

## 2023-09-11 DIAGNOSIS — E783 Hyperchylomicronemia: Secondary | ICD-10-CM | POA: Diagnosis not present

## 2023-09-11 DIAGNOSIS — E785 Hyperlipidemia, unspecified: Secondary | ICD-10-CM

## 2023-09-11 MED ORDER — ROSUVASTATIN CALCIUM 40 MG PO TABS
40.0000 mg | ORAL_TABLET | Freq: Every day | ORAL | 3 refills | Status: AC
Start: 2023-09-11 — End: 2023-12-10

## 2023-09-11 MED ORDER — FENOFIBRATE 160 MG PO TABS: 160.0000 mg | ORAL_TABLET | Freq: Every day | ORAL | 3 refills | Status: DC

## 2023-09-11 NOTE — Progress Notes (Signed)
Virtual Visit via Video Note   Because of Jordan [name]'s co-morbid illnesses, he is at least at moderate risk for complications without adequate follow up.  This format is felt to be most appropriate for this patient at this time.  All issues noted in this document were discussed and addressed.  A limited physical exam was performed with this format.  Please refer to the patient's chart for his consent to telehealth for [facility].      Date:  09/11/2023   ID:  Jordan [name], DOB 1965/11/22, MRN [id number] The patient was identified using 2 identifiers.  Evaluation Performed:  New Patient Evaluation  Patient Location:  [address] [place] [address]  Provider location:   [address], [address] [place], [place] [address]  PCP:  Melida Quitter, MD  Cardiologist:  None Electrophysiologist:  None   Chief Complaint:  Manage dyslipidemia  History of Present Illness:    Jordan [name] is a 57 y.o. male who presents via audio/video conferencing for a telehealth visit today.  This is a pleasant 57 year old male with an extensive family history of coronary artery disease as well as personal history of coronary calcification, hypertension, type 2 diabetes, PAD and history of high triglycerides with prior episodes of pancreatitis.  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  He has previously been seen for cardiovascular risk factor modification by Dr. Elease Hashimoto.  This is primarily for hypertension management.  He was referred to me for lipid management due to ongoing high triglycerides.  He has a history of this dating for many years suggesting this is likely genetic.  His triglycerides have been greater than 3000 in the past.  Recent lipid testing however showed that his triglycerides were in the 1300-1500 range.  He said he is never been able to get them much lower.   Current regimen includes fenofibrate 145 mg daily and rosuvastatin 10 mg daily.  He has a fish allergy therefore cannot take fish oil or derivative products.  He reports diet is mostly focused on lowering carbohydrates but tries to watch saturated fats.  Prior CV studies:   The following studies were reviewed today:  Chart reviewed, labwork  PMHx:  Past Medical History:  Diagnosis Date   Arthritis    right elbow pain all the time, took cortisone shot for   CAD (coronary artery disease)    DM (diabetes mellitus) (HCC)    Elevated blood pressure reading with diagnosis of hypertension    GERD (gastroesophageal reflux disease)    Hypertriglyceridemia    PAD (peripheral artery disease) (HCC)    Pancreatitis 12/29/2013   Pancreatitis     Past Surgical History:  Procedure Laterality Date   ABDOMINAL AORTIC ENDOVASCULAR STENT GRAFT N/A 07/30/2023   Procedure: ABDOMINAL AORTIC ENDOVASCULAR STENT GRAFT;  Surgeon: Cephus Shelling, MD;  Location: [facility] OR;  Service: Vascular;  Laterality: N/A;   EUS N/A 08/12/2014   Procedure: ESOPHAGEAL ENDOSCOPIC ULTRASOUND (EUS) RADIAL;  Surgeon: Willis Modena, MD;  Location: [facility] ENDOSCOPY;  Service: Endoscopy;  Laterality: N/A;   FINE NEEDLE ASPIRATION N/A 08/12/2014   Procedure: FINE NEEDLE ASPIRATION (FNA) LINEAR;  Surgeon: Willis Modena, MD;  Location: [facility] ENDOSCOPY;  Service: Endoscopy;  Laterality: N/A;   IR GENERIC HISTORICAL  01/23/2017   IR US GUIDE VASC ACCESS RIGHT 01/23/2017 [name], MD [facility]INTER[facility] RAD   IR GENERIC HISTORICAL  01/23/2017  IR FLUORO GUIDE CV LINE RIGHT 01/23/2017 [name], MD [facility]   NO PAST SURGERIES     ULTRASOUND GUIDANCE FOR VASCULAR ACCESS Bilateral 07/30/2023   Procedure: ULTRASOUND GUIDANCE FOR VASCULAR ACCESS, BILATREAL FEMORAL ARTERIES;  Surgeon: Cephus Shelling, MD;  Location: [facility] OR;  Service: Vascular;  Laterality: Bilateral;    FAMHx:  Family History  Problem Relation Age of Onset   Other Mother         Healthy   Other Father        Healthy   Pancreatitis Neg Hx     SOCHx:   reports that he quit smoking about 3 weeks ago. His smoking use included cigarettes. He has a 19 pack-year smoking history. He has never used smokeless tobacco. He reports current alcohol use. He reports that he does not use drugs.  ALLERGIES:  Allergies  Allergen Reactions   Fish Allergy Nausea And Vomiting and Other (See Comments)    Can eat shrimp & scallops...sometimes crab    MEDS:  Current Meds  Medication Sig   acetaminophen (TYLENOL) 325 MG tablet Take by mouth.   ALPRAZolam (XANAX PO) Take by mouth in the morning and at bedtime.   amLODipine (NORVASC) 10 MG tablet Take by mouth.   aspirin EC 81 MG EC tablet Take 1 tablet (81 mg total) by mouth daily.   Blood Glucose Monitoring Suppl (GLUCOCOM BLOOD GLUCOSE MONITOR) DEVI Use meter to check blood sugar twice times a day.  Please dispense based on availability, insurance coverage, and patient preference.   fenofibrate 160 MG tablet Take 1 tablet (160 mg total) by mouth daily.   glucose blood (PRECISION QID TEST) test strip Use 1 strip to check blood sugar 2 times a day. Please dispense based on availability, insurance coverage, and patient preference.   rosuvastatin (CRESTOR) 40 MG tablet Take 1 tablet (40 mg total) by mouth daily.   sildenafil (VIAGRA) 50 MG tablet Take by mouth.   [DISCONTINUED] fenofibrate (TRICOR) 145 MG tablet Take 1 tablet (145 mg total) by mouth daily.   [DISCONTINUED] rosuvastatin (CRESTOR) 10 MG tablet Take 10 mg by mouth at bedtime.     ROS: Pertinent items noted in HPI and remainder of comprehensive ROS otherwise negative.  Labs/Other Tests and Data Reviewed:    Recent Labs: 07/30/2023: ALT 39; Magnesium 1.7 07/31/2023: BUN 8; Creatinine, Ser 0.91; Hemoglobin 13.2; Platelets 234; Potassium 3.8; Sodium 132   Recent Lipid Panel Lab Results  Component Value Date/Time   CHOL 321 (H) 12/19/2018 03:03 AM   TRIG 547 (H)  12/19/2018 03:03 AM   HDL 28 (L) 12/19/2018 03:03 AM   CHOLHDL 11.5 12/19/2018 03:03 AM   LDLCALC UNABLE TO CALCULATE IF TRIGLYCERIDE OVER 400 mg/dL 57/84/6962 95:28 AM   LDLDIRECT 182 (H) 12/19/2018 08:51 AM    Wt Readings from Last 3 Encounters:  09/04/23 177 lb 3.2 oz (80.4 kg)  07/30/23 180 lb (81.6 kg)  10/26/22 180 lb (81.6 kg)     Exam:    Vital Signs:  There were no vitals taken for this visit.   General appearance: alert and no distress Lungs: no visual respiratory difficulty   Abdomen: mildly overweight Extremities: extremities normal, atraumatic, no cyanosis or edema Skin: Skin color, texture, turgor normal. 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 Montanari has probable hyper chylomicronemia with very high triglycerides and a prior history of pancreatitis.  He also has coronary artery disease including calcifications although no has not had a prior coronary event.  He has PAD and had a recent ruptured aneurysm that required repair.  He needs aggressive lipid-lowering and by guidelines to be on high potency statin therapy.  I would advise increasing his rosuvastatin from 10 to 40 mg daily.  In addition we will increase his fenofibrate from 145 to 160 mg daily.  Unfortunately he has a fish allergy, therefore is not a candidate for Lovaza or Vascepa.  He may be a candidate for an upcoming clinical trial and we will reach out to him if he were to qualify for that.  Otherwise plan repeat lipids fasting in about 3 to 4 months.  Have encouraged increased physical activity and diet low in saturated fats.  Thanks again for the kind referral.  Time:   Today, I have spent 25 minutes with the patient with telehealth technology  discussing high triglycerides.     Medication Adjustments/Labs and Tests Ordered: Current medicines are reviewed at length with the patient today.  Concerns regarding medicines are outlined above.   Tests Ordered: Orders Placed This Encounter  Procedures   LDL cholesterol, direct   Apolipoprotein B   NMR, lipoprofile    Medication Changes: Meds ordered this encounter  Medications   rosuvastatin (CRESTOR) 40 MG tablet    Sig: Take 1 tablet (40 mg total) by mouth daily.    Dispense:  90 tablet    Refill:  3   fenofibrate 160 MG tablet    Sig: Take 1 tablet (160 mg total) by mouth daily.    Dispense:  90 tablet    Refill:  3    Disposition:  in 3 month(s)  Chrystie Nose, MD, [facility], FACP  [place]  [facility]  Medical Director of the Advanced Lipid Disorders &  Cardiovascular Risk Reduction Clinic Diplomate of the American Board of Clinical Lipidology Attending Cardiologist  Direct Dial: [phone]  Fax: [phone]  Website:  [email or web address]  Chrystie Nose, MD  09/11/2023 8:37 AM

## 2023-09-11 NOTE — Patient Instructions (Addendum)
Medication Instructions:  INCREASE rosuvastatin to 40mg  daily INCREASE fenofibrate to 160mg  daily  New prescriptions sent to [facility]  *If you need a refill on your cardiac medications before your next appointment, please call your pharmacy*   Lab Work: FASTING lab work in about 3-4 months to check cholesterol   If you have labs (blood work) drawn today and your tests are completely normal, you will receive your results only by: MyChart Message (if you have MyChart) OR A paper copy in the mail If you have any lab test that is abnormal or we need to change your treatment, we will call you to review the results.  Follow-Up: At [facility], you and your health needs are our priority.  As part of our continuing mission to provide you with exceptional heart care, we have created designated Provider Care Teams.  These Care Teams include your primary Cardiologist (physician) and Advanced Practice Providers (APPs -  Physician Assistants and Nurse Practitioners) who all work together to provide you with the care you need, when you need it.  We recommend signing up for the patient portal called "MyChart".  Sign up information is provided on this After Visit Summary.  MyChart is used to connect with patients for Virtual Visits (Telemedicine).  Patients are able to view lab/test results, encounter notes, upcoming appointments, etc.  Non-urgent messages can be sent to your provider as well.   To learn more about what you can do with MyChart, go to [email or web address].    Your next appointment:   3-4 months with Dr. Rennis Golden

## 2023-09-28 ENCOUNTER — Other Ambulatory Visit: Payer: Self-pay

## 2023-09-28 DIAGNOSIS — I739 Peripheral vascular disease, unspecified: Secondary | ICD-10-CM

## 2023-11-21 DEATH — deceased

## 2023-12-04 ENCOUNTER — Ambulatory Visit (HOSPITAL_COMMUNITY): Payer: Self-pay

## 2023-12-04 ENCOUNTER — Ambulatory Visit: Payer: Managed Care, Other (non HMO) | Admitting: Vascular Surgery

## 2023-12-25 ENCOUNTER — Encounter: Payer: Self-pay | Admitting: Internal Medicine

## 2024-01-29 ENCOUNTER — Ambulatory Visit: Payer: Self-pay | Admitting: Internal Medicine
